# Patient Record
Sex: Male | Born: 1945 | Race: Black or African American | Hispanic: No | Marital: Married | Smoking: Never smoker
Health system: Southern US, Community
[De-identification: ages and names within clinical notes are randomized; demographics above are authoritative.]

## PROBLEM LIST (undated history)

## (undated) DIAGNOSIS — E785 Hyperlipidemia, unspecified: Secondary | ICD-10-CM

## (undated) DIAGNOSIS — N400 Enlarged prostate without lower urinary tract symptoms: Secondary | ICD-10-CM

## (undated) DIAGNOSIS — E559 Vitamin D deficiency, unspecified: Secondary | ICD-10-CM

## (undated) DIAGNOSIS — C61 Malignant neoplasm of prostate: Secondary | ICD-10-CM

## (undated) DIAGNOSIS — I10 Essential (primary) hypertension: Secondary | ICD-10-CM

## (undated) DIAGNOSIS — R55 Syncope and collapse: Secondary | ICD-10-CM

## (undated) DIAGNOSIS — M109 Gout, unspecified: Secondary | ICD-10-CM

## (undated) DIAGNOSIS — M1990 Unspecified osteoarthritis, unspecified site: Secondary | ICD-10-CM

## (undated) DIAGNOSIS — F4310 Post-traumatic stress disorder, unspecified: Secondary | ICD-10-CM

## (undated) DIAGNOSIS — K219 Gastro-esophageal reflux disease without esophagitis: Secondary | ICD-10-CM

## (undated) HISTORY — PX: PROSTATE SURGERY: SHX751

## (undated) HISTORY — PX: OTHER SURGICAL HISTORY: SHX169

---

## 2002-09-04 ENCOUNTER — Encounter: Payer: Self-pay | Admitting: Family Medicine

## 2002-09-04 ENCOUNTER — Encounter: Admission: RE | Admit: 2002-09-04 | Discharge: 2002-09-04 | Payer: Self-pay | Admitting: Family Medicine

## 2002-09-28 ENCOUNTER — Ambulatory Visit (HOSPITAL_COMMUNITY): Admission: RE | Admit: 2002-09-28 | Discharge: 2002-09-28 | Payer: Self-pay | Admitting: Orthopedic Surgery

## 2002-10-02 ENCOUNTER — Ambulatory Visit (HOSPITAL_COMMUNITY): Admission: RE | Admit: 2002-10-02 | Discharge: 2002-10-02 | Payer: Self-pay | Admitting: Orthopedic Surgery

## 2002-10-02 ENCOUNTER — Encounter: Payer: Self-pay | Admitting: Orthopedic Surgery

## 2009-03-02 ENCOUNTER — Emergency Department (HOSPITAL_COMMUNITY): Admission: EM | Admit: 2009-03-02 | Discharge: 2009-03-02 | Payer: Self-pay | Admitting: Emergency Medicine

## 2013-08-13 DIAGNOSIS — C61 Malignant neoplasm of prostate: Secondary | ICD-10-CM

## 2013-08-13 HISTORY — DX: Malignant neoplasm of prostate: C61

## 2013-08-13 HISTORY — PX: PROSTATE BIOPSY: SHX241

## 2013-12-29 ENCOUNTER — Inpatient Hospital Stay (HOSPITAL_COMMUNITY)
Admission: EM | Admit: 2013-12-29 | Discharge: 2013-12-31 | DRG: 813 | Disposition: A | Discharge status: Home / Self Care | Payer: Medicare Other | Attending: Internal Medicine | Admitting: Internal Medicine

## 2013-12-29 ENCOUNTER — Encounter (HOSPITAL_COMMUNITY): Payer: Self-pay | Admitting: Emergency Medicine

## 2013-12-29 DIAGNOSIS — R809 Proteinuria, unspecified: Secondary | ICD-10-CM

## 2013-12-29 DIAGNOSIS — F4310 Post-traumatic stress disorder, unspecified: Secondary | ICD-10-CM | POA: Diagnosis present

## 2013-12-29 DIAGNOSIS — E876 Hypokalemia: Secondary | ICD-10-CM | POA: Diagnosis present

## 2013-12-29 DIAGNOSIS — M1990 Unspecified osteoarthritis, unspecified site: Secondary | ICD-10-CM | POA: Diagnosis present

## 2013-12-29 DIAGNOSIS — E785 Hyperlipidemia, unspecified: Secondary | ICD-10-CM | POA: Diagnosis present

## 2013-12-29 DIAGNOSIS — R21 Rash and other nonspecific skin eruption: Secondary | ICD-10-CM

## 2013-12-29 DIAGNOSIS — I10 Essential (primary) hypertension: Secondary | ICD-10-CM | POA: Diagnosis present

## 2013-12-29 DIAGNOSIS — Z79899 Other long term (current) drug therapy: Secondary | ICD-10-CM

## 2013-12-29 DIAGNOSIS — R17 Unspecified jaundice: Secondary | ICD-10-CM | POA: Diagnosis present

## 2013-12-29 DIAGNOSIS — D692 Other nonthrombocytopenic purpura: Secondary | ICD-10-CM | POA: Diagnosis present

## 2013-12-29 DIAGNOSIS — M109 Gout, unspecified: Secondary | ICD-10-CM | POA: Diagnosis present

## 2013-12-29 DIAGNOSIS — E871 Hypo-osmolality and hyponatremia: Secondary | ICD-10-CM | POA: Diagnosis present

## 2013-12-29 DIAGNOSIS — K219 Gastro-esophageal reflux disease without esophagitis: Secondary | ICD-10-CM | POA: Diagnosis present

## 2013-12-29 DIAGNOSIS — R945 Abnormal results of liver function studies: Secondary | ICD-10-CM

## 2013-12-29 DIAGNOSIS — D696 Thrombocytopenia, unspecified: Secondary | ICD-10-CM | POA: Diagnosis present

## 2013-12-29 DIAGNOSIS — C61 Malignant neoplasm of prostate: Secondary | ICD-10-CM | POA: Diagnosis present

## 2013-12-29 DIAGNOSIS — R7989 Other specified abnormal findings of blood chemistry: Secondary | ICD-10-CM | POA: Diagnosis present

## 2013-12-29 DIAGNOSIS — N049 Nephrotic syndrome with unspecified morphologic changes: Secondary | ICD-10-CM | POA: Insufficient documentation

## 2013-12-29 HISTORY — DX: Unspecified osteoarthritis, unspecified site: M19.90

## 2013-12-29 HISTORY — DX: Gastro-esophageal reflux disease without esophagitis: K21.9

## 2013-12-29 HISTORY — DX: Post-traumatic stress disorder, unspecified: F43.10

## 2013-12-29 HISTORY — DX: Benign prostatic hyperplasia without lower urinary tract symptoms: N40.0

## 2013-12-29 HISTORY — DX: Gout, unspecified: M10.9

## 2013-12-29 HISTORY — DX: Essential (primary) hypertension: I10

## 2013-12-29 HISTORY — DX: Vitamin D deficiency, unspecified: E55.9

## 2013-12-29 HISTORY — DX: Hyperlipidemia, unspecified: E78.5

## 2013-12-29 HISTORY — DX: Syncope and collapse: R55

## 2013-12-29 HISTORY — DX: Malignant neoplasm of prostate: C61

## 2013-12-29 LAB — CBC WITH DIFFERENTIAL/PLATELET
BASOS PCT: 1 % (ref 0–1)
Basophils Absolute: 0 10*3/uL (ref 0.0–0.1)
EOS ABS: 0.2 10*3/uL (ref 0.0–0.7)
EOS PCT: 3 % (ref 0–5)
HCT: 38.3 % — ABNORMAL LOW (ref 39.0–52.0)
HEMOGLOBIN: 13.7 g/dL (ref 13.0–17.0)
LYMPHS ABS: 1.8 10*3/uL (ref 0.7–4.0)
Lymphocytes Relative: 21 % (ref 12–46)
MCH: 34.5 pg — AB (ref 26.0–34.0)
MCHC: 35.8 g/dL (ref 30.0–36.0)
MCV: 96.5 fL (ref 78.0–100.0)
MONO ABS: 1 10*3/uL (ref 0.1–1.0)
MONOS PCT: 12 % (ref 3–12)
NEUTROS PCT: 63 % (ref 43–77)
Neutro Abs: 5.5 10*3/uL (ref 1.7–7.7)
Platelets: 140 10*3/uL — ABNORMAL LOW (ref 150–400)
RBC: 3.97 MIL/uL — AB (ref 4.22–5.81)
RDW: 12.7 % (ref 11.5–15.5)
WBC: 8.5 10*3/uL (ref 4.0–10.5)

## 2013-12-29 LAB — COMPREHENSIVE METABOLIC PANEL
ALBUMIN: 3.2 g/dL — AB (ref 3.5–5.2)
ALK PHOS: 170 U/L — AB (ref 39–117)
ALT: 20 U/L (ref 0–53)
ANION GAP: 15 (ref 5–15)
AST: 48 U/L — ABNORMAL HIGH (ref 0–37)
BUN: 14 mg/dL (ref 6–23)
CALCIUM: 9.2 mg/dL (ref 8.4–10.5)
CO2: 27 mEq/L (ref 19–32)
CREATININE: 0.79 mg/dL (ref 0.50–1.35)
Chloride: 90 mEq/L — ABNORMAL LOW (ref 96–112)
GFR calc non Af Amer: >90 mL/min (ref >90)
GLUCOSE: 92 mg/dL (ref 70–99)
Potassium: 3 mEq/L — ABNORMAL LOW (ref 3.7–5.3)
Sodium: 132 mEq/L — ABNORMAL LOW (ref 137–147)
TOTAL PROTEIN: 9.1 g/dL — AB (ref 6.0–8.3)
Total Bilirubin: 1.7 mg/dL — ABNORMAL HIGH (ref 0.3–1.2)

## 2013-12-29 LAB — URINE MICROSCOPIC-ADD ON

## 2013-12-29 LAB — URINALYSIS, ROUTINE W REFLEX MICROSCOPIC
Glucose, UA: NEGATIVE mg/dL
Ketones, ur: NEGATIVE mg/dL
LEUKOCYTES UA: NEGATIVE
NITRITE: NEGATIVE
PH: 6 (ref 5.0–8.0)
Protein, ur: 30 mg/dL — AB
SPECIFIC GRAVITY, URINE: 1.01 (ref 1.005–1.030)
UROBILINOGEN UA: 4 mg/dL — AB (ref 0.0–1.0)

## 2013-12-29 LAB — CK: Total CK: 61 U/L (ref 7–232)

## 2013-12-29 LAB — HEPATITIS PANEL, ACUTE
HCV AB: NEGATIVE
Hep A IgM: NONREACTIVE
Hep B C IgM: NONREACTIVE
Hepatitis B Surface Ag: NEGATIVE

## 2013-12-29 LAB — MAGNESIUM: MAGNESIUM: 1.5 mg/dL (ref 1.5–2.5)

## 2013-12-29 LAB — VITAMIN B12: Vitamin B-12: 358 pg/mL (ref 211–911)

## 2013-12-29 LAB — TSH: TSH: 1.03 u[IU]/mL (ref 0.350–4.500)

## 2013-12-29 LAB — RHEUMATOID FACTOR: Rhuematoid fact SerPl-aCnc: <10 IU/mL (ref <=14)

## 2013-12-29 LAB — HIV ANTIBODY (ROUTINE TESTING W REFLEX): HIV: NONREACTIVE

## 2013-12-29 LAB — SEDIMENTATION RATE: SED RATE: 82 mm/h — AB (ref 0–16)

## 2013-12-29 LAB — RPR

## 2013-12-29 MED ORDER — ATORVASTATIN CALCIUM 80 MG PO TABS
80.0000 mg | ORAL_TABLET | Freq: Every day | ORAL | Status: DC
  Administered 2013-12-29: 80 mg via ORAL
  Filled 2013-12-29 (×3): qty 1

## 2013-12-29 MED ORDER — VERAPAMIL HCL 120 MG PO TABS
120.0000 mg | ORAL_TABLET | Freq: Every day | ORAL | Status: DC
  Administered 2013-12-30 – 2013-12-31 (×2): 120 mg via ORAL
  Filled 2013-12-29 (×2): qty 1

## 2013-12-29 MED ORDER — ALBUTEROL SULFATE (2.5 MG/3ML) 0.083% IN NEBU: 2.5000 mg | INHALATION_SOLUTION | RESPIRATORY_TRACT | Status: DC | PRN

## 2013-12-29 MED ORDER — FAMOTIDINE 20 MG PO TABS
20.0000 mg | ORAL_TABLET | Freq: Every day | ORAL | Status: DC
Start: 2013-12-29 — End: 2013-12-31
  Administered 2013-12-30 – 2013-12-31 (×2): 20 mg via ORAL
  Filled 2013-12-29 (×2): qty 1

## 2013-12-29 MED ORDER — SODIUM CHLORIDE 0.9 % IV SOLN: INTRAVENOUS | Status: DC

## 2013-12-29 MED ORDER — METHYLPREDNISOLONE SODIUM SUCC 125 MG IJ SOLR
125.0000 mg | INTRAMUSCULAR | Status: AC
  Administered 2013-12-29: 125 mg via INTRAVENOUS
  Filled 2013-12-29: qty 2

## 2013-12-29 MED ORDER — ACETAMINOPHEN 325 MG PO TABS: 650.0000 mg | ORAL_TABLET | Freq: Four times a day (QID) | ORAL | Status: DC | PRN

## 2013-12-29 MED ORDER — POTASSIUM CHLORIDE IN NACL 20-0.9 MEQ/L-% IV SOLN
INTRAVENOUS | Status: DC
  Administered 2013-12-29 – 2013-12-30 (×2): via INTRAVENOUS
  Filled 2013-12-29 (×5): qty 1000

## 2013-12-29 MED ORDER — VITAMIN B-1 100 MG PO TABS
100.0000 mg | ORAL_TABLET | Freq: Every day | ORAL | Status: DC
  Administered 2013-12-30 – 2013-12-31 (×2): 100 mg via ORAL
  Filled 2013-12-29 (×2): qty 1

## 2013-12-29 MED ORDER — POTASSIUM CHLORIDE CRYS ER 20 MEQ PO TBCR
30.0000 meq | EXTENDED_RELEASE_TABLET | Freq: Every day | ORAL | Status: AC
  Administered 2013-12-29: 30 meq via ORAL

## 2013-12-29 MED ORDER — DIPHENHYDRAMINE HCL 25 MG PO CAPS: 50.0000 mg | ORAL_CAPSULE | Freq: Every evening | ORAL | Status: DC | PRN

## 2013-12-29 MED ORDER — ONDANSETRON HCL 4 MG PO TABS: 4.0000 mg | ORAL_TABLET | Freq: Four times a day (QID) | ORAL | Status: DC | PRN

## 2013-12-29 MED ORDER — SODIUM CHLORIDE 0.9 % IV BOLUS (SEPSIS)
1000.0000 mL | Freq: Once | INTRAVENOUS | Status: AC
  Administered 2013-12-29: 1000 mL via INTRAVENOUS
  Filled 2013-12-29: qty 1000

## 2013-12-29 MED ORDER — ACETAMINOPHEN 650 MG RE SUPP: 650.0000 mg | Freq: Four times a day (QID) | RECTAL | Status: DC | PRN

## 2013-12-29 MED ORDER — POTASSIUM CHLORIDE CRYS ER 20 MEQ PO TBCR
20.0000 meq | EXTENDED_RELEASE_TABLET | Freq: Two times a day (BID) | ORAL | Status: DC
  Administered 2013-12-30 – 2013-12-31 (×3): 20 meq via ORAL
  Filled 2013-12-29 (×5): qty 1

## 2013-12-29 MED ORDER — MORPHINE SULFATE 4 MG/ML IJ SOLN: 4.0000 mg | INTRAMUSCULAR | Status: DC | PRN

## 2013-12-29 MED ORDER — ONDANSETRON HCL 4 MG/2ML IJ SOLN: 4.0000 mg | Freq: Four times a day (QID) | INTRAMUSCULAR | Status: DC | PRN

## 2013-12-29 MED ORDER — HYDROCODONE-ACETAMINOPHEN 5-325 MG PO TABS: 1.0000 | ORAL_TABLET | ORAL | Status: DC | PRN

## 2013-12-29 MED ORDER — ZOLPIDEM TARTRATE 5 MG PO TABS
5.0000 mg | ORAL_TABLET | Freq: Every evening | ORAL | Status: DC | PRN
  Administered 2013-12-30: 5 mg via ORAL
  Filled 2013-12-29: qty 1

## 2013-12-29 MED ORDER — SERTRALINE HCL 100 MG PO TABS
100.0000 mg | ORAL_TABLET | Freq: Every day | ORAL | Status: DC
  Administered 2013-12-30 – 2013-12-31 (×2): 100 mg via ORAL
  Filled 2013-12-29 (×5): qty 1

## 2013-12-29 MED ORDER — POTASSIUM CHLORIDE 10 MEQ/100ML IV SOLN
10.0000 meq | INTRAVENOUS | Status: AC
  Administered 2013-12-29: 10 meq via INTRAVENOUS
  Filled 2013-12-29: qty 100

## 2013-12-29 MED ORDER — ALUM & MAG HYDROXIDE-SIMETH 200-200-20 MG/5ML PO SUSP: 30.0000 mL | Freq: Four times a day (QID) | ORAL | Status: DC | PRN

## 2013-12-29 MED ORDER — PRAZOSIN HCL 2 MG PO CAPS
2.0000 mg | ORAL_CAPSULE | Freq: Every day | ORAL | Status: DC
  Administered 2013-12-29 – 2013-12-30 (×2): 2 mg via ORAL
  Filled 2013-12-29 (×5): qty 1

## 2013-12-29 NOTE — ED Notes (Signed)
Hospitalist at bedside 

## 2013-12-29 NOTE — ED Notes (Signed)
Called [facility] to check on status of Transfer to [place].  Per Phill, it may be around 7pm before a truck can be sent.  Asked by RN to call [facility] to see if they can send a truck.

## 2013-12-29 NOTE — H&P (Signed)
[facility] History and Physical  DEMETRIES COIA MAU:[id number] DOB: 1946/01/02 DOA: 12/29/2013  Referring physician: ED MD [place] PCP: Dr. Romero Liner, [place] office. Urologist: Dorthula Rue [place]    Chief Complaint: Rash  HPI: Steven Bradley is a 68 y.o. male with a history of prostate cancer, diagnosed earlier this year-treatment pending, hypertension, hyperlipidemia, and PTSD. He presents to the emergency department today with complaint of a diffuse rash on both of his legs. The rash started on his legs approximately 5 weeks ago. The rash is reddish purple and starts out in a circular formation and it itches and stings at times. A week ago, the rash improved in that it was not as red and some of the areas were regressing. However, 2 days ago, it worsened again and began to spread with circular areas on his thighs and some small areas on his arms and his back. He has had some discomfort in his legs, but no outright pain. He has had possible subjective fever and chills. He denies nausea, vomiting, diarrhea, pain with urination, chest pain, or shortness of breath. He denies changes in his detergents, soaps, or colognes. His new medications include vitamin D which she started a month or 2 ago and allopurinol which he started 2-3 months ago. He was also given a prescription for Norco for some discomfort from the rash. Apparently, both Lipitor and Zocor on his medication list and he believes he was taking both. He was also given a prescription for potassium chloride and magnesium sulfate several months ago as well. The patient believes that the rash could be coming from the vitamin D supplement.  In the ED, he is afebrile and hemodynamically stable. His lab data are significant for a platelet count of 140, sodium of 132, potassium of 3.0, and chloride of 90. He is being admitted for further evaluation and management. He will be transferred to [facility] for evaluation by Dermatology and  possible need for skin biopsy.     Review of Systems:  As above in history present illness. In addition, she has occasional shortness of breath, occasional dizziness, occasional anxiousness, occasional swelling in his ankles. Otherwise review of systems is negative.   Past Medical History  Diagnosis Date  . Enlarged prostate   . Hypertension   . Prostate cancer June 2015  . Arthritis   . Hyperlipidemia   . Vitamin D deficiency   . Syncope     Negative outpatient workup.  Marland Kitchen PTSD (post-traumatic stress disorder)   . Gout     Patient doesn't recall having gout but was prescribed allopurinol for ankle swelling.  Marland Kitchen GERD (gastroesophageal reflux disease)    Surgical history: He had a prostate biopsy in June 2015. He had removal of scrapnel from his head during the [place] war. He had an operation on his right hand. He has a history of her hernia operation.  Social History: He is married. He lives in [place]. He has 2 sons. 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 He denies tobacco and illicit drug use. He drinks 2-3 alcoholic beverages a day, but not daily. He denies any history of alcohol withdrawal syndrome or DTs.   No Known Allergies  Family history: His mother died of old age. His father died of colon cancer.  Prior to Admission medications   Medication Sig Start Date End Date Taking? Authorizing Provider  allopurinol (ZYLOPRIM) 300 MG tablet Take 300 mg by mouth daily.   Yes Historical Provider, MD  amLODipine (NORVASC) 10  MG tablet Take 10 mg by mouth daily.   Yes Historical Provider, MD  atorvastatin (LIPITOR) 80 MG tablet Take 80 mg by mouth at bedtime.   Yes Historical Provider, MD  diphenhydrAMINE (BENADRYL) 50 MG capsule Take 50 mg by mouth at bedtime as needed for sleep.   Yes Historical Provider, MD  esomeprazole (NEXIUM) 40 MG capsule Take 40 mg by mouth daily as needed (indigestion).   Yes Historical Provider, MD  hydrochlorothiazide (HYDRODIURIL) 25 MG tablet Take 25 mg by mouth  daily.   Yes Historical Provider, MD  HYDROcodone-acetaminophen (NORCO) 10-325 MG per tablet Take 0.5-1 tablets by mouth every 6 (six) hours as needed for moderate pain.   Yes Historical Provider, MD  Magnesium 400 MG TABS Take 400 mg by mouth daily.   Yes Historical Provider, MD  ondansetron (ZOFRAN) 8 MG tablet Take 4 mg by mouth every 8 (eight) hours as needed for nausea or vomiting.   Yes Historical Provider, MD  potassium chloride SA (K-DUR,KLOR-CON) 20 MEQ tablet Take 20 mEq by mouth 2 (two) times daily.   Yes Historical Provider, MD  prazosin (MINIPRESS) 2 MG capsule Take 2 mg by mouth at bedtime.   Yes Historical Provider, MD  ranitidine (ZANTAC) 150 MG capsule Take 150 mg by mouth daily as needed for heartburn.   Yes Historical Provider, MD  sertraline (ZOLOFT) 100 MG tablet Take 100 mg by mouth daily.   Yes Historical Provider, MD  sildenafil (VIAGRA) 100 MG tablet Take 100 mg by mouth daily as needed for erectile dysfunction.   Yes Historical Provider, MD  simvastatin (ZOCOR) 80 MG tablet Take 40-80 mg by mouth daily.   Yes Historical Provider, MD  verapamil (CALAN) 120 MG tablet Take 120 mg by mouth daily.   Yes Historical Provider, MD  Vitamin D, Ergocalciferol, (DRISDOL) 50000 UNITS CAPS capsule Take 50,000 Units by mouth every 7 (seven) days.   Yes Historical Provider, MD   Physical Exam: Filed Vitals:   12/29/13 1159 12/29/13 1203 12/29/13 1344 12/29/13 1600  BP: 127/85  122/84 139/84  Pulse: 88  72 88  Temp: 98.4 F (36.9 C)  98.2 F (36.8 C)   TempSrc: Oral  Oral   Resp: 18  18 24   Height:  6\' 6"  (1.981 m)    Weight:  97.523 kg (215 lb)    SpO2: 100%  100% 98%    Wt Readings from Last 3 Encounters:  12/29/13 97.523 kg (215 lb)    General:  Appears calm and comfortable; alert and oriented and in no acute distress. Eyes: PERRL, normal lids, irises & conjunctiva; conjunctivae are clear and sclerae are white. ENT: grossly normal hearing, lips & tongue; oropharynx  reveals mildly dry mucous membranes, no posterior exudates or erythema. Neck: no LAD, masses or thyromegaly Cardiovascular: RRR, no m/r/g. There is a trace of ankle edema right greater than left. Telemetry: Not applicable.  Respiratory: CTA bilaterally, no w/r/r. Normal respiratory effort. Abdomen: Positive bowel sounds, soft, nontender, nondistended. Skin: Multiple areas of circular and confluent macular purpuric rash over the pretibial areas bilaterally extending his dorsi; with a few circular macules on both of his thighs. There are scattered much smaller purpuric lesions on his arms and his back. Musculoskeletal: grossly normal tone BUE/BLE Psychiatric: grossly normal mood and affect, speech fluent and appropriate; pleasant affect. Neurologic: Cranial nerves II through XII are intact.           Labs on Admission:  Basic Metabolic Panel:  Recent Labs Lab  12/29/13 1249  NA 132*  K 3.0*  CL 90*  CO2 27  GLUCOSE 92  BUN 14  CREATININE 0.79  CALCIUM 9.2   Liver Function Tests:  Recent Labs Lab 12/29/13 1249  AST 48*  ALT 20  ALKPHOS 170*  BILITOT 1.7*  PROT 9.1*  ALBUMIN 3.2*   No results found for this basename: LIPASE, AMYLASE,  in the last 168 hours No results found for this basename: AMMONIA,  in the last 168 hours CBC:  Recent Labs Lab 12/29/13 1249  WBC 8.5  NEUTROABS 5.5  HGB 13.7  HCT 38.3*  MCV 96.5  PLT 140*   Cardiac Enzymes: No results found for this basename: CKTOTAL, CKMB, CKMBINDEX, TROPONINI,  in the last 168 hours  BNP (last 3 results) No results found for this basename: PROBNP,  in the last 8760 hours CBG: No results found for this basename: GLUCAP,  in the last 168 hours  Radiological Exams on Admission: No results found.  EKG: Independently reviewed. Not applicable.  Assessment/Plan Principal Problem:   Purpura Active Problems:   Hypokalemia   Hyponatremia   Thrombocytopenia   Essential hypertension   Elevated  LFTs   1. Purpura/purpuric rash. Per history, the rash started approximately 5 weeks ago. Etiology is unclear. Would consider drug-induced rash, but other etiologies will be entertained. There appears to be somewhat of a temporal relationship between the start of allopurinol and vitamin D and the rash. There also could be a temporal relationship with the start of potassium chloride and magnesium sulfate supplements. For further evaluation, we'll order HIV, RPR, viral hepatitis panel, sedimentation rate, CK, and rheumatoid factor. The patient is being transferred to [facility] for further evaluation. Would recommend dermatology evaluation/consultation and skin biopsy for definitive diagnosis and treatment. We'll discontinue allopurinol, magnesium, and vitamin D for now. He was given 125 mg of Solu-Medrol in the ED. We'll hold off on further steroid treatment until further consultation with dermatology. 2. Hypokalemia. This could be secondary to hydrochlorothiazide. Will supplement with 2 runs of IV potassium and oral potassium. We'll hold hydrochlorothiazide. We'll check his magnesium level. 3. Hyponatremia. This is likely secondary to mild hypovolemia. We'll start normal saline infusion. 4. Thrombocytopenia. His platelet count is slightly low. Etiology unknown, but will order a vitamin B12 level and TSH for further evaluation. 5. Mildly elevated LFTs. This could be secondary to statin therapy as the patient may have been taking both Lipitor and Zocor. The elevation could also be secondary to his alcohol use. Viral hepatitis panel ordered. We'll continue to monitor. 6. Alcohol use. The patient denies abuse. His significant other agrees with this. There is certainly no signs of alcohol withdrawal syndrome and he does not smell of alcohol. We'll start thiamine empirically. Continue to monitor.   Disposition: The patient is being transferred to [facility]; Dr. Karleen Hampshire is the accepting physician. Would  recommend dermatology consultation with or without a skin biopsy per dermatology's discretion. Would recommend followup of the laboratory studies pending.   Code Status: Full code DVT Prophylaxis: SCDs Family Communication: Discussed with his significant other (not his wife). Disposition Plan: As above.  Time spent: One hour and 10 minutes.  [facility] Pager [phone]

## 2013-12-29 NOTE — ED Notes (Signed)
[facility] here to transfer Pt to [facility].  I called [facility] back to let them know.  Spoke with Phill.

## 2013-12-29 NOTE — ED Notes (Signed)
ED secretary notifying [facility] for transfer at this time.

## 2013-12-29 NOTE — ED Notes (Signed)
Report given to [name], RN at [facility] for room [phone].

## 2013-12-29 NOTE — ED Notes (Signed)
MD at bedside. 

## 2013-12-29 NOTE — ED Notes (Signed)
Pt has rash on arms and legs x 2 weeks.

## 2013-12-29 NOTE — ED Provider Notes (Signed)
CSN: [id number]     Arrival date & time 12/29/13  1154 History  This chart was scribed for Carmin Muskrat, MD by Molli Posey, ED Scribe. This patient was seen in room APFT22/APFT22 and the patient's care was started 12:06 PM.    Chief Complaint  Patient presents with  . Rash     The history is provided by the patient. No language interpreter was used.   HPI Comments: Steven Bradley is a 68 y.o. male with a history of prostate CA and HTN who presents to the Emergency Department complaining of rash on bilateral legs and arms that started 5 weeks ago. Patient reports it started to improve then drastically worsened 2 days ago. He reports associated itchiness and states that it stings some. He reports no associated pain. Patient says he has some intermittent nausea but denies vomiting and diarrhea as symptoms.    Past Medical History  Diagnosis Date  . Enlarged prostate   . Hypertension   . Cancer     prostate  . Arthritis    History reviewed. No pertinent past surgical history. History reviewed. No pertinent family history. History  Substance Use Topics  . Smoking status: Never Smoker   . Smokeless tobacco: Not on file  . Alcohol Use: Yes    Review of Systems  Constitutional:       Per HPI, otherwise negative  HENT:       Per HPI, otherwise negative  Respiratory:       Per HPI, otherwise negative  Cardiovascular:       Per HPI, otherwise negative  Gastrointestinal: Positive for nausea. Negative for vomiting and diarrhea.  Endocrine:       Negative aside from HPI  Genitourinary:       Neg aside from HPI   Musculoskeletal:       Per HPI, otherwise negative  Skin: Positive for rash.  Neurological: Negative for syncope.      Allergies  Review of patient's allergies indicates no known allergies.  Home Medications   Prior to Admission medications   Not on File   BP 127/85  Pulse 88  Temp(Src) 98.4 F (36.9 C) (Oral)  Resp 18  Ht 6\' 6"  (1.981 m)  Wt 215  lb (97.523 kg)  BMI 24.85 kg/m2  SpO2 100% Physical Exam  Nursing note and vitals reviewed. Constitutional: He is oriented to person, place, and time. He appears well-developed. No distress.  HENT:  Head: Normocephalic and atraumatic.  Eyes: Conjunctivae and EOM are normal.  Cardiovascular: Normal rate, regular rhythm and normal heart sounds.   Pulmonary/Chest: Effort normal. No stridor. No respiratory distress. He has no wheezes. He has no rales.  Abdominal: He exhibits no distension. There is no tenderness.  Musculoskeletal: He exhibits no edema.  Swelling diffusely about the right ankle  Neurological: He is alert and oriented to person, place, and time.  Skin: Skin is warm and dry.  Notably abnormal skin findings, with multiple areas of petechiae, purpura, some with central denuded bullae. No active bleeding, discharge, drainage. Lesions are almost entirely in the lower extremities, there is trace erythema on the left lower abdomen  Psychiatric: He has a normal mood and affect.    ED Course  Procedures DIAGNOSTIC STUDIES: Oxygen Saturation is 100% on RA, normal by my interpretation.    COORDINATION OF CARE: 12:13 PM Discussed treatment plan with pt at bedside and pt agreed to plan.   Labs Review Labs Reviewed  CBC WITH DIFFERENTIAL -  Abnormal; Notable for the following:    RBC 3.97 (*)    HCT 38.3 (*)    MCH 34.5 (*)    Platelets 140 (*)    All other components within normal limits  COMPREHENSIVE METABOLIC PANEL - Abnormal; Notable for the following:    Sodium 132 (*)    Potassium 3.0 (*)    Chloride 90 (*)    Total Protein 9.1 (*)    Albumin 3.2 (*)    AST 48 (*)    Alkaline Phosphatase 170 (*)    Total Bilirubin 1.7 (*)    All other components within normal limits  URINALYSIS, ROUTINE W REFLEX MICROSCOPIC - Abnormal; Notable for the following:    Color, Urine AMBER (*)    Hgb urine dipstick SMALL (*)    Bilirubin Urine SMALL (*)    Protein, ur 30 (*)     Urobilinogen, UA 4.0 (*)    All other components within normal limits  URINE MICROSCOPIC-ADD ON         2:04 PM Patient's labs notable for nephrotic changes, hypokalemia, hypochloremia, hypoalbuminemia with hyperbilirubinemia. 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 The recorded information has been reviewed and is accurate.   This patient presents with worsening bilateral lower extremity wounds. Patient also has right ankle atraumatic swelling. 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 With multiple abnormalities, including hyperbilirubinemia, nephrotic changes, patient had initiation of fluid resuscitation, steroids, was admitted for further evaluation and management.     Carmin Muskrat, MD 12/29/13 [phone]

## 2013-12-30 ENCOUNTER — Inpatient Hospital Stay (HOSPITAL_COMMUNITY): Payer: Medicare Other

## 2013-12-30 DIAGNOSIS — R809 Proteinuria, unspecified: Secondary | ICD-10-CM

## 2013-12-30 LAB — BILIRUBIN, FRACTIONATED(TOT/DIR/INDIR)
Bilirubin, Direct: 0.7 mg/dL — ABNORMAL HIGH (ref 0.0–0.3)
Indirect Bilirubin: 0.7 mg/dL (ref 0.3–0.9)
Total Bilirubin: 1.4 mg/dL — ABNORMAL HIGH (ref 0.3–1.2)

## 2013-12-30 LAB — COMPREHENSIVE METABOLIC PANEL
ALK PHOS: 173 U/L — AB (ref 39–117)
ALT: 25 U/L (ref 0–53)
AST: 73 U/L — AB (ref 0–37)
Albumin: 2.7 g/dL — ABNORMAL LOW (ref 3.5–5.2)
Anion gap: 13 (ref 5–15)
BILIRUBIN TOTAL: 1.4 mg/dL — AB (ref 0.3–1.2)
BUN: 16 mg/dL (ref 6–23)
CHLORIDE: 97 meq/L (ref 96–112)
CO2: 22 meq/L (ref 19–32)
Calcium: 8.9 mg/dL (ref 8.4–10.5)
Creatinine, Ser: 0.62 mg/dL (ref 0.50–1.35)
GFR calc Af Amer: >90 mL/min (ref >90)
GFR calc non Af Amer: >90 mL/min (ref >90)
Glucose, Bld: 161 mg/dL — ABNORMAL HIGH (ref 70–99)
Potassium: 3.6 mEq/L — ABNORMAL LOW (ref 3.7–5.3)
SODIUM: 132 meq/L — AB (ref 137–147)
Total Protein: 8 g/dL (ref 6.0–8.3)

## 2013-12-30 LAB — CBC
HCT: 35.6 % — ABNORMAL LOW (ref 39.0–52.0)
Hemoglobin: 12.9 g/dL — ABNORMAL LOW (ref 13.0–17.0)
MCH: 34.4 pg — ABNORMAL HIGH (ref 26.0–34.0)
MCHC: 36.2 g/dL — ABNORMAL HIGH (ref 30.0–36.0)
MCV: 94.9 fL (ref 78.0–100.0)
PLATELETS: 142 10*3/uL — AB (ref 150–400)
RBC: 3.75 MIL/uL — AB (ref 4.22–5.81)
RDW: 12.5 % (ref 11.5–15.5)
WBC: 9.5 10*3/uL (ref 4.0–10.5)

## 2013-12-30 NOTE — Progress Notes (Signed)
[facility] PROGRESS NOTE  JAISHAWN WITZKE GPQ:[id number] DOB: [birth year] DOA: 12/29/2013 PCP: No primary provider on file.  Assessment/Plan: 68 y.o. male with a history of prostate cancer, diagnosed earlier this year-treatment pending, hypertension, hyperlipidemia, and PTSD. He presents to the emergency department today with complaint of a diffuse rash on both of his legs.  1. Purpuric rash. Per history, the rash started approximately 5 weeks ago. ? allopurinol and vitamin D related  -negative HIV, RPR, viral hepatitis panel; elevated ESR; rheumatoid factor neg; pend ANA -rash clearing, clinically improving per patient; need outpatient dermatology follow up     2. Hypokalemia, hyponatremia. Likely due to hydrochlorothiazide.  -replace recheck;  3. Abnormal LFTS, hyperbilirubinemia, Thrombocytopenia. -hepatitis negative; check liver US, fractionated bili; hold statins  4. Proteinuria, alb-protein gap; check SPEP, UPEP  5. Alcohol use. The patient denies abuse. No h/o withdrawals    Code Status: full Family Communication: d/w patient (indicate person spoken with, relationship, and if by phone, the number) Disposition Plan: home 24-48 hrs    Consultants:  none  Procedures:  none  Antibiotics:  noen (indicate start date, and stop date if known)  HPI/Subjective: alert  Objective: Filed Vitals:   12/30/13 0635  BP: 125/84  Pulse: 77  Temp: 97.8 F (36.6 C)  Resp: 16    Intake/Output Summary (Last 24 hours) at 12/30/13 1157 Last data filed at 12/30/13 0730  Gross per 24 hour  Intake      0 ml  Output    200 ml  Net   -200 ml   Filed Weights   12/29/13 1203 12/29/13 1914  Weight: 97.523 kg (215 lb) 96.525 kg (212 lb 12.8 oz)    Exam:   General:  alert  Cardiovascular: s1,s2 rrr  Respiratory: CTA BL  Abdomen: soft, nt,nd   Musculoskeletal: no LE edema   Data Reviewed: Basic Metabolic Panel:  Recent Labs Lab 12/29/13 1249 12/29/13 1621   NA 132*  --   K 3.0*  --   CL 90*  --   CO2 27  --   GLUCOSE 92  --   BUN 14  --   CREATININE 0.79  --   CALCIUM 9.2  --   MG  --  1.5   Liver Function Tests:  Recent Labs Lab 12/29/13 1249  AST 48*  ALT 20  ALKPHOS 170*  BILITOT 1.7*  PROT 9.1*  ALBUMIN 3.2*   No results found for this basename: LIPASE, AMYLASE,  in the last 168 hours No results found for this basename: AMMONIA,  in the last 168 hours CBC:  Recent Labs Lab 12/29/13 1249  WBC 8.5  NEUTROABS 5.5  HGB 13.7  HCT 38.3*  MCV 96.5  PLT 140*   Cardiac Enzymes:  Recent Labs Lab 12/29/13 1621  CKTOTAL 61   BNP (last 3 results) No results found for this basename: PROBNP,  in the last 8760 hours CBG: No results found for this basename: GLUCAP,  in the last 168 hours  No results found for this or any previous visit (from the past 240 hour(s)).   Studies: No results found.  Scheduled Meds: . atorvastatin  80 mg Oral QHS  . famotidine  20 mg Oral Daily  . potassium chloride SA  20 mEq Oral BID  . prazosin  2 mg Oral QHS  . sertraline  100 mg Oral Daily  . thiamine  100 mg Oral Daily  . verapamil  120 mg Oral Daily   Continuous Infusions: .  0.9 % NaCl with KCl 20 mEq / L 75 mL/hr at 12/30/13 1037    Principal Problem:   Purpura Active Problems:   Hypokalemia   Hyponatremia   Thrombocytopenia   Essential hypertension   Elevated LFTs    Time spent: >35 minutes     Kinnie Feil  [facility] Pager [phone]. If 7PM-7AM, please contact night-coverage at [email or web address], password [facility] 12/30/2013, 11:57 AM  LOS: 1 day

## 2013-12-31 DIAGNOSIS — D696 Thrombocytopenia, unspecified: Secondary | ICD-10-CM

## 2013-12-31 LAB — ANA: Anti Nuclear Antibody(ANA): NEGATIVE

## 2013-12-31 NOTE — Discharge Instructions (Signed)
Please follow up with primary care doctor in 1 week  Please follow up with dermatologist in 1 week

## 2013-12-31 NOTE — Discharge Summary (Signed)
Physician Discharge Summary  Steven Bradley XLK:[id number] DOB: [birth year] DOA: 12/29/2013  PCP: No primary provider on file.  Admit date: 12/29/2013 Discharge date: 12/31/2013  Time spent: >35 minutes  Recommendations for Outpatient Follow-up:   follow up with primary care doctor in 1 week   follow up with dermatologist in 1 week  Discharge Diagnoses:  Principal Problem:   Purpura Active Problems:   Hypokalemia   Hyponatremia   Thrombocytopenia   Essential hypertension   Elevated LFTs   Discharge Condition: stable   Diet recommendation: low sodium  Filed Weights   12/29/13 1203 12/29/13 1914  Weight: 97.523 kg (215 lb) 96.525 kg (212 lb 12.8 oz)    History of present illness:  68 y.o. male with a history of prostate cancer, diagnosed earlier this year-treatment pending, hypertension, hyperlipidemia, and PTSD. He presents to the emergency department today with complaint of a diffuse rash on both of his legs.   Hospital Course:  1. Purpuric rash. Per history, the rash started approximately 5 weeks ago. ? allopurinol and vitamin D related  -negative HIV, RPR, viral hepatitis panel; elevated ESR; rheumatoid factor neg; pend ANA  -rash clearing, significantly improved; d/w patient to follow up with dermatology as outpatient for possible biopsy; hold Vit D, allopurinol  2. Hypokalemia, hyponatremia. Likely due to hydrochlorothiazide. Hold hctz; BPp is stable of diuretics  3. Abnormal LFTS, hyperbilirubinemia, Thrombocytopenia. ? Alcohol related with statin effect  -hepatitis negative; hold statins; recommended to stop using etoh  -recheck LFTs in 1 week  4. Proteinuria, alb-protein gap; check SPEP, UPEP pend; cont outpatient follow up  5. Alcohol use. The patient denies abuse. No h/o withdrawals  6. Prostate CA patient is scheduled for surgery;    [place]: IMPRESSION:  Sonographic survey is negative for acute cholecystitis. No  cholelithiasis.  Coarsened echotexture of the  liver with trace perihepatic fluid, may  reflect changes of steatosis or other nonspecific medical liver  disease.    Procedures:  none (i.e. Studies not automatically included, echos, thoracentesis, etc; not x-rays)  Consultations:  none  Discharge Exam: Filed Vitals:   12/31/13 0505  BP: 136/78  Pulse: 62  Temp: 97.8 F (36.6 C)  Resp: 18    General: alert Cardiovascular: s1,s2 rrr Respiratory: CTA BL  Discharge Instructions  Discharge Instructions   Diet - low sodium heart healthy    Complete by:  As directed      Discharge instructions    Complete by:  As directed   Please follow up with primary care doctor in 1 week  Please follow up with dermatologist in 1 week     Increase activity slowly    Complete by:  As directed             Medication List    STOP taking these medications       allopurinol 300 MG tablet  Commonly known as:  ZYLOPRIM     atorvastatin 80 MG tablet  Commonly known as:  LIPITOR     hydrochlorothiazide 25 MG tablet  Commonly known as:  HYDRODIURIL     potassium chloride SA 20 MEQ tablet  Commonly known as:  K-DUR,KLOR-CON     simvastatin 80 MG tablet  Commonly known as:  ZOCOR     Vitamin D (Ergocalciferol) 50000 UNITS Caps capsule  Commonly known as:  DRISDOL      TAKE these medications       amLODipine 10 MG tablet  Commonly known as:  NORVASC  Take  10 mg by mouth daily.     diphenhydrAMINE 50 MG capsule  Commonly known as:  BENADRYL  Take 50 mg by mouth at bedtime as needed for sleep.     esomeprazole 40 MG capsule  Commonly known as:  NEXIUM  Take 40 mg by mouth daily as needed (indigestion).     HYDROcodone-acetaminophen 10-325 MG per tablet  Commonly known as:  NORCO  Take 0.5-1 tablets by mouth every 6 (six) hours as needed for moderate pain.     Magnesium 400 MG Tabs  Take 400 mg by mouth daily.     ondansetron 8 MG tablet  Commonly known as:  ZOFRAN  Take 4 mg by mouth every 8 (eight) hours as  needed for nausea or vomiting.     prazosin 2 MG capsule  Commonly known as:  MINIPRESS  Take 2 mg by mouth at bedtime.     ranitidine 150 MG capsule  Commonly known as:  ZANTAC  Take 150 mg by mouth daily as needed for heartburn.     sertraline 100 MG tablet  Commonly known as:  ZOLOFT  Take 100 mg by mouth daily.     sildenafil 100 MG tablet  Commonly known as:  VIAGRA  Take 100 mg by mouth daily as needed for erectile dysfunction.     verapamil 120 MG tablet  Commonly known as:  CALAN  Take 120 mg by mouth daily.       No Known Allergies    The results of significant diagnostics from this hospitalization (including imaging, microbiology, ancillary and laboratory) are listed below for reference.    Significant Diagnostic Studies: US Abdomen Complete  12/30/2013   CLINICAL DATA:  68 year old male with hyperbilirubinemia, transaminitis.  EXAM: ULTRASOUND ABDOMEN COMPLETE  COMPARISON:  No prior for comparison  FINDINGS: Gallbladder: No gallstones or wall thickening visualized. No sonographic Murphy sign noted. Gallbladder wall measures 2.5 mm and is uniform without straight a shin.  Common bile duct: Diameter: 4 mm-5 mm  Liver: No focal lesion identified on the sonographic survey. Coarsened echotexture of the liver parenchyma. Trace perihepatic fluid.  IVC: No abnormality visualized.  Pancreas: Visualized portion unremarkable.  Spleen: Size and appearance within normal limits.  Right Kidney: Length: 11.4 cm. Echogenicity within normal limits. No mass or hydronephrosis visualized.  Left Kidney: Length: 12.2 cm. Echogenicity within normal limits. No mass or hydronephrosis visualized. Lobulated contour.  Abdominal aorta: No aneurysm visualized.  Other findings: None.  IMPRESSION: Sonographic survey is negative for acute cholecystitis. No cholelithiasis.  Coarsened echotexture of the liver with trace perihepatic fluid, may reflect changes of steatosis or other nonspecific medical liver  disease.  Signed,  Dulcy Fanny. Earleen Newport, DO  [facility]  [facility]   Electronically Signed   By: Corrie Mckusick D.O.   On: 12/30/2013 17:54    Microbiology: No results found for this or any previous visit (from the past 240 hour(s)).   Labs: Basic Metabolic Panel:  Recent Labs Lab 12/29/13 1249 12/29/13 1621 12/30/13 1240  NA 132*  --  132*  K 3.0*  --  3.6*  CL 90*  --  97  CO2 27  --  22  GLUCOSE 92  --  161*  BUN 14  --  16  CREATININE 0.79  --  0.62  CALCIUM 9.2  --  8.9  MG  --  1.5  --    Liver Function Tests:  Recent Labs Lab 12/29/13 1249 12/30/13 1240  AST 48* 73*  ALT 20 25  ALKPHOS 170* 173*  BILITOT 1.7* 1.4*  1.4*  PROT 9.1* 8.0  ALBUMIN 3.2* 2.7*   No results found for this basename: LIPASE, AMYLASE,  in the last 168 hours No results found for this basename: AMMONIA,  in the last 168 hours CBC:  Recent Labs Lab 12/29/13 1249 12/30/13 1240  WBC 8.5 9.5  NEUTROABS 5.5  --   HGB 13.7 12.9*  HCT 38.3* 35.6*  MCV 96.5 94.9  PLT 140* 142*   Cardiac Enzymes:  Recent Labs Lab 12/29/13 1621  CKTOTAL 61   BNP: BNP (last 3 results) No results found for this basename: PROBNP,  in the last 8760 hours CBG: No results found for this basename: GLUCAP,  in the last 168 hours     Signed:  Kinnie Feil  [facility] 12/31/2013, 9:24 AM

## 2014-01-01 LAB — UIFE/LIGHT CHAINS/TP QN, 24-HR UR
ALPHA 1 UR: DETECTED — AB
ALPHA 2 UR: DETECTED — AB
Albumin, U: DETECTED
Beta, Urine: DETECTED — AB
GAMMA UR: DETECTED — AB
TOTAL PROTEIN, URINE-UPE24: 18 mg/dL (ref 5–25)

## 2014-01-01 LAB — PROTEIN ELECTROPHORESIS, SERUM
Albumin ELP: 38 % — ABNORMAL LOW (ref 55.8–66.1)
Alpha-1-Globulin: 8.6 % — ABNORMAL HIGH (ref 2.9–4.9)
Alpha-2-Globulin: 10.5 % (ref 7.1–11.8)
BETA 2: 10.7 % — AB (ref 3.2–6.5)
BETA GLOBULIN: 6.3 % (ref 4.7–7.2)
Gamma Globulin: 25.9 % — ABNORMAL HIGH (ref 11.1–18.8)
M-SPIKE, %: NOT DETECTED g/dL
Total Protein ELP: 7.1 g/dL (ref 6.0–8.3)

## 2014-02-04 ENCOUNTER — Emergency Department (HOSPITAL_COMMUNITY)
Admission: EM | Admit: 2014-02-04 | Discharge: 2014-02-05 | Disposition: A | Discharge status: Home / Self Care | Payer: Medicare Other | Attending: Emergency Medicine | Admitting: Emergency Medicine

## 2014-02-04 ENCOUNTER — Encounter (HOSPITAL_COMMUNITY): Payer: Self-pay

## 2014-02-04 DIAGNOSIS — Z8546 Personal history of malignant neoplasm of prostate: Secondary | ICD-10-CM | POA: Insufficient documentation

## 2014-02-04 DIAGNOSIS — Z9079 Acquired absence of other genital organ(s): Secondary | ICD-10-CM | POA: Insufficient documentation

## 2014-02-04 DIAGNOSIS — Z8639 Personal history of other endocrine, nutritional and metabolic disease: Secondary | ICD-10-CM | POA: Diagnosis not present

## 2014-02-04 DIAGNOSIS — Z87448 Personal history of other diseases of urinary system: Secondary | ICD-10-CM | POA: Insufficient documentation

## 2014-02-04 DIAGNOSIS — R319 Hematuria, unspecified: Secondary | ICD-10-CM | POA: Diagnosis present

## 2014-02-04 DIAGNOSIS — Z79899 Other long term (current) drug therapy: Secondary | ICD-10-CM | POA: Insufficient documentation

## 2014-02-04 DIAGNOSIS — K219 Gastro-esophageal reflux disease without esophagitis: Secondary | ICD-10-CM | POA: Insufficient documentation

## 2014-02-04 DIAGNOSIS — Z8739 Personal history of other diseases of the musculoskeletal system and connective tissue: Secondary | ICD-10-CM | POA: Insufficient documentation

## 2014-02-04 LAB — BASIC METABOLIC PANEL
Anion gap: 17 — ABNORMAL HIGH (ref 5–15)
BUN: 12 mg/dL (ref 6–23)
CO2: 20 meq/L (ref 19–32)
CREATININE: 1.37 mg/dL — AB (ref 0.50–1.35)
Calcium: 8.7 mg/dL (ref 8.4–10.5)
Chloride: 90 mEq/L — ABNORMAL LOW (ref 96–112)
GFR calc non Af Amer: 52 mL/min — ABNORMAL LOW (ref >90)
GFR, EST AFRICAN AMERICAN: 60 mL/min — AB (ref >90)
Glucose, Bld: 97 mg/dL (ref 70–99)
Potassium: 4.2 mEq/L (ref 3.7–5.3)
Sodium: 127 mEq/L — ABNORMAL LOW (ref 137–147)

## 2014-02-04 LAB — CBC WITH DIFFERENTIAL/PLATELET
Basophils Absolute: 0 10*3/uL (ref 0.0–0.1)
Basophils Relative: 0 % (ref 0–1)
Eosinophils Absolute: 0 10*3/uL (ref 0.0–0.7)
Eosinophils Relative: 0 % (ref 0–5)
HCT: 29.9 % — ABNORMAL LOW (ref 39.0–52.0)
Hemoglobin: 10.3 g/dL — ABNORMAL LOW (ref 13.0–17.0)
Lymphocytes Relative: 7 % — ABNORMAL LOW (ref 12–46)
Lymphs Abs: 1.8 10*3/uL (ref 0.7–4.0)
MCH: 32.3 pg (ref 26.0–34.0)
MCHC: 34.4 g/dL (ref 30.0–36.0)
MCV: 93.7 fL (ref 78.0–100.0)
Monocytes Absolute: 1.5 10*3/uL — ABNORMAL HIGH (ref 0.1–1.0)
Monocytes Relative: 6 % (ref 3–12)
Neutro Abs: 22 10*3/uL — ABNORMAL HIGH (ref 1.7–7.7)
Neutrophils Relative %: 87 % — ABNORMAL HIGH (ref 43–77)
Platelets: 349 10*3/uL (ref 150–400)
RBC: 3.19 MIL/uL — ABNORMAL LOW (ref 4.22–5.81)
RDW: 15 % (ref 11.5–15.5)
WBC Morphology: INCREASED
WBC: 25.3 10*3/uL — ABNORMAL HIGH (ref 4.0–10.5)

## 2014-02-04 MED ORDER — SODIUM CHLORIDE 0.9 % IV BOLUS (SEPSIS)
500.0000 mL | Freq: Once | INTRAVENOUS | Status: AC
  Administered 2014-02-05: 500 mL via INTRAVENOUS

## 2014-02-04 NOTE — ED Notes (Signed)
Patient family is concerned that the patient is bleeding and had not been taken back to the back to see a physician.

## 2014-02-04 NOTE — ED Notes (Signed)
Patient states he has prostate removed 3 weeks ago d/t prostate CA. Patient has indwelling foley catheter in place. Tonight patient states blood in drainage bad, and bleeding from around foley. Patient denies pain or urinary urgency.

## 2014-02-04 NOTE — ED Notes (Signed)
Family reports that the patient is feeling like he is going to pass out and also is nauseated at this time.

## 2014-02-04 NOTE — ED Provider Notes (Signed)
CSN: [id number]     Arrival date & time 02/04/14  2029 History  This chart was scribe for Veryl Speak, MD by Judithann Sauger, ED Scribe. The patient was seen in room APA04/APA04 and the patient's care was started at 11:40 PM.     Chief Complaint  Patient presents with  . Hematuria   Patient is a 69 y.o. male presenting with hematuria. The history is provided by the patient and the spouse. No language interpreter was used.  Hematuria This is a new problem. The current episode started 12 to 24 hours ago. The problem occurs constantly. The problem has been gradually worsening. Pertinent negatives include no abdominal pain. Nothing aggravates the symptoms. Nothing relieves the symptoms. He has tried nothing for the symptoms.   HPI Comments: Steven Bradley is a 68 y.o. male who presents to the Emergency Department complaining of hematuria onset today. Family member reports that he had a surgery to remove his prostate on January 15, 2014 in [place]. She reports that there is blood from the catheter into the bag. She reports associated low BP and cold chills. She denies any fever or abdominal pain.   Past Medical History  Diagnosis Date  . Enlarged prostate   . Hypertension   . Prostate cancer June 2015  . Arthritis   . Hyperlipidemia   . Vitamin D deficiency   . Syncope     Negative outpatient workup.  Marland Kitchen PTSD (post-traumatic stress disorder)   . Gout     Patient doesn't recall having gout but was prescribed allopurinol for ankle swelling.  Marland Kitchen GERD (gastroesophageal reflux disease)    Past Surgical History  Procedure Laterality Date  . Scrapnel removal from scalp      During [place] war  . Prostate biopsy  June 2015  . Right hand surgery    . Prostate surgery     History reviewed. No pertinent family history. History  Substance Use Topics  . Smoking status: Never Smoker   . Smokeless tobacco: Not on file  . Alcohol Use: Yes     Comment: occasionaly    Review of Systems   Constitutional: Positive for chills. Negative for fever.  Gastrointestinal: Negative for nausea, vomiting, abdominal pain and diarrhea.  Genitourinary: Positive for hematuria.  Musculoskeletal: Negative for back pain.      Allergies  Review of patient's allergies indicates no known allergies.  Home Medications   Prior to Admission medications   Medication Sig Start Date End Date Taking? Authorizing Provider  amLODipine (NORVASC) 10 MG tablet Take 10 mg by mouth daily.   Yes Historical Provider, MD  diphenhydrAMINE (BENADRYL) 50 MG capsule Take 50 mg by mouth at bedtime as needed for sleep.   Yes Historical Provider, MD  esomeprazole (NEXIUM) 40 MG capsule Take 40 mg by mouth daily as needed (indigestion).   Yes Historical Provider, MD  HYDROcodone-acetaminophen (NORCO) 10-325 MG per tablet Take 0.5-1 tablets by mouth every 6 (six) hours as needed for moderate pain.   Yes Historical Provider, MD  Magnesium 400 MG TABS Take 400 mg by mouth daily.   Yes Historical Provider, MD  ondansetron (ZOFRAN) 8 MG tablet Take 4 mg by mouth every 8 (eight) hours as needed for nausea or vomiting.   Yes Historical Provider, MD  prazosin (MINIPRESS) 2 MG capsule Take 2 mg by mouth at bedtime.   Yes Historical Provider, MD  ranitidine (ZANTAC) 150 MG capsule Take 150 mg by mouth daily as needed for heartburn.  Yes Historical Provider, MD  sertraline (ZOLOFT) 100 MG tablet Take 100 mg by mouth daily.   Yes Historical Provider, MD  sildenafil (VIAGRA) 100 MG tablet Take 100 mg by mouth daily as needed for erectile dysfunction.   Yes Historical Provider, MD  verapamil (CALAN) 120 MG tablet Take 120 mg by mouth daily.   Yes Historical Provider, MD   BP 72/46 mmHg  Pulse 134  Temp(Src) 97.5 F (36.4 C) (Oral)  Resp 20  Ht 6\' 6"  (1.981 m)  Wt 210 lb (95.255 kg)  BMI 24.27 kg/m2  SpO2 100% Physical Exam  Constitutional: He is oriented to person, place, and time. He appears well-developed and  well-nourished. No distress.  HENT:  Head: Normocephalic and atraumatic.  Mouth/Throat: Oropharynx is clear and moist.  Eyes: Conjunctivae and EOM are normal.  Neck: Neck supple. No tracheal deviation present.  Cardiovascular: Normal rate, regular rhythm and normal heart sounds.   Pulmonary/Chest: Effort normal. No respiratory distress.  Abdominal: There is no tenderness.  Genitourinary:  There is an indwelling foley catheter. There is gross blood in the tubing and bag.  Musculoskeletal: Normal range of motion.  Neurological: He is alert and oriented to person, place, and time.  Skin: Skin is warm and dry.  Psychiatric: He has a normal mood and affect. His behavior is normal.  Nursing note and vitals reviewed.   ED Course  Procedures (including critical care time) DIAGNOSTIC STUDIES: Oxygen Saturation is 100% on RA, normal by my interpretation.    COORDINATION OF CARE: 11:46 PM- Pt advised of plan for treatment and pt agrees.   Labs Review Labs Reviewed  CBC WITH DIFFERENTIAL  BASIC METABOLIC PANEL    Imaging Review No results found.   EKG Interpretation None      MDM   Final diagnoses:  None    Patient is a 68 year old male with history of prostatectomy approximately 3 weeks ago. He presents today with complaints of bleeding in his catheter bag. Patient appears clinically well and nontoxic. Workup was initiated including CBC, BMP. This revealed an elevated white count of 25,000. Although the patient was afebrile, blood cultures were obtained due to the white count. He was sent for a CT scan of the abdomen and pelvis which revealed several complex fluid collections within the lower abdomen, likely either postsurgical hematomas or possible abscess formation.  The patient had his prostatectomy performed at the [facility] in [place], [place]. I spoke with the on-call urologist, Morrie Sheldon, and discussed the case with him. Dr. Judithann Sheen has seen this patient in  clinic and is very familiar with his situation. He informed me he apparently had a urine leak after his prostate surgery and required a drain. He now has some sort of a ostomy bag to collect any extra fluid that is draining. There is minimal, if any drainage in this bag at present. After reviewing the laboratory studies and CT results with Dr. Judithann Sheen, it was his opinion that the patient would be appropriate for discharge and they would like to see him in clinic tomorrow. I informed the patient and significant other at bedside of this plan and they were initially comfortable with this.  While being discharged, the significant other pointed out that his blood pressure was low. The patient was resting very comfortably and asleep through most of the visit. His blood pressure was noted to be in the 79K systolic, however he did not appear toxic or septic. When he was stimulated, his blood pressures  returned to normal. She also repeatedly requested that his Foley catheter be irrigated due to the presence of blood. I specifically asked the urologist I spoke with whether I should do this or not. He informed me he would see the patient in a few hours and will take care of it.   Shortly after the discharge paperwork was given, the significant other decided that she wanted him transferred to the [facility]. When I asked the reason why, she informed me that she had other things to do this morning. I explained to her that convenience was not an indication for an ambulance transfer to another facility. I also explained that it would be much easier and efficient to keep the appointment at 8 AM that was given to me by the urologist I spoke with. She became upset with me and informed me she would "return tomorrow to take care of this". She would not elaborate on what she meant by this.  At this point, it was brought to my attention that this individual had been very difficult and demanding with the nursing staff.  I  personally performed the services described in this documentation, which was scribed in my presence. The recorded information has been reviewed and is accurate.    Veryl Speak, MD 02/05/14 [phone]

## 2014-02-05 ENCOUNTER — Emergency Department (HOSPITAL_COMMUNITY): Payer: Medicare Other

## 2014-02-05 NOTE — ED Notes (Signed)
Dr. Stark Jock stated he wanted to speak with [facility] hospital doctor before I irrigate foley. Family informed.

## 2014-02-05 NOTE — ED Notes (Signed)
Dr. Stark Jock stated to wait until CT is completed and resulted before irrigating foley.

## 2014-02-05 NOTE — Discharge Instructions (Signed)
Follow-up with the urology clinic tomorrow morning.  I have spoken with Dr. Judithann Sheen who is aware of your situation.   Hematuria Hematuria is blood in your urine. It can be caused by a bladder infection, kidney infection, prostate infection, kidney stone, or cancer of your urinary tract. Infections can usually be treated with medicine, and a kidney stone usually will pass through your urine. If neither of these is the cause of your hematuria, further workup to find out the reason may be needed. It is very important that you tell your health care provider about any blood you see in your urine, even if the blood stops without treatment or happens without causing pain. Blood in your urine that happens and then stops and then happens again can be a symptom of a very serious condition. Also, pain is not a symptom in the initial stages of many urinary cancers. HOME CARE INSTRUCTIONS   Drink lots of fluid, 3-4 quarts a day. If you have been diagnosed with an infection, cranberry juice is especially recommended, in addition to large amounts of water.  Avoid caffeine, tea, and carbonated beverages because they tend to irritate the bladder.  Avoid alcohol because it may irritate the prostate.  Take all medicines as directed by your health care provider.  If you were prescribed an antibiotic medicine, finish it all even if you start to feel better.  If you have been diagnosed with a kidney stone, follow your health care provider's instructions regarding straining your urine to catch the stone.  Empty your bladder often. Avoid holding urine for long periods of time.  After a bowel movement, women should cleanse front to back. Use each tissue only once.  Empty your bladder before and after sexual intercourse if you are a male. SEEK MEDICAL CARE IF:  You develop back pain.  You have a fever.  You have a feeling of sickness in your stomach (nausea) or vomiting.  Your symptoms are not better in 3  days. Return sooner if you are getting worse. SEEK IMMEDIATE MEDICAL CARE IF:   You develop severe vomiting and are unable to keep the medicine down.  You develop severe back or abdominal pain despite taking your medicines.  You begin passing a large amount of blood or clots in your urine.  You feel extremely weak or faint, or you pass out. MAKE SURE YOU:   Understand these instructions.  Will watch your condition.  Will get help right away if you are not doing well or get worse. Document Released: 03/01/2005 Document Revised: 07/16/2013 Document Reviewed: 10/30/2012 [facility] Patient Information 2015 [place], [place]. This information is not intended to replace advice given to you by your health care provider. Make sure you discuss any questions you have with your health care provider.

## 2014-02-10 LAB — CULTURE, BLOOD (ROUTINE X 2)
CULTURE: NO GROWTH
CULTURE: NO GROWTH

## 2014-03-28 ENCOUNTER — Emergency Department (HOSPITAL_COMMUNITY)
Admission: EM | Admit: 2014-03-28 | Discharge: 2014-03-28 | Disposition: A | Discharge status: Home / Self Care | Payer: Medicare Other | Attending: Emergency Medicine | Admitting: Emergency Medicine

## 2014-03-28 ENCOUNTER — Emergency Department (HOSPITAL_COMMUNITY): Payer: Medicare Other

## 2014-03-28 ENCOUNTER — Encounter (HOSPITAL_COMMUNITY): Payer: Self-pay | Admitting: Emergency Medicine

## 2014-03-28 DIAGNOSIS — R140 Abdominal distension (gaseous): Secondary | ICD-10-CM

## 2014-03-28 DIAGNOSIS — M1990 Unspecified osteoarthritis, unspecified site: Secondary | ICD-10-CM | POA: Insufficient documentation

## 2014-03-28 DIAGNOSIS — N3090 Cystitis, unspecified without hematuria: Secondary | ICD-10-CM | POA: Diagnosis not present

## 2014-03-28 DIAGNOSIS — I10 Essential (primary) hypertension: Secondary | ICD-10-CM | POA: Diagnosis not present

## 2014-03-28 DIAGNOSIS — R05 Cough: Secondary | ICD-10-CM | POA: Diagnosis not present

## 2014-03-28 DIAGNOSIS — Z79899 Other long term (current) drug therapy: Secondary | ICD-10-CM | POA: Insufficient documentation

## 2014-03-28 DIAGNOSIS — R079 Chest pain, unspecified: Secondary | ICD-10-CM | POA: Insufficient documentation

## 2014-03-28 DIAGNOSIS — K219 Gastro-esophageal reflux disease without esophagitis: Secondary | ICD-10-CM | POA: Insufficient documentation

## 2014-03-28 DIAGNOSIS — Z87438 Personal history of other diseases of male genital organs: Secondary | ICD-10-CM | POA: Diagnosis not present

## 2014-03-28 DIAGNOSIS — Z8639 Personal history of other endocrine, nutritional and metabolic disease: Secondary | ICD-10-CM | POA: Insufficient documentation

## 2014-03-28 DIAGNOSIS — R109 Unspecified abdominal pain: Secondary | ICD-10-CM | POA: Diagnosis present

## 2014-03-28 DIAGNOSIS — F4310 Post-traumatic stress disorder, unspecified: Secondary | ICD-10-CM | POA: Insufficient documentation

## 2014-03-28 DIAGNOSIS — R059 Cough, unspecified: Secondary | ICD-10-CM

## 2014-03-28 DIAGNOSIS — Z8546 Personal history of malignant neoplasm of prostate: Secondary | ICD-10-CM | POA: Insufficient documentation

## 2014-03-28 LAB — COMPREHENSIVE METABOLIC PANEL
ALBUMIN: 2.1 g/dL — AB (ref 3.5–5.2)
ALT: 13 U/L (ref 0–53)
AST: 37 U/L (ref 0–37)
Alkaline Phosphatase: 111 U/L (ref 39–117)
Anion gap: 6 (ref 5–15)
BILIRUBIN TOTAL: 1.3 mg/dL — AB (ref 0.3–1.2)
BUN: 8 mg/dL (ref 6–23)
CALCIUM: 8.6 mg/dL (ref 8.4–10.5)
CHLORIDE: 100 meq/L (ref 96–112)
CO2: 29 mmol/L (ref 19–32)
Creatinine, Ser: 1.06 mg/dL (ref 0.50–1.35)
GFR calc non Af Amer: 71 mL/min — ABNORMAL LOW (ref >90)
GFR, EST AFRICAN AMERICAN: 82 mL/min — AB (ref >90)
Glucose, Bld: 115 mg/dL — ABNORMAL HIGH (ref 70–99)
POTASSIUM: 3.8 mmol/L (ref 3.5–5.1)
SODIUM: 135 mmol/L (ref 135–145)
TOTAL PROTEIN: 8.1 g/dL (ref 6.0–8.3)

## 2014-03-28 LAB — URINALYSIS, ROUTINE W REFLEX MICROSCOPIC
GLUCOSE, UA: NEGATIVE mg/dL
KETONES UR: 15 mg/dL — AB
Nitrite: POSITIVE — AB
Protein, ur: 30 mg/dL — AB
Specific Gravity, Urine: 1.038 — ABNORMAL HIGH (ref 1.005–1.030)
Urobilinogen, UA: 2 mg/dL — ABNORMAL HIGH (ref 0.0–1.0)
pH: 5.5 (ref 5.0–8.0)

## 2014-03-28 LAB — CBC WITH DIFFERENTIAL/PLATELET
Basophils Absolute: 0.1 10*3/uL (ref 0.0–0.1)
Basophils Relative: 2 % — ABNORMAL HIGH (ref 0–1)
Eosinophils Absolute: 0.2 10*3/uL (ref 0.0–0.7)
Eosinophils Relative: 3 % (ref 0–5)
HCT: 33.9 % — ABNORMAL LOW (ref 39.0–52.0)
HEMOGLOBIN: 11.8 g/dL — AB (ref 13.0–17.0)
Lymphocytes Relative: 28 % (ref 12–46)
Lymphs Abs: 2 10*3/uL (ref 0.7–4.0)
MCH: 30.8 pg (ref 26.0–34.0)
MCHC: 34.8 g/dL (ref 30.0–36.0)
MCV: 88.5 fL (ref 78.0–100.0)
MONO ABS: 1 10*3/uL (ref 0.1–1.0)
Monocytes Relative: 15 % — ABNORMAL HIGH (ref 3–12)
NEUTROS PCT: 52 % (ref 43–77)
Neutro Abs: 3.8 10*3/uL (ref 1.7–7.7)
PLATELETS: 315 10*3/uL (ref 150–400)
RBC: 3.83 MIL/uL — ABNORMAL LOW (ref 4.22–5.81)
RDW: 15.9 % — ABNORMAL HIGH (ref 11.5–15.5)
WBC: 7.2 10*3/uL (ref 4.0–10.5)

## 2014-03-28 LAB — URINE MICROSCOPIC-ADD ON

## 2014-03-28 LAB — LIPASE, BLOOD: Lipase: 19 U/L (ref 11–59)

## 2014-03-28 MED ORDER — SODIUM CHLORIDE 0.9 % IV SOLN
INTRAVENOUS | Status: DC
  Administered 2014-03-28: 14:00:00 via INTRAVENOUS

## 2014-03-28 MED ORDER — IOHEXOL 300 MG/ML  SOLN
100.0000 mL | Freq: Once | INTRAMUSCULAR | Status: AC | PRN
  Administered 2014-03-28: 80 mL via INTRAVENOUS

## 2014-03-28 MED ORDER — IOHEXOL 300 MG/ML  SOLN: 25.0000 mL | INTRAMUSCULAR | Status: AC

## 2014-03-28 MED ORDER — ONDANSETRON HCL 4 MG/2ML IJ SOLN
4.0000 mg | Freq: Once | INTRAMUSCULAR | Status: AC
  Administered 2014-03-28: 4 mg via INTRAVENOUS
  Filled 2014-03-28: qty 2

## 2014-03-28 MED ORDER — NITROFURANTOIN MONOHYD MACRO 100 MG PO CAPS: 100.0000 mg | ORAL_CAPSULE | Freq: Two times a day (BID) | ORAL | Status: DC

## 2014-03-28 NOTE — Discharge Instructions (Signed)
Urinary Tract Infection Urinary tract infections (UTIs) can develop anywhere along your urinary tract. Your urinary tract is your body's drainage system for removing wastes and extra water. Your urinary tract includes two kidneys, two ureters, a bladder, and a urethra. Your kidneys are a pair of bean-shaped organs. Each kidney is about the size of your fist. They are located below your ribs, one on each side of your spine. CAUSES Infections are caused by microbes, which are microscopic organisms, including fungi, viruses, and bacteria. These organisms are so small that they can only be seen through a microscope. Bacteria are the microbes that most commonly cause UTIs. SYMPTOMS  Symptoms of UTIs may vary by age and gender of the patient and by the location of the infection. Symptoms in young women typically include a frequent and intense urge to urinate and a painful, burning feeling in the bladder or urethra during urination. Older women and men are more likely to be tired, shaky, and weak and have muscle aches and abdominal pain. A fever may mean the infection is in your kidneys. Other symptoms of a kidney infection include pain in your back or sides below the ribs, nausea, and vomiting. DIAGNOSIS To diagnose a UTI, your caregiver will ask you about your symptoms. Your caregiver also will ask to provide a urine sample. The urine sample will be tested for bacteria and white blood cells. White blood cells are made by your body to help fight infection. TREATMENT  Typically, UTIs can be treated with medication. Because most UTIs are caused by a bacterial infection, they usually can be treated with the use of antibiotics. The choice of antibiotic and length of treatment depend on your symptoms and the type of bacteria causing your infection. HOME CARE INSTRUCTIONS  If you were prescribed antibiotics, take them exactly as your caregiver instructs you. Finish the medication even if you feel better after you  have only taken some of the medication.  Drink enough water and fluids to keep your urine clear or pale yellow.  Avoid caffeine, tea, and carbonated beverages. They tend to irritate your bladder.  Empty your bladder often. Avoid holding urine for long periods of time.  Empty your bladder before and after sexual intercourse.  After a bowel movement, women should cleanse from front to back. Use each tissue only once. SEEK MEDICAL CARE IF:   You have back pain.  You develop a fever.  Your symptoms do not begin to resolve within 3 days. SEEK IMMEDIATE MEDICAL CARE IF:   You have severe back pain or lower abdominal pain.  You develop chills.  You have nausea or vomiting.  You have continued burning or discomfort with urination. MAKE SURE YOU:   Understand these instructions.  Will watch your condition.  Will get help right away if you are not doing well or get worse. Document Released: 12/09/2004 Document Revised: 08/31/2011 Document Reviewed: 04/09/2011 [facility] Patient Information 2015 [place], [place]. This information is not intended to replace advice given to you by your health care provider. Make sure you discuss any questions you have with your health care provider. Cirrhosis Cirrhosis is a condition of scarring of the liver which is caused when the liver has tried repairing itself following damage. This damage may come from a previous infection such as one of the forms of hepatitis (usually hepatitis C), or the damage may come from being injured by toxins. The main toxin that causes this damage is alcohol. The scarring of the liver from use  of alcohol is irreversible. That means the liver cannot return to normal even though alcohol is not used any more. The main danger of hepatitis C infection is that it may cause long-lasting (chronic) liver disease, and this also may lead to cirrhosis. This complication is progressive and irreversible. CAUSES  Prior to available blood  tests, hepatitis C could be contracted by blood transfusions. Since testing of blood has improved, this is now unlikely. This infection can also be contracted through intravenous drug use and the sharing of needles. It can also be contracted through sexual relationships. The injury caused by alcohol comes from too much use. It is not a few drinks that poison the liver, but years of misuse. Usually there will be some signs and symptoms early with scarring of the liver that suggest the development of better habits. Alcohol should never be used while using acetaminophen. A small dose of both taken together may cause irreversible damage to the liver. HOME CARE INSTRUCTIONS  There is no specific treatment for cirrhosis. However, there are things you can do to avoid making the condition worse.  Rest as needed.  Eat a well-balanced diet. Your caregiver can help you with suggestions.  Vitamin supplements including vitamins A, K, D, and thiamine can help.  A low-salt diet, water restriction, or diuretic medicine may be needed to reduce fluid retention.  Avoid alcohol. This can be extremely toxic if combined with acetaminophen.  Avoid drugs which are toxic to the liver. Some of these include isoniazid, methyldopa, acetaminophen, anabolic steroids (muscle-building drugs), erythromycin, and oral contraceptives (birth control pills). Check with your caregiver to make sure medicines you are presently taking will not be harmful.  Periodic blood tests may be required. Follow your caregiver's advice regarding the timing of these.  Milk thistle is an herbal remedy which does protect the liver against toxins. However, it will not help once the liver has been scarred. SEEK MEDICAL CARE IF:  You have increasing fatigue or weakness.  You develop swelling of the hands, feet, legs, or face.  You vomit bright red blood, or a coffee ground appearing material.  You have blood in your stools, or the stools turn black  and tarry.  You have a fever.  You develop loss of appetite, or have nausea and vomiting.  You develop jaundice.  You develop easy bruising or bleeding.  You have worsening of any of the problems you are concerned about. Document Released: 03/01/2005 Document Revised: 05/24/2011 Document Reviewed: 10/18/2007 [facility] Patient Information 2015 [place], [place]. This information is not intended to replace advice given to you by your health care provider. Make sure you discuss any questions you have with your health care provider.

## 2014-03-28 NOTE — ED Provider Notes (Signed)
  Physical Exam  BP 133/83 mmHg  Pulse 80  Temp(Src) 97.3 F (36.3 C) (Oral)  Resp 16  Ht 6\' 6"  (1.981 m)  Wt 200 lb 8 oz (90.946 kg)  BMI 23.17 kg/m2  SpO2 100%  Physical Exam  ED Course  Procedures  MDM 69 y.o. male with pertinent PMH of prostate ca sp resection presents with abd swelling.  Assumed care of pt from Dr. Rogene Houston.  CT scan returned with moderate volume ascites as well as signs of urinary tract infection. 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  Given strict return precautions for SBP, other abd pathology.  I have reviewed all laboratory and imaging studies if ordered as above  1. Cough   2. Abdominal distension           Debby Freiberg, MD 03/28/14 1752

## 2014-03-28 NOTE — ED Notes (Signed)
Pt c/o abd distention x 2 weeks; pt sts had recent sx for prostate CA; per family pt with cough as well

## 2014-03-28 NOTE — ED Notes (Signed)
Ct called when pt finished contrast 

## 2014-03-28 NOTE — ED Notes (Signed)
Pt returned to the unit from ct

## 2014-03-28 NOTE — ED Provider Notes (Addendum)
CSN: [id number]     Arrival date & time 03/28/14  1139 History   First MD Initiated Contact with Patient 03/28/14 1337     Chief Complaint  Patient presents with  . Abdominal Pain     (Consider location/radiation/quality/duration/timing/severity/associated sxs/prior Treatment) Patient is a 69 y.o. male presenting with abdominal pain. The history is provided by the patient.  Abdominal Pain Associated symptoms: chest pain   Associated symptoms: no dysuria, no fever, no nausea, no shortness of breath and no vomiting    patient with abdominal distention for 2 weeks but getting worse. Patient had prostate surgery done by the [place] [facility] in the fall. Patient's also had a cough of late. Patient without any abdominal pain there is some mild shortness of breath. No chest pain. Currently not undergoing any chemotherapy.  Past Medical History  Diagnosis Date  . Enlarged prostate   . Hypertension   . Prostate cancer June 2015  . Arthritis   . Hyperlipidemia   . Vitamin D deficiency   . Syncope     Negative outpatient workup.  Marland Kitchen PTSD (post-traumatic stress disorder)   . Gout     Patient doesn't recall having gout but was prescribed allopurinol for ankle swelling.  Marland Kitchen GERD (gastroesophageal reflux disease)    Past Surgical History  Procedure Laterality Date  . Scrapnel removal from scalp      During [place] war  . Prostate biopsy  June 2015  . Right hand surgery    . Prostate surgery     History reviewed. No pertinent family history. History  Substance Use Topics  . Smoking status: Never Smoker   . Smokeless tobacco: Not on file  . Alcohol Use: Yes     Comment: occasionaly    Review of Systems  Constitutional: Negative for fever.  Eyes: Negative for visual disturbance.  Respiratory: Negative for shortness of breath.   Cardiovascular: Positive for chest pain.  Gastrointestinal: Positive for abdominal pain and abdominal distention. Negative for nausea and vomiting.  Genitourinary:  Negative for dysuria.  Musculoskeletal: Negative for back pain.  Skin: Negative for rash.  Neurological: Negative for headaches.  Hematological: Does not bruise/bleed easily.  Psychiatric/Behavioral: Negative for confusion.      Allergies  Allopurinol and Vitamin d analogs  Home Medications   Prior to Admission medications   Medication Sig Start Date End Date Taking? Authorizing Provider  amLODipine (NORVASC) 10 MG tablet Take 10 mg by mouth daily.   Yes Historical Provider, MD  diphenhydrAMINE (BENADRYL) 50 MG capsule Take 50 mg by mouth daily as needed for sleep.    Yes Historical Provider, MD  esomeprazole (NEXIUM) 40 MG capsule Take 40 mg by mouth daily as needed (indigestion).   Yes Historical Provider, MD  HYDROcodone-acetaminophen (NORCO) 10-325 MG per tablet Take 1 tablet by mouth every 6 (six) hours as needed (general pain).    Yes Historical Provider, MD  Magnesium 400 MG TABS Take 400 mg by mouth daily.   Yes Historical Provider, MD  PRESCRIPTION MEDICATION Apply 1 application topically daily. Cream applied to arm for rash   Yes Historical Provider, MD  sertraline (ZOLOFT) 100 MG tablet Take 100 mg by mouth daily as needed (mood).    Yes Historical Provider, MD  sildenafil (VIAGRA) 100 MG tablet Take 50 mg by mouth See admin instructions. Patient takes once a day on Monday and thursdays   Yes Historical Provider, MD  verapamil (CALAN) 120 MG tablet Take 120 mg by mouth daily.  Yes Historical Provider, MD  ondansetron (ZOFRAN) 8 MG tablet Take 4 mg by mouth every 8 (eight) hours as needed for nausea or vomiting.    Historical Provider, MD   BP 121/88 mmHg  Pulse 79  Temp(Src) 97.3 F (36.3 C) (Oral)  Resp 20  Ht 6\' 6"  (1.981 m)  Wt 200 lb 8 oz (90.946 kg)  BMI 23.17 kg/m2  SpO2 100% Physical Exam  Constitutional: He is oriented to person, place, and time. He appears well-developed and well-nourished. No distress.  HENT:  Head: Normocephalic and atraumatic.   Mouth/Throat: Oropharynx is clear and moist.  Eyes: Conjunctivae and EOM are normal. Pupils are equal, round, and reactive to light.  Neck: Neck supple.  Cardiovascular: Normal rate, regular rhythm and normal heart sounds.   No murmur heard. Pulmonary/Chest: Effort normal and breath sounds normal. No respiratory distress.  Abdominal: Soft. Bowel sounds are normal. He exhibits distension. There is no tenderness.  Marked distention of the abdomen suggestive of fluid. No tenderness.  Musculoskeletal: Normal range of motion.  Neurological: He is alert and oriented to person, place, and time. No cranial nerve deficit. He exhibits normal muscle tone.  Skin: Skin is warm. No rash noted.  Nursing note and vitals reviewed.   ED Course  Procedures (including critical care time) Labs Review Labs Reviewed  CBC WITH DIFFERENTIAL - Abnormal; Notable for the following:    RBC 3.83 (*)    Hemoglobin 11.8 (*)    HCT 33.9 (*)    RDW 15.9 (*)    Monocytes Relative 15 (*)    Basophils Relative 2 (*)    All other components within normal limits  COMPREHENSIVE METABOLIC PANEL - Abnormal; Notable for the following:    Glucose, Bld 115 (*)    Albumin 2.1 (*)    Total Bilirubin 1.3 (*)    GFR calc non Af Amer 71 (*)    GFR calc Af Amer 82 (*)    All other components within normal limits  LIPASE, BLOOD  URINALYSIS, ROUTINE W REFLEX MICROSCOPIC   Results for orders placed or performed during the hospital encounter of 03/28/14  CBC with Differential  Result Value Ref Range   WBC 7.2 4.0 - 10.5 K/uL   RBC 3.83 (L) 4.22 - 5.81 MIL/uL   Hemoglobin 11.8 (L) 13.0 - 17.0 g/dL   HCT 33.9 (L) 39.0 - 52.0 %   MCV 88.5 78.0 - 100.0 fL   MCH 30.8 26.0 - 34.0 pg   MCHC 34.8 30.0 - 36.0 g/dL   RDW 15.9 (H) 11.5 - 15.5 %   Platelets 315 150 - 400 K/uL   Neutrophils Relative % 52 43 - 77 %   Neutro Abs 3.8 1.7 - 7.7 K/uL   Lymphocytes Relative 28 12 - 46 %   Lymphs Abs 2.0 0.7 - 4.0 K/uL   Monocytes  Relative 15 (H) 3 - 12 %   Monocytes Absolute 1.0 0.1 - 1.0 K/uL   Eosinophils Relative 3 0 - 5 %   Eosinophils Absolute 0.2 0.0 - 0.7 K/uL   Basophils Relative 2 (H) 0 - 1 %   Basophils Absolute 0.1 0.0 - 0.1 K/uL  Comprehensive metabolic panel  Result Value Ref Range   Sodium 135 135 - 145 mmol/L   Potassium 3.8 3.5 - 5.1 mmol/L   Chloride 100 96 - 112 mEq/L   CO2 29 19 - 32 mmol/L   Glucose, Bld 115 (H) 70 - 99 mg/dL   BUN 8  6 - 23 mg/dL   Creatinine, Ser 1.06 0.50 - 1.35 mg/dL   Calcium 8.6 8.4 - 10.5 mg/dL   Total Protein 8.1 6.0 - 8.3 g/dL   Albumin 2.1 (L) 3.5 - 5.2 g/dL   AST 37 0 - 37 U/L   ALT 13 0 - 53 U/L   Alkaline Phosphatase 111 39 - 117 U/L   Total Bilirubin 1.3 (H) 0.3 - 1.2 mg/dL   GFR calc non Af Amer 71 (L) >90 mL/min   GFR calc Af Amer 82 (L) >90 mL/min   Anion gap 6 5 - 15  Lipase, blood  Result Value Ref Range   Lipase 19 11 - 59 U/L     Imaging Review Dg Chest 2 View  03/28/2014   CLINICAL DATA:  Subacute cough. Initial encounter. History of prostate cancer.  EXAM: CHEST  2 VIEW  COMPARISON:  None.  FINDINGS: The cardiomediastinal silhouette is unremarkable.  Mild left basilar scarring is present.  There is no evidence of focal airspace disease, pulmonary edema, suspicious pulmonary nodule/mass, pleural effusion, or pneumothorax. No acute bony abnormalities are identified.  IMPRESSION: No active cardiopulmonary disease.   Electronically Signed   By: Hassan Rowan M.D.   On: 03/28/2014 15:04     EKG Interpretation None      MDM   Final diagnoses:  Abdominal distension    CT scan of the abdomen is pending. Patient was significant distention to the abdomen suggestive of ascites. No swelling to the legs. Patient with a history of prostate cancer and removal of the prostate gland. Patient not on any specific treatments for any metastatic cancer. Patient without any chest pain or shortness of breath. Family has noted a cough. The chest x-ray shows no signs  of pulmonary edema or pneumothorax or pneumonia. CT scan of the abdomen will further delineate disposition. Labs with a mild elevation in bilirubin. No significant anemia no significant leukocytosis. Platelets are normal. Patient's primary care doctors with the [place] [facility] in [place]. His prostate surgery was done at [facility].  Fredia Sorrow, MD 03/28/14 [place], MD 03/28/14 [phone]

## 2015-12-24 ENCOUNTER — Encounter (HOSPITAL_COMMUNITY): Payer: Self-pay

## 2015-12-24 ENCOUNTER — Emergency Department (HOSPITAL_COMMUNITY)
Admission: EM | Admit: 2015-12-24 | Discharge: 2015-12-25 | Disposition: A | Discharge status: Home / Self Care | Payer: Medicare Other | Attending: Emergency Medicine | Admitting: Emergency Medicine

## 2015-12-24 ENCOUNTER — Emergency Department (HOSPITAL_COMMUNITY): Payer: Medicare Other

## 2015-12-24 DIAGNOSIS — J111 Influenza due to unidentified influenza virus with other respiratory manifestations: Secondary | ICD-10-CM | POA: Insufficient documentation

## 2015-12-24 DIAGNOSIS — R52 Pain, unspecified: Secondary | ICD-10-CM | POA: Diagnosis present

## 2015-12-24 DIAGNOSIS — I10 Essential (primary) hypertension: Secondary | ICD-10-CM | POA: Diagnosis not present

## 2015-12-24 DIAGNOSIS — Z79899 Other long term (current) drug therapy: Secondary | ICD-10-CM | POA: Diagnosis not present

## 2015-12-24 DIAGNOSIS — R69 Illness, unspecified: Secondary | ICD-10-CM

## 2015-12-24 DIAGNOSIS — Z8546 Personal history of malignant neoplasm of prostate: Secondary | ICD-10-CM | POA: Insufficient documentation

## 2015-12-24 LAB — COMPREHENSIVE METABOLIC PANEL
ALT: 27 U/L (ref 17–63)
AST: 45 U/L — AB (ref 15–41)
Albumin: 4 g/dL (ref 3.5–5.0)
Alkaline Phosphatase: 201 U/L — ABNORMAL HIGH (ref 38–126)
Anion gap: 10 (ref 5–15)
BUN: 16 mg/dL (ref 6–20)
CHLORIDE: 100 mmol/L — AB (ref 101–111)
CO2: 24 mmol/L (ref 22–32)
CREATININE: 1.04 mg/dL (ref 0.61–1.24)
Calcium: 10.1 mg/dL (ref 8.9–10.3)
GFR calc Af Amer: >60 mL/min (ref >60)
GFR calc non Af Amer: >60 mL/min (ref >60)
Glucose, Bld: 110 mg/dL — ABNORMAL HIGH (ref 65–99)
POTASSIUM: 4.3 mmol/L (ref 3.5–5.1)
SODIUM: 134 mmol/L — AB (ref 135–145)
Total Bilirubin: 1 mg/dL (ref 0.3–1.2)
Total Protein: 9.2 g/dL — ABNORMAL HIGH (ref 6.5–8.1)

## 2015-12-24 LAB — CBC WITH DIFFERENTIAL/PLATELET
BASOS ABS: 0.1 10*3/uL (ref 0.0–0.1)
BASOS PCT: 1 %
EOS ABS: 0.1 10*3/uL (ref 0.0–0.7)
EOS PCT: 1 %
HEMATOCRIT: 35.9 % — AB (ref 39.0–52.0)
Hemoglobin: 12.9 g/dL — ABNORMAL LOW (ref 13.0–17.0)
Lymphocytes Relative: 14 %
Lymphs Abs: 1.4 10*3/uL (ref 0.7–4.0)
MCH: 33.1 pg (ref 26.0–34.0)
MCHC: 35.9 g/dL (ref 30.0–36.0)
MCV: 92.1 fL (ref 78.0–100.0)
MONO ABS: 1 10*3/uL (ref 0.1–1.0)
MONOS PCT: 10 %
NEUTROS ABS: 7.4 10*3/uL (ref 1.7–7.7)
Neutrophils Relative %: 74 %
PLATELETS: 271 10*3/uL (ref 150–400)
RBC: 3.9 MIL/uL — ABNORMAL LOW (ref 4.22–5.81)
RDW: 11.9 % (ref 11.5–15.5)
WBC: 10 10*3/uL (ref 4.0–10.5)

## 2015-12-24 LAB — URINALYSIS, ROUTINE W REFLEX MICROSCOPIC
Glucose, UA: NEGATIVE mg/dL
Hgb urine dipstick: NEGATIVE
Ketones, ur: NEGATIVE mg/dL
NITRITE: NEGATIVE
PH: 6 (ref 5.0–8.0)
Protein, ur: 30 mg/dL — AB
SPECIFIC GRAVITY, URINE: 1.023 (ref 1.005–1.030)

## 2015-12-24 LAB — URINE MICROSCOPIC-ADD ON

## 2015-12-24 MED ORDER — SODIUM CHLORIDE 0.9 % IV BOLUS (SEPSIS)
1000.0000 mL | Freq: Once | INTRAVENOUS | Status: AC
  Administered 2015-12-24: 1000 mL via INTRAVENOUS

## 2015-12-24 NOTE — ED Provider Notes (Signed)
[facility] DEPT Provider Note   CSN: [id number] Arrival date & time: 12/24/15  1721     History   Chief Complaint Chief Complaint  Patient presents with  . Generalized Body Aches    HPI Steven Bradley is a 70 y.o. male.  70 yo M with chief complaint of diffuse body aches low-grade fevers and nasal congestion. This been going on for the past couple weeks. Family thinks that agent orange has set in. They deny any cough, shortness of breath chest pain abdominal pain vomiting or diarrhea. They deny any dysuria. Deny any tick bites   The history is provided by the patient.  Illness  This is a new problem. The current episode started more than 1 week ago. The problem occurs constantly. The problem has not changed since onset.Pertinent negatives include no chest pain, no abdominal pain, no headaches and no shortness of breath. Nothing aggravates the symptoms. Nothing relieves the symptoms. He has tried nothing for the symptoms. The treatment provided no relief.    Past Medical History:  Diagnosis Date  . Arthritis   . Enlarged prostate   . GERD (gastroesophageal reflux disease)   . Gout    Patient doesn't recall having gout but was prescribed allopurinol for ankle swelling.  Marland Kitchen Hyperlipidemia   . Hypertension   . Prostate cancer [facility]) June 2015  . PTSD (post-traumatic stress disorder)   . Syncope    Negative outpatient workup.  . Vitamin D deficiency     Patient Active Problem List   Diagnosis Date Noted  . Nephrotic syndrome 12/29/2013  . 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 Hypokalemia 12/29/2013  . Hyponatremia 12/29/2013  . Thrombocytopenia ([place]) 12/29/2013  . Essential hypertension 12/29/2013  . Elevated LFTs 12/29/2013    Past Surgical History:  Procedure Laterality Date  . PROSTATE BIOPSY  June 2015  . PROSTATE SURGERY    . Right hand surgery    . Scrapnel removal from scalp     During [place] war       Home Medications    Prior to Admission medications     Medication Sig Start Date End Date Taking? Authorizing Provider  amLODipine (NORVASC) 10 MG tablet Take 10 mg by mouth daily.    Historical Provider, MD  diphenhydrAMINE (BENADRYL) 50 MG capsule Take 50 mg by mouth daily as needed for sleep.     Historical Provider, MD  esomeprazole (NEXIUM) 40 MG capsule Take 40 mg by mouth daily as needed (indigestion).    Historical Provider, MD  HYDROcodone-acetaminophen (NORCO) 10-325 MG per tablet Take 1 tablet by mouth every 6 (six) hours as needed (general pain).     Historical Provider, MD  Magnesium 400 MG TABS Take 400 mg by mouth daily.    Historical Provider, MD  nitrofurantoin, macrocrystal-monohydrate, (MACROBID) 100 MG capsule Take 1 capsule (100 mg total) by mouth 2 (two) times daily. X 7 days 03/28/14   Debby Freiberg, MD  ondansetron (ZOFRAN) 8 MG tablet Take 4 mg by mouth every 8 (eight) hours as needed for nausea or vomiting.    Historical Provider, MD  PRESCRIPTION MEDICATION Apply 1 application topically daily. Cream applied to arm for rash    Historical Provider, MD  sertraline (ZOLOFT) 100 MG tablet Take 100 mg by mouth daily as needed (mood).     Historical Provider, MD  sildenafil (VIAGRA) 100 MG tablet Take 50 mg by mouth See admin instructions. Patient takes once a day on Monday and thursdays    Historical  Provider, MD  verapamil (CALAN) 120 MG tablet Take 120 mg by mouth daily.    Historical Provider, MD    Family History History reviewed. No pertinent family history.  Social History Social History  Substance Use Topics  . Smoking status: Never Smoker  . Smokeless tobacco: Not on file  . Alcohol use Yes     Comment: occasionaly     Allergies   Allopurinol and Vitamin d analogs   Review of Systems Review of Systems  Constitutional: Positive for chills and fever (low grade).  HENT: Negative for congestion and facial swelling.   Eyes: Negative for discharge and visual disturbance.  Respiratory: Negative for shortness of  breath.   Cardiovascular: Negative for chest pain and palpitations.  Gastrointestinal: Negative for abdominal pain, diarrhea and vomiting.  Musculoskeletal: Positive for arthralgias and myalgias.  Skin: Negative for color change and rash.  Neurological: Negative for tremors, syncope and headaches.  Psychiatric/Behavioral: Negative for confusion and dysphoric mood.     Physical Exam Updated Vital Signs BP 105/84 (BP Location: Right Arm)   Pulse 84   Temp 98.6 F (37 C)   Resp 15   Ht 6\' 6"  (1.981 m)   Wt 196 lb (88.9 kg)   SpO2 98%   BMI 22.65 kg/m   Physical Exam  Constitutional: He is oriented to person, place, and time. He appears well-developed and well-nourished.  HENT:  Head: Normocephalic and atraumatic.  Eyes: EOM are normal. Pupils are equal, round, and reactive to light.  Neck: Normal range of motion. Neck supple. No JVD present.  Cardiovascular: Normal rate and regular rhythm.  Exam reveals no gallop and no friction rub.   No murmur heard. Pulmonary/Chest: No respiratory distress. He has no wheezes.  Abdominal: He exhibits no distension and no mass. There is no tenderness. There is no rebound and no guarding.  Musculoskeletal: Normal range of motion. He exhibits tenderness (tender with ROM to the left knee.  No noted effusion).  Neurological: He is alert and oriented to person, place, and time.  Skin: No rash noted. No pallor.  Psychiatric: He has a normal mood and affect. His behavior is normal.  Nursing note and vitals reviewed.    ED Treatments / Results  Labs (all labs ordered are listed, but only abnormal results are displayed) Labs Reviewed  CBC WITH DIFFERENTIAL/PLATELET  COMPREHENSIVE METABOLIC PANEL  URINALYSIS, ROUTINE W REFLEX MICROSCOPIC (NOT AT [facility])    EKG  EKG Interpretation None       Radiology No results found.  Procedures Procedures (including critical care time)  Medications Ordered in ED Medications  sodium chloride 0.9 %  bolus 1,000 mL (not administered)     Initial Impression / Assessment and Plan / ED Course  I have reviewed the triage vital signs and the nursing notes.  Pertinent labs & imaging results that were available during my care of the patient were reviewed by me and considered in my medical decision making (see chart for details).  Clinical Course    70 yo M With a chief complaint of diffuse muscle aches. Will obtain a basic laboratory evaluation chest x-ray, urine.   Final Clinical Impressions(s) / ED Diagnoses   Final diagnoses:  None    New Prescriptions New Prescriptions   No medications on file     Deno Etienne, DO 12/24/15 2144

## 2015-12-24 NOTE — ED Notes (Signed)
No respiratory or acute distress noted alert and oriented x 3 call light in reach visitor at bedside. 

## 2015-12-24 NOTE — Progress Notes (Signed)
Patient reports he is seen at the [facility] clinic in [place] and [place].  Patient reports he receives his medications in the mail for free from the [place].  System updated.

## 2015-12-24 NOTE — Discharge Instructions (Signed)
Take tylenol 2 pills 4 times a day and motrin 4 pills 3 times a day.  Drink plenty of fluids.  Return for worsening shortness of breath, headache, confusion. Follow up with your family doctor.   

## 2015-12-24 NOTE — ED Triage Notes (Signed)
Pt BIB [facility] c/o pain in his shoulders bilaterally. Pt also complaining of L knee, L ankle, and L hip pain that is worse with movement. Additionally, he reports intermittent fevers over the last few days. Afebrile at this time. Pt is ambulatory. Tolerating PO fluids, but reports loss of appetite. A&Ox4.

## 2016-06-01 ENCOUNTER — Encounter (HOSPITAL_COMMUNITY): Payer: Self-pay

## 2016-06-01 ENCOUNTER — Inpatient Hospital Stay (HOSPITAL_COMMUNITY): Payer: Medicare Other

## 2016-06-01 ENCOUNTER — Inpatient Hospital Stay (HOSPITAL_COMMUNITY)
Admission: EM | Admit: 2016-06-01 | Discharge: 2016-06-04 | DRG: 872 | Disposition: A | Discharge status: Home Health | Payer: Medicare Other | Attending: Internal Medicine | Admitting: Internal Medicine

## 2016-06-01 ENCOUNTER — Emergency Department (HOSPITAL_COMMUNITY): Payer: Medicare Other

## 2016-06-01 DIAGNOSIS — D696 Thrombocytopenia, unspecified: Secondary | ICD-10-CM | POA: Diagnosis present

## 2016-06-01 DIAGNOSIS — I10 Essential (primary) hypertension: Secondary | ICD-10-CM | POA: Diagnosis present

## 2016-06-01 DIAGNOSIS — R112 Nausea with vomiting, unspecified: Secondary | ICD-10-CM

## 2016-06-01 DIAGNOSIS — R6520 Severe sepsis without septic shock: Secondary | ICD-10-CM | POA: Diagnosis present

## 2016-06-01 DIAGNOSIS — N3000 Acute cystitis without hematuria: Secondary | ICD-10-CM | POA: Diagnosis present

## 2016-06-01 DIAGNOSIS — Z888 Allergy status to other drugs, medicaments and biological substances status: Secondary | ICD-10-CM | POA: Diagnosis not present

## 2016-06-01 DIAGNOSIS — Z79899 Other long term (current) drug therapy: Secondary | ICD-10-CM | POA: Diagnosis not present

## 2016-06-01 DIAGNOSIS — E162 Hypoglycemia, unspecified: Secondary | ICD-10-CM | POA: Diagnosis present

## 2016-06-01 DIAGNOSIS — N4889 Other specified disorders of penis: Secondary | ICD-10-CM | POA: Diagnosis present

## 2016-06-01 DIAGNOSIS — R7881 Bacteremia: Secondary | ICD-10-CM | POA: Diagnosis not present

## 2016-06-01 DIAGNOSIS — E876 Hypokalemia: Secondary | ICD-10-CM | POA: Diagnosis present

## 2016-06-01 DIAGNOSIS — E872 Acidosis: Secondary | ICD-10-CM | POA: Diagnosis present

## 2016-06-01 DIAGNOSIS — N390 Urinary tract infection, site not specified: Secondary | ICD-10-CM | POA: Diagnosis present

## 2016-06-01 DIAGNOSIS — E785 Hyperlipidemia, unspecified: Secondary | ICD-10-CM | POA: Diagnosis present

## 2016-06-01 DIAGNOSIS — Z8546 Personal history of malignant neoplasm of prostate: Secondary | ICD-10-CM | POA: Diagnosis not present

## 2016-06-01 DIAGNOSIS — K219 Gastro-esophageal reflux disease without esophagitis: Secondary | ICD-10-CM | POA: Diagnosis present

## 2016-06-01 DIAGNOSIS — E784 Other hyperlipidemia: Secondary | ICD-10-CM

## 2016-06-01 DIAGNOSIS — N10 Acute tubulo-interstitial nephritis: Secondary | ICD-10-CM | POA: Diagnosis present

## 2016-06-01 DIAGNOSIS — Z9079 Acquired absence of other genital organ(s): Secondary | ICD-10-CM

## 2016-06-01 DIAGNOSIS — A419 Sepsis, unspecified organism: Secondary | ICD-10-CM

## 2016-06-01 DIAGNOSIS — G43A0 Cyclical vomiting, not intractable: Secondary | ICD-10-CM | POA: Diagnosis not present

## 2016-06-01 DIAGNOSIS — B9689 Other specified bacterial agents as the cause of diseases classified elsewhere: Secondary | ICD-10-CM | POA: Diagnosis present

## 2016-06-01 LAB — CBC
HCT: 39.2 % (ref 39.0–52.0)
HEMATOCRIT: 33.5 % — AB (ref 39.0–52.0)
HEMOGLOBIN: 11.4 g/dL — AB (ref 13.0–17.0)
Hemoglobin: 13.4 g/dL (ref 13.0–17.0)
MCH: 32.9 pg (ref 26.0–34.0)
MCH: 33.1 pg (ref 26.0–34.0)
MCHC: 34.2 g/dL (ref 30.0–36.0)
MCHC: 34 g/dL (ref 30.0–36.0)
MCV: 96.3 fL (ref 78.0–100.0)
MCV: 97.4 fL (ref 78.0–100.0)
PLATELETS: 177 10*3/uL (ref 150–400)
Platelets: 138 10*3/uL — ABNORMAL LOW (ref 150–400)
RBC: 4.07 MIL/uL — AB (ref 4.22–5.81)
RBC: 3.44 MIL/uL — AB (ref 4.22–5.81)
RDW: 14.5 % (ref 11.5–15.5)
RDW: 14.9 % (ref 11.5–15.5)
WBC: 13.5 10*3/uL — ABNORMAL HIGH (ref 4.0–10.5)
WBC: 25.9 10*3/uL — ABNORMAL HIGH (ref 4.0–10.5)

## 2016-06-01 LAB — URINALYSIS, ROUTINE W REFLEX MICROSCOPIC
Bilirubin Urine: NEGATIVE
GLUCOSE, UA: NEGATIVE mg/dL
Ketones, ur: 5 mg/dL — AB
Nitrite: NEGATIVE
PH: 6 (ref 5.0–8.0)
Protein, ur: 30 mg/dL — AB
Specific Gravity, Urine: 1.01 (ref 1.005–1.030)

## 2016-06-01 LAB — CREATININE, SERUM: CREATININE: 0.99 mg/dL (ref 0.61–1.24)

## 2016-06-01 LAB — I-STAT CG4 LACTIC ACID, ED
LACTIC ACID, VENOUS: 4.47 mmol/L — AB (ref 0.5–1.9)
Lactic Acid, Venous: 4.67 mmol/L (ref 0.5–1.9)

## 2016-06-01 LAB — BASIC METABOLIC PANEL
Anion gap: 13 (ref 5–15)
BUN: 14 mg/dL (ref 6–20)
CALCIUM: 9 mg/dL (ref 8.9–10.3)
CO2: 23 mmol/L (ref 22–32)
Chloride: 103 mmol/L (ref 101–111)
Creatinine, Ser: 0.89 mg/dL (ref 0.61–1.24)
GFR calc Af Amer: >60 mL/min (ref >60)
Glucose, Bld: 95 mg/dL (ref 65–99)
Potassium: 3.3 mmol/L — ABNORMAL LOW (ref 3.5–5.1)
Sodium: 139 mmol/L (ref 135–145)

## 2016-06-01 LAB — CBG MONITORING, ED
GLUCOSE-CAPILLARY: 124 mg/dL — AB (ref 65–99)
Glucose-Capillary: 49 mg/dL — ABNORMAL LOW (ref 65–99)

## 2016-06-01 LAB — PROCALCITONIN: PROCALCITONIN: 30.92 ng/mL

## 2016-06-01 LAB — I-STAT TROPONIN, ED: Troponin i, poc: 0 ng/mL (ref 0.00–0.08)

## 2016-06-01 LAB — LACTIC ACID, PLASMA: Lactic Acid, Venous: 4.1 mmol/L (ref 0.5–1.9)

## 2016-06-01 MED ORDER — ONDANSETRON HCL 4 MG/2ML IJ SOLN: 4.0000 mg | Freq: Four times a day (QID) | INTRAMUSCULAR | Status: DC | PRN

## 2016-06-01 MED ORDER — PANTOPRAZOLE SODIUM 40 MG PO TBEC
40.0000 mg | DELAYED_RELEASE_TABLET | Freq: Every day | ORAL | Status: DC
  Administered 2016-06-02 – 2016-06-04 (×3): 40 mg via ORAL
  Filled 2016-06-01 (×3): qty 1

## 2016-06-01 MED ORDER — ATORVASTATIN CALCIUM 40 MG PO TABS
40.0000 mg | ORAL_TABLET | Freq: Every day | ORAL | Status: DC
  Administered 2016-06-01 – 2016-06-03 (×3): 40 mg via ORAL
  Filled 2016-06-01 (×3): qty 1

## 2016-06-01 MED ORDER — NAPHAZOLINE-GLYCERIN 0.012-0.2 % OP SOLN
1.0000 [drp] | Freq: Four times a day (QID) | OPHTHALMIC | Status: DC | PRN
  Filled 2016-06-01: qty 15

## 2016-06-01 MED ORDER — MORPHINE SULFATE (PF) 4 MG/ML IV SOLN
4.0000 mg | Freq: Once | INTRAVENOUS | Status: AC
  Administered 2016-06-01: 4 mg via INTRAVENOUS
  Filled 2016-06-01: qty 1

## 2016-06-01 MED ORDER — DEXTROSE 50 % IV SOLN
1.0000 | Freq: Once | INTRAVENOUS | Status: AC
  Administered 2016-06-01: 50 mL via INTRAVENOUS
  Filled 2016-06-01: qty 50

## 2016-06-01 MED ORDER — SODIUM CHLORIDE 0.9 % IV BOLUS (SEPSIS)
1000.0000 mL | Freq: Once | INTRAVENOUS | Status: AC
  Administered 2016-06-01: 1000 mL via INTRAVENOUS

## 2016-06-01 MED ORDER — SODIUM CHLORIDE 0.9 % IV SOLN: INTRAVENOUS | Status: DC

## 2016-06-01 MED ORDER — ONDANSETRON HCL 4 MG/2ML IJ SOLN
4.0000 mg | Freq: Once | INTRAMUSCULAR | Status: AC
  Administered 2016-06-01: 4 mg via INTRAVENOUS
  Filled 2016-06-01: qty 2

## 2016-06-01 MED ORDER — ACETAMINOPHEN 650 MG RE SUPP: 650.0000 mg | Freq: Four times a day (QID) | RECTAL | Status: DC | PRN

## 2016-06-01 MED ORDER — MAGNESIUM OXIDE 400 (241.3 MG) MG PO TABS
400.0000 mg | ORAL_TABLET | Freq: Every day | ORAL | Status: DC
  Administered 2016-06-02 – 2016-06-04 (×3): 400 mg via ORAL
  Filled 2016-06-01 (×3): qty 1

## 2016-06-01 MED ORDER — SODIUM CHLORIDE 0.9 % IV BOLUS (SEPSIS)
1000.0000 mL | Freq: Once | INTRAVENOUS | Status: AC
Start: 2016-06-01 — End: 2016-06-02
  Administered 2016-06-01: 1000 mL via INTRAVENOUS

## 2016-06-01 MED ORDER — DEXTROSE 5 % IV SOLN
2.0000 g | INTRAVENOUS | Status: DC
  Administered 2016-06-02 – 2016-06-04 (×3): 2 g via INTRAVENOUS
  Filled 2016-06-01 (×3): qty 2

## 2016-06-01 MED ORDER — ALBUTEROL SULFATE (2.5 MG/3ML) 0.083% IN NEBU: 2.5000 mg | INHALATION_SOLUTION | RESPIRATORY_TRACT | Status: DC | PRN

## 2016-06-01 MED ORDER — POTASSIUM CHLORIDE IN NACL 20-0.9 MEQ/L-% IV SOLN
INTRAVENOUS | Status: DC
  Administered 2016-06-01: 23:00:00 via INTRAVENOUS
  Filled 2016-06-01 (×2): qty 1000

## 2016-06-01 MED ORDER — MIRTAZAPINE 15 MG PO TABS
15.0000 mg | ORAL_TABLET | Freq: Every evening | ORAL | Status: DC | PRN
  Administered 2016-06-02: 15 mg via ORAL
  Filled 2016-06-01: qty 1

## 2016-06-01 MED ORDER — ACETAMINOPHEN 325 MG PO TABS
650.0000 mg | ORAL_TABLET | Freq: Four times a day (QID) | ORAL | Status: DC | PRN
  Administered 2016-06-02 – 2016-06-04 (×5): 650 mg via ORAL
  Filled 2016-06-01 (×5): qty 2

## 2016-06-01 MED ORDER — DEXTROSE 5 % IV SOLN: 2.0000 g | Freq: Once | INTRAVENOUS | Status: DC

## 2016-06-01 MED ORDER — SODIUM CHLORIDE 0.9 % IV BOLUS (SEPSIS)
2000.0000 mL | Freq: Once | INTRAVENOUS | Status: AC
  Administered 2016-06-01: 2000 mL via INTRAVENOUS

## 2016-06-01 MED ORDER — DEXTROSE 5 % IV SOLN
2.0000 g | Freq: Once | INTRAVENOUS | Status: AC
  Administered 2016-06-01: 2 g via INTRAVENOUS
  Filled 2016-06-01: qty 2

## 2016-06-01 MED ORDER — OXYCODONE HCL 5 MG PO TABS
5.0000 mg | ORAL_TABLET | ORAL | Status: DC | PRN
  Administered 2016-06-01 – 2016-06-02 (×2): 5 mg via ORAL
  Filled 2016-06-01 (×2): qty 1

## 2016-06-01 MED ORDER — SODIUM CHLORIDE 0.9 % IV SOLN
INTRAVENOUS | Status: DC
  Administered 2016-06-01: 16:00:00 via INTRAVENOUS

## 2016-06-01 MED ORDER — HEPARIN SODIUM (PORCINE) 5000 UNIT/ML IJ SOLN
5000.0000 [IU] | Freq: Three times a day (TID) | INTRAMUSCULAR | Status: DC
  Administered 2016-06-01 – 2016-06-04 (×8): 5000 [IU] via SUBCUTANEOUS
  Filled 2016-06-01 (×9): qty 1

## 2016-06-01 MED ORDER — ACETAMINOPHEN 325 MG PO TABS
650.0000 mg | ORAL_TABLET | Freq: Once | ORAL | Status: AC
  Administered 2016-06-01: 650 mg via ORAL
  Filled 2016-06-01: qty 2

## 2016-06-01 NOTE — Progress Notes (Signed)
Pharmacy Antibiotic Note  FAUSTO SAMPEDRO is a 71 y.o. male admitted on 06/01/2016 with UTI.  Pharmacy has been consulted for Rocephin dosing. Code sepsis patient.  Rocephin 2gm IV x1 ordered in ED.  Plan: Rocephin 2gm IV q24h No renal dose adjustment required.  Pharmacy to sign off.  Please re-consult if needed.   Height: 6\' 6"  (198.1 cm) Weight: 203 lb (92.1 kg) IBW/kg (Calculated) : 91.4  Temp (24hrs), Avg:99.2 F (37.3 C), Min:99.2 F (37.3 C), Max:99.2 F (37.3 C)   Recent Labs Lab 06/01/16 1040  WBC 13.5*  CREATININE 0.89    Estimated Creatinine Clearance: 101.3 mL/min (by C-G formula based on SCr of 0.89 mg/dL).    Allergies  Allergen Reactions  . Allopurinol Rash  . Vitamin D Analogs Rash    Antimicrobials this admission: 3/20 Rocephin >>   Dose adjustments this admission:  Microbiology results: 3/20 BCx: ordered 3/20 UCx: ordered   Thank you for allowing pharmacy to be a part of this patient's care.  Biagio Borg 06/01/2016 11:45 AM

## 2016-06-01 NOTE — H&P (Signed)
TRH H&P   Patient Demographics:    Steven Bradley, is a 71 y.o. male  MRN: [id number]   DOB - 07/13/1945  Admit Date - 06/01/2016  Outpatient Primary MD for the patient is [facility]  Referring MD/NP/PA: Dr Maryan Rued  Outpatient Specialists: Follows at [facility] and [facility]  Patient coming from: Home  Chief Complaint  Patient presents with  . Dizziness  . Penis Pain      HPI:    Steven Bradley  is a 71 y.o. male, With past medical history of hypertension, hyperlipidemia, GERD, prostate cancer status post prostatectomy, syncope, presents with complaints of fever, chills, vomiting and dysuria, patient reports he is been feeling ill for the last 3-4 days, reports generalized weakness, loss of appetite, dysuria, polyuria, increased urgency, drinks milk of his urine, lower back pain, penile pain, and over last 24 hours he developed nausea and vomiting, reports having fever and chills at home, workup significant for positive urinalysis, leukocytosis, tachycardia, reports recent UTI couple month ago, he was started on IV Rocephin, IV fluids, he has elevated lactic acid, I was called to admit.    Review of systems:    In addition to the HPI above,  reports Fever-chills, No Headache, No changes with Vision or hearing, No problems swallowing food or Liquids, No Chest pain, Cough or Shortness of Breath, No Abdominal pain, reports Nausea and  Vommitting, Bowel movements are regular, No Blood in stool , reports dysuria,polyuria and urgency, No new skin rashes or bruises, No new joints pains-aches,  No new focal weakness, tingling, numbness in any extremity,reports generalized weakness No recent weight gain or loss, No polyuria, polydypsia or polyphagia, No significant Mental Stressors.  A full 10 point Review of Systems was done, except as stated above, all other Review of  Systems were negative.   With Past History of the following :    Past Medical History:  Diagnosis Date  . Arthritis   . Enlarged prostate   . GERD (gastroesophageal reflux disease)   . Gout    Patient doesn't recall having gout but was prescribed allopurinol for ankle swelling.  Marland Kitchen Hyperlipidemia   . Hypertension   . Prostate cancer [facility]) June 2015  . PTSD (post-traumatic stress disorder)   . Syncope    Negative outpatient workup.  . Vitamin D deficiency       Past Surgical History:  Procedure Laterality Date  . PROSTATE BIOPSY  June 2015  . PROSTATE SURGERY    . Right hand surgery    . Scrapnel removal from scalp     During [place] war      Social History:     Social History  Substance Use Topics  . Smoking status: Never Smoker  . Smokeless tobacco: Never Used  . Alcohol use Yes     Comment: occasionaly     Lives - At home  Mobility -  independent    Family History :   Was reviewed and noncontributory   Home Medications:   Prior to Admission medications   Medication Sig Start Date End Date Taking? Authorizing Provider  amLODipine (NORVASC) 10 MG tablet Take 10 mg by mouth daily as needed (for BP >150).    Yes Historical Provider, MD  atorvastatin (LIPITOR) 80 MG tablet Take 40 mg by mouth at bedtime.   Yes Historical Provider, MD  furosemide (LASIX) 40 MG tablet Take 40 mg by mouth daily.   Yes Historical Provider, MD  HYDROcodone-acetaminophen (NORCO) 10-325 MG per tablet Take 1 tablet by mouth every 12 (twelve) hours as needed for moderate pain.    Yes Historical Provider, MD  magnesium oxide (MAGNESIUM-OXIDE) 400 (241.3 Mg) MG tablet Take 400 mg by mouth daily.   Yes Historical Provider, MD  mirtazapine (REMERON) 15 MG tablet Take 15 mg by mouth at bedtime as needed (for sleep/mood).   Yes Historical Provider, MD  omeprazole (PRILOSEC) 20 MG capsule Take 20 mg by mouth daily as needed (for acid reflex).   Yes Historical Provider, MD    Tetrahydroz-Dextran-PEG-Povid (VISINE ADVANCED RELIEF) 0.05-0.1-1-1 % SOLN Place 1 drop into both eyes daily as needed (for irritated eyes).    Yes Historical Provider, MD     Allergies:     Allergies  Allergen Reactions  . Allopurinol Rash  . Vitamin D Analogs Rash     Physical Exam:   Vitals  Blood pressure 123/84, pulse 97, temperature 98.9 F (37.2 C), temperature source Oral, resp. rate (!) 21, height 6\' 6"  (1.981 m), weight 92.1 kg (203 lb), SpO2 100 %.   1. General Well-developed elderly male lying in bed in mild discomfort  2. Normal affect and insight, Not Suicidal or Homicidal, Awake Alert, Oriented X 3.  3. No F.N deficits, ALL C.Nerves Intact, Strength 5/5 all 4 extremities, Sensation intact all 4 extremities, Plantars down going.  4. Ears and Eyes appear Normal, Conjunctivae clear, PERRLA. Moist Oral Mucosa.  5. Supple Neck, No JVD, No cervical lymphadenopathy appriciated, No Carotid Bruits.  6. Symmetrical Chest wall movement, Good air movement bilaterally, CTAB.  7. RRR, No Gallops, Rubs or Murmurs, No Parasternal Heave.  8. Positive Bowel Sounds, Abdomen Soft, No tenderness, No CVA tenderness,No rebound -guarding or rigidity.  9.  No Cyanosis, Normal Skin Turgor, No Skin Rash or Bruise.  10. Good muscle tone,  joints appear normal , no effusions, Normal ROM.  11. No Palpable Lymph Nodes in Neck or Axillae    Data Review:    CBC  Recent Labs Lab 06/01/16 1040  WBC 13.5*  HGB 13.4  HCT 39.2  PLT 177  MCV 96.3  MCH 32.9  MCHC 34.2  RDW 14.5   ------------------------------------------------------------------------------------------------------------------  Chemistries   Recent Labs Lab 06/01/16 1040  NA 139  K 3.3*  CL 103  CO2 23  GLUCOSE 95  BUN 14  CREATININE 0.89  CALCIUM 9.0   ------------------------------------------------------------------------------------------------------------------ estimated creatinine clearance  is 101.3 mL/min (by C-G formula based on SCr of 0.89 mg/dL). ------------------------------------------------------------------------------------------------------------------ No results for input(s): TSH, T4TOTAL, T3FREE, THYROIDAB in the last 72 hours.  Invalid input(s): FREET3  Coagulation profile No results for input(s): INR, PROTIME in the last 168 hours. ------------------------------------------------------------------------------------------------------------------- No results for input(s): DDIMER in the last 72 hours. -------------------------------------------------------------------------------------------------------------------  Cardiac Enzymes No results for input(s): CKMB, TROPONINI, MYOGLOBIN in the last 168 hours.  Invalid input(s): CK ------------------------------------------------------------------------------------------------------------------ No results found for: BNP   ---------------------------------------------------------------------------------------------------------------  Urinalysis  Component Value Date/Time   COLORURINE YELLOW 06/01/2016 1215   APPEARANCEUR HAZY (A) 06/01/2016 1215   LABSPEC 1.010 06/01/2016 1215   PHURINE 6.0 06/01/2016 1215   GLUCOSEU NEGATIVE 06/01/2016 1215   HGBUR MODERATE (A) 06/01/2016 1215   BILIRUBINUR NEGATIVE 06/01/2016 1215   KETONESUR 5 (A) 06/01/2016 1215   PROTEINUR 30 (A) 06/01/2016 1215   UROBILINOGEN 2.0 (H) 03/28/2014 1624   NITRITE NEGATIVE 06/01/2016 1215   LEUKOCYTESUR LARGE (A) 06/01/2016 1215    ----------------------------------------------------------------------------------------------------------------   Imaging Results:    Dg Chest Port 1 View  Result Date: 06/01/2016 CLINICAL DATA:  Fever, shortness of breath, tachycardia EXAM: PORTABLE CHEST 1 VIEW COMPARISON:  12/24/2015 FINDINGS: Heart and mediastinal contours are within normal limits. No focal opacities or effusions. No acute bony  abnormality. IMPRESSION: No active disease. Electronically Signed   By: Rolm Baptise M.D.   On: 06/01/2016 11:35    My personal review of EKG: Rhythm NSR, Rate  119 /min, QTc 453 , no Acute ST changes   Assessment & Plan:    Active Problems:   Hypokalemia   Essential hypertension   UTI (urinary tract infection)   Hyperlipemia   GERD (gastroesophageal reflux disease)   Nausea and vomiting   Sepsis secondary to UTI - Presents with leukocytosis, tachycardia and elevated lactic acid, blood cultures were sent, positive urinalysis, No active disease on chest x-ray, continue with IV Rocephin, [place] pro-calcitonin and lactic acid, continue with IV fluids, given second episode of UTI couple month, we'll check CT renal stone protocol  Hypertension - Given soft blood pressure, will continue to hold antihypertensive medication  Hypokalemia - Repleted with IV fluids given nausea and vomiting, recheck in a.m.  Nausea and vomiting - Secondary to sepsis and UTI, continue with when necessary Zofran, will start on clear liquid diet and advance as tolerated  GERD - Continue with PPI  Hyperlipidemia - Continue with statin   DVT Prophylaxis Heparin -  SCDs  AM Labs Ordered, also please review Full Orders  Family Communication: Admission, patients condition and plan of care including tests being ordered have been discussed with the patient and wife who indicate understanding and agree with the plan and Code Status.  Code Status Full  Likely DC to  Home  Condition GUARDED    Consults called: None  Admission status: inpatient  Time spent in minutes :65 minutes   Ashauna Bertholf M.D on 06/01/2016 at 6:21 PM  Between 7am to 7pm - Pager - [phone]. After 7pm go to [email or web address] - password [facility]  [facility] - Office  [phone]

## 2016-06-01 NOTE — ED Triage Notes (Signed)
Per report: Pt lives at home with wife.  Has had n/v w/ dizziness and penile pain for last few days, however, it's chronic as well.  C/O constant penile pain.  Has hx of frequent UTI's.  Wife placed condom cath PTA.  By EMS: orthostatics: 180/120 lying, 140/90 standing.  Became dizzy when standing.

## 2016-06-01 NOTE — ED Notes (Signed)
Bed: WTR6 Expected date:  Expected time:  Means of arrival:  Comments: 

## 2016-06-01 NOTE — ED Provider Notes (Addendum)
[facility] DEPT Provider Note   CSN: [id number] Arrival date & time: 06/01/16  1003     History   Chief Complaint Chief Complaint  Patient presents with  . Dizziness  . Penis Pain    HPI Steven Bradley is a 71 y.o. male.  Patient is a 71 year old male with a history of hypertension, hyperlipidemia, syncope and prostate cancer presenting today with worsening back pain, penile pain, dysuria. Patient states for the last 2-3 days he's had urinary frequency, urgency and dysuria. Wife states his urine has smelled strongly but today he developed significant pain in his lower back and lower abdomen. He has had several episodes of vomiting today and continues to have nausea. Also feel that the fever started today.  Patient also states that in the last few days he has felt lightheaded with standing and trying to walk. He has not had any episodes of syncope. Wife states his blood pressure was elevated this morning so she did give him his blood pressure medication. Last urinary tract infection was approximately 2 months ago. Patient denies any cough, shortness of breath or chest pain. No known cardiac history.   The history is provided by the patient.  Penis Pain     Past Medical History:  Diagnosis Date  . Arthritis   . Enlarged prostate   . GERD (gastroesophageal reflux disease)   . Gout    Patient doesn't recall having gout but was prescribed allopurinol for ankle swelling.  Marland Kitchen Hyperlipidemia   . Hypertension   . Prostate cancer [facility]) June 2015  . PTSD (post-traumatic stress disorder)   . Syncope    Negative outpatient workup.  . Vitamin D deficiency     Patient Active Problem List   Diagnosis Date Noted  . Nephrotic syndrome 12/29/2013  . Purpura ([place]) 12/29/2013  . Hypokalemia 12/29/2013  . Hyponatremia 12/29/2013  . Thrombocytopenia ([place]) 12/29/2013  . Essential hypertension 12/29/2013  . Elevated LFTs 12/29/2013    Past Surgical History:  Procedure Laterality Date   . PROSTATE BIOPSY  June 2015  . PROSTATE SURGERY    . Right hand surgery    . Scrapnel removal from scalp     During [place] war       Home Medications    Prior to Admission medications   Medication Sig Start Date End Date Taking? Authorizing Provider  amLODipine (NORVASC) 10 MG tablet Take 10 mg by mouth daily as needed (for BP >150).    Yes Historical Provider, MD  atorvastatin (LIPITOR) 80 MG tablet Take 40 mg by mouth at bedtime.   Yes Historical Provider, MD  furosemide (LASIX) 40 MG tablet Take 40 mg by mouth daily.   Yes Historical Provider, MD  HYDROcodone-acetaminophen (NORCO) 10-325 MG per tablet Take 1 tablet by mouth every 12 (twelve) hours as needed for moderate pain.    Yes Historical Provider, MD  magnesium oxide (MAGNESIUM-OXIDE) 400 (241.3 Mg) MG tablet Take 400 mg by mouth daily.   Yes Historical Provider, MD  mirtazapine (REMERON) 15 MG tablet Take 15 mg by mouth at bedtime as needed (for sleep/mood).   Yes Historical Provider, MD  omeprazole (PRILOSEC) 20 MG capsule Take 20 mg by mouth daily as needed (for acid reflex).   Yes Historical Provider, MD  Tetrahydroz-Dextran-PEG-Povid (VISINE ADVANCED RELIEF) 0.05-0.1-1-1 % SOLN Place 1 drop into both eyes daily as needed (for irritated eyes).    Yes Historical Provider, MD    Family History No family history on file.  Social  History Social History  Substance Use Topics  . Smoking status: Never Smoker  . Smokeless tobacco: Not on file  . Alcohol use Yes     Comment: occasionaly     Allergies   Allopurinol and Vitamin d analogs   Review of Systems Review of Systems  Genitourinary: Positive for penile pain.  All other systems reviewed and are negative.    Physical Exam Updated Vital Signs BP 137/80 (BP Location: Left Arm)   Pulse (!) 120   Temp 99.2 F (37.3 C) (Oral)   Resp (!) 22   Ht 6\' 6"  (1.981 m)   Wt 203 lb (92.1 kg)   SpO2 96%   BMI 23.46 kg/m   Physical Exam  Constitutional: He is  oriented to person, place, and time. He appears well-developed and well-nourished. He appears distressed.  Patient appears uncomfortable and is wiggling around in the bed  HENT:  Head: Normocephalic and atraumatic.  Mouth/Throat: Oropharynx is clear and moist. Mucous membranes are dry.  Eyes: Conjunctivae and EOM are normal. Pupils are equal, round, and reactive to light.  Neck: Normal range of motion. Neck supple.  Cardiovascular: Regular rhythm and intact distal pulses.  Tachycardia present.   No murmur heard. Pulmonary/Chest: Effort normal and breath sounds normal. No respiratory distress. He has no wheezes. He has no rales.  Abdominal: Soft. He exhibits no distension. There is tenderness in the suprapubic area. There is CVA tenderness. There is no rebound and no guarding.  Genitourinary: Penis normal.  Musculoskeletal: Normal range of motion. He exhibits no edema or tenderness.  Neurological: He is alert and oriented to person, place, and time.  Skin: Skin is warm and dry. No rash noted. No erythema. There is pallor.  Psychiatric: He has a normal mood and affect. His behavior is normal.  Nursing note and vitals reviewed.    ED Treatments / Results  Labs (all labs ordered are listed, but only abnormal results are displayed) Labs Reviewed  BASIC METABOLIC PANEL - Abnormal; Notable for the following:       Result Value   Potassium 3.3 (*)    All other components within normal limits  CBC - Abnormal; Notable for the following:    WBC 13.5 (*)    RBC 4.07 (*)    All other components within normal limits  URINALYSIS, ROUTINE W REFLEX MICROSCOPIC - Abnormal; Notable for the following:    APPearance HAZY (*)    Hgb urine dipstick MODERATE (*)    Ketones, ur 5 (*)    Protein, ur 30 (*)    Leukocytes, UA LARGE (*)    Bacteria, UA RARE (*)    Squamous Epithelial / LPF 0-5 (*)    All other components within normal limits  CBG MONITORING, ED - Abnormal; Notable for the following:     Glucose-Capillary 49 (*)    All other components within normal limits  I-STAT CG4 LACTIC ACID, ED - Abnormal; Notable for the following:    Lactic Acid, Venous 4.47 (*)    All other components within normal limits  CBG MONITORING, ED - Abnormal; Notable for the following:    Glucose-Capillary 124 (*)    All other components within normal limits  I-STAT CG4 LACTIC ACID, ED - Abnormal; Notable for the following:    Lactic Acid, Venous 4.67 (*)    All other components within normal limits  CULTURE, BLOOD (ROUTINE X 2)  CULTURE, BLOOD (ROUTINE X 2)  URINE CULTURE  I-STAT TROPOININ, ED  EKG  EKG Interpretation  Date/Time:  Tuesday June 01 2016 11:02:00 EDT Ventricular Rate:  116 PR Interval:    QRS Duration: 96 QT Interval:  324 QTC Calculation: 451 R Axis:   86 Text Interpretation:  Sinus tachycardia Borderline right axis deviation Low voltage, precordial leads Minimal ST depression, inferior leads Baseline wander in lead(s) III Confirmed by Maryan Rued  MD, Keliah Harned ([id number]) on 06/01/2016 11:20:16 AM       Radiology Dg Chest Port 1 View  Result Date: 06/01/2016 CLINICAL DATA:  Fever, shortness of breath, tachycardia EXAM: PORTABLE CHEST 1 VIEW COMPARISON:  12/24/2015 FINDINGS: Heart and mediastinal contours are within normal limits. No focal opacities or effusions. No acute bony abnormality. IMPRESSION: No active disease. Electronically Signed   By: Rolm Baptise M.D.   On: 06/01/2016 11:35    Procedures Procedures (including critical care time)  Medications Ordered in ED Medications  cefTRIAXone (ROCEPHIN) 2 g in dextrose 5 % 50 mL IVPB (not administered)  sodium chloride 0.9 % bolus 1,000 mL (1,000 mLs Intravenous New Bag/Given 06/01/16 1131)  morphine 4 MG/ML injection 4 mg (4 mg Intravenous Given 06/01/16 1130)  ondansetron (ZOFRAN) injection 4 mg (4 mg Intravenous Given 06/01/16 1130)  acetaminophen (TYLENOL) tablet 650 mg (650 mg Oral Given 06/01/16 1249)  cefTRIAXone  (ROCEPHIN) 2 g in dextrose 5 % 50 mL IVPB (0 g Intravenous Stopped 06/01/16 1248)  sodium chloride 0.9 % bolus 2,000 mL (0 mLs Intravenous Stopped 06/01/16 1248)  dextrose 50 % solution 50 mL (50 mLs Intravenous Given 06/01/16 1254)     Initial Impression / Assessment and Plan / ED Course  I have reviewed the triage vital signs and the nursing notes.  Pertinent labs & imaging results that were available during my care of the patient were reviewed by me and considered in my medical decision making (see chart for details).     Patient is a 71 year old male presenting today with symptoms concerning for sepsis. Patient is febrile, tachycardic, tachypnea and appears uncomfortable. He is having pain in the suprapubic and back area. Concern for possible pyelonephritis. Patient does have a history of recurrent UTIs but last urinary tract infection was 2 months ago. Patient denies any symptoms suggestive of urinary retention and has been able to urinate. He does complain of dysuria, frequency and urgency over last several days. Patient was orthostatic when checked in triage but denies any dizziness while sitting down. He denies any chest pain or shortness of breath or respiratory symptoms. Chest x-ray and EKG without acute findings, CBC with a leukocytosis of 13,000, BMP without acute findings, lactate of 4.47. Patient given 3 L of normal saline per the 30/kg bolus. Patient also given pain control and Tylenol for fever. Rest of labs are still pending.  1:38 PM UA consistent with UTI.  Pt received 3L of fluids.  VS stable.  Tachycardia improved but still 115.  BP wnl.  Pt already received abx and more comfortable after pain control.  Will admit for further care. Patient had one episode of hypoglycemia with a glucose of 49 but that improved with food. He does not have a history of diabetes and takes no hypoglycemics.  2:22 PM Repeat lactate not improved after 3L of fluids.  On re-eval pt was more comfortable.   However will start on rate of NS.  CRITICAL CARE Performed by: Blanchie Dessert Total critical care time: 30 minutes Critical care time was exclusive of separately billable procedures and treating other patients. Critical care was necessary  to treat or prevent imminent or life-threatening deterioration. Critical care was time spent personally by me on the following activities: development of treatment plan with patient and/or surrogate as well as nursing, discussions with consultants, evaluation of patient's response to treatment, examination of patient, obtaining history from patient or surrogate, ordering and performing treatments and interventions, ordering and review of laboratory studies, ordering and review of radiographic studies, pulse oximetry and re-evaluation of patient's condition.    Final Clinical Impressions(s) / ED Diagnoses   Final diagnoses:  Acute cystitis without hematuria  Sepsis, due to unspecified organism [facility])  Hypoglycemia    New Prescriptions New Prescriptions   No medications on file     Blanchie Dessert, MD 06/01/16 1340    Blanchie Dessert, MD 06/01/16 1424

## 2016-06-01 NOTE — ED Notes (Signed)
Call [facility] to give report but they said they are ? Pt admit to the floor. Nurse state she is waiting on [employer] to call her back and they will then call me back.

## 2016-06-01 NOTE — ED Notes (Signed)
Pt had emesis which look reddish and second emesis in the bag was yellow. M.d Made aware.

## 2016-06-01 NOTE — ED Notes (Signed)
Abnormal lab result MD Plunkett have been made aware

## 2016-06-01 NOTE — Progress Notes (Signed)
CRITICAL VALUE ALERT  Critical value received:  Lactic Acid 4.1  Date of notification:  06/01/2016  Time of notification:  2311  Critical value read back:Yes.    Nurse who received alert:  Raynald Blend, RN  MD notified (1st page):  Tylene Fantasia, NP  Time of first page:  2314  MD notified (2nd page):  Time of second page:  Responding MD:  Tylene Fantasia, NP  Time MD responded:  [phone]

## 2016-06-02 DIAGNOSIS — R6520 Severe sepsis without septic shock: Secondary | ICD-10-CM

## 2016-06-02 DIAGNOSIS — N10 Acute tubulo-interstitial nephritis: Secondary | ICD-10-CM

## 2016-06-02 LAB — BASIC METABOLIC PANEL
ANION GAP: 6 (ref 5–15)
BUN: 15 mg/dL (ref 6–20)
CHLORIDE: 104 mmol/L (ref 101–111)
CO2: 25 mmol/L (ref 22–32)
Calcium: 7.7 mg/dL — ABNORMAL LOW (ref 8.9–10.3)
Creatinine, Ser: 0.88 mg/dL (ref 0.61–1.24)
GFR calc Af Amer: >60 mL/min (ref >60)
GFR calc non Af Amer: >60 mL/min (ref >60)
GLUCOSE: 106 mg/dL — AB (ref 65–99)
POTASSIUM: 4.7 mmol/L (ref 3.5–5.1)
Sodium: 135 mmol/L (ref 135–145)

## 2016-06-02 LAB — CBC
HCT: 31.9 % — ABNORMAL LOW (ref 39.0–52.0)
HEMOGLOBIN: 10.9 g/dL — AB (ref 13.0–17.0)
MCH: 33.4 pg (ref 26.0–34.0)
MCHC: 34.2 g/dL (ref 30.0–36.0)
MCV: 97.9 fL (ref 78.0–100.0)
Platelets: 156 10*3/uL (ref 150–400)
RBC: 3.26 MIL/uL — ABNORMAL LOW (ref 4.22–5.81)
RDW: 15.1 % (ref 11.5–15.5)
WBC: 23.6 10*3/uL — ABNORMAL HIGH (ref 4.0–10.5)

## 2016-06-02 LAB — LACTIC ACID, PLASMA
Lactic Acid, Venous: 1.7 mmol/L (ref 0.5–1.9)
Lactic Acid, Venous: 1.7 mmol/L (ref 0.5–1.9)

## 2016-06-02 MED ORDER — BISACODYL 10 MG RE SUPP: 10.0000 mg | Freq: Every day | RECTAL | Status: DC | PRN

## 2016-06-02 MED ORDER — POLYETHYLENE GLYCOL 3350 17 G PO PACK
17.0000 g | PACK | Freq: Every day | ORAL | Status: DC
  Administered 2016-06-02 – 2016-06-04 (×3): 17 g via ORAL
  Filled 2016-06-02 (×3): qty 1

## 2016-06-02 NOTE — Progress Notes (Signed)
PROGRESS NOTE    Steven Bradley   HER:[id number]  DOB: [birth year]  DOA: 06/01/2016 PCP: [facility]   Brief Narrative:  Steven Bradley  is a 71 y.o. male, With past medical history of hypertension, hyperlipidemia, GERD, prostate cancer status post prostatectomy, syncope, presents with complaints of fever, chills, vomiting and dysuria, patient reports he is been feeling ill for the last 3-4 days, reports generalized weakness, loss of appetite, dysuria, polyuria, increased urgency, bilateral lower back pain, penile pain, nausea and vomiting, fever and chills. Workup significant for positive urinalysis, leukocytosis, tachycardia, reports recent UTI couple month ago. He was started on IV Rocephin, IV fluids.    Subjective: Pain, vomiting  Assessment & Plan:   Principal Problem:   Acute pyelonephritis  - WBC 25, HR in 110-120s, lactic acidosis >> severe sepsis  -   lumbar pain, dysuria, vomiting, chills  - still requiring pain meds for back pain - has frequent UTIs- this is about the 6th in 1 yr - last one was about 2 mo ago and treated with a packet of a powder mixed with liquid- likely was Fosfomycin - he sees his Urology for f/u after UTI and will see them again after this one he had symptoms for 3-4 days but never told his wife and over all became worse  - symptoms resolved with Rocephin- f/u cultures   Active Problems:   Hypokalemia - replaced    Nausea and vomiting - resolved - advance to regular diet- stop IVF    Essential hypertension - uses Norvasc PRN SBP > 150- hold for now - hold Lasix      GERD (gastroesophageal reflux disease) - PPI- take omeprazole at home  DVT prophylaxis: Heparin Code Status: full code Family Communication: wie Disposition Plan: home in 1-2 days Consultants:    Procedures:    Antimicrobials:  Anti-infectives    Start     Dose/Rate Route Frequency Ordered Stop   06/02/16 1400  cefTRIAXone (ROCEPHIN) 2 g in dextrose 5 % 50  mL IVPB  Status:  Discontinued     2 g 100 mL/hr over 30 Minutes Intravenous  Once 06/01/16 2227 06/01/16 2234   06/02/16 1200  cefTRIAXone (ROCEPHIN) 2 g in dextrose 5 % 50 mL IVPB     2 g 100 mL/hr over 30 Minutes Intravenous Every 24 hours 06/01/16 1157     06/01/16 1130  cefTRIAXone (ROCEPHIN) 2 g in dextrose 5 % 50 mL IVPB     2 g 100 mL/hr over 30 Minutes Intravenous  Once 06/01/16 1121 06/01/16 1248       Objective: Vitals:   06/01/16 2100 06/01/16 2233 06/02/16 0520 06/02/16 1400  BP: (!) 132/91 139/74 120/76 117/73  Pulse: 94 85 78 68  Resp: 18 18 16 18   Temp:  98.5 F (36.9 C) 99 F (37.2 C) 98.3 F (36.8 C)  TempSrc:  Oral Oral Oral  SpO2: 100% 100% 100% 100%  Weight:      Height:        Intake/Output Summary (Last 24 hours) at 06/02/16 1713 Last data filed at 06/02/16 1405  Gross per 24 hour  Intake          2783.33 ml  Output              525 ml  Net          2258.33 ml   Filed Weights   06/01/16 1022  Weight: 92.1 kg (203 lb)    Examination:  General exam: Appears comfortable  HEENT: PERRLA, oral mucosa moist, no sclera icterus or thrush Respiratory system: Clear to auscultation. Respiratory effort normal. Cardiovascular system: S1 & S2 heard, RRR.  No murmurs  Gastrointestinal system: Abdomen soft, non-tender, nondistended. Normal bowel sound. No organomegaly Central nervous system: Alert and oriented. No focal neurological deficits. Extremities: No cyanosis, clubbing or edema Musculoskeletal: mild tenderness in lower back Skin: No rashes or ulcers Psychiatry:  Mood & affect appropriate.     Data Reviewed: I have personally reviewed following labs and imaging studies  CBC:  Recent Labs Lab 06/01/16 1040 06/01/16 2232 06/02/16 0517  WBC 13.5* 25.9* 23.6*  HGB 13.4 11.4* 10.9*  HCT 39.2 33.5* 31.9*  MCV 96.3 97.4 97.9  PLT 177 138* 086   Basic Metabolic Panel:  Recent Labs Lab 06/01/16 1040 06/01/16 2232 06/02/16 0517  NA 139   --  135  K 3.3*  --  4.7  CL 103  --  104  CO2 23  --  25  GLUCOSE 95  --  106*  BUN 14  --  15  CREATININE 0.89 0.99 0.88  CALCIUM 9.0  --  7.7*   GFR: Estimated Creatinine Clearance: 102.4 mL/min (by C-G formula based on SCr of 0.88 mg/dL). Liver Function Tests: No results for input(s): AST, ALT, ALKPHOS, BILITOT, PROT, ALBUMIN in the last 168 hours. No results for input(s): LIPASE, AMYLASE in the last 168 hours. No results for input(s): AMMONIA in the last 168 hours. Coagulation Profile: No results for input(s): INR, PROTIME in the last 168 hours. Cardiac Enzymes: No results for input(s): CKTOTAL, CKMB, CKMBINDEX, TROPONINI in the last 168 hours. BNP (last 3 results) No results for input(s): PROBNP in the last 8760 hours. HbA1C: No results for input(s): HGBA1C in the last 72 hours. CBG:  Recent Labs Lab 06/01/16 1211 06/01/16 1334  GLUCAP 49* 124*   Lipid Profile: No results for input(s): CHOL, HDL, LDLCALC, TRIG, CHOLHDL, LDLDIRECT in the last 72 hours. Thyroid Function Tests: No results for input(s): TSH, T4TOTAL, FREET4, T3FREE, THYROIDAB in the last 72 hours. Anemia Panel: No results for input(s): VITAMINB12, FOLATE, FERRITIN, TIBC, IRON, RETICCTPCT in the last 72 hours. Urine analysis:    Component Value Date/Time   COLORURINE YELLOW 06/01/2016 1215   APPEARANCEUR HAZY (A) 06/01/2016 1215   LABSPEC 1.010 06/01/2016 1215   PHURINE 6.0 06/01/2016 1215   GLUCOSEU NEGATIVE 06/01/2016 1215   HGBUR MODERATE (A) 06/01/2016 1215   BILIRUBINUR NEGATIVE 06/01/2016 1215   KETONESUR 5 (A) 06/01/2016 1215   PROTEINUR 30 (A) 06/01/2016 1215   UROBILINOGEN 2.0 (H) 03/28/2014 1624   NITRITE NEGATIVE 06/01/2016 1215   LEUKOCYTESUR LARGE (A) 06/01/2016 1215   Sepsis Labs: @LABRCNTIP (procalcitonin:4,lacticidven:4) ) Recent Results (from the past 240 hour(s))  Blood Culture (routine x 2)     Status: None (Preliminary result)   Collection Time: 06/01/16 11:53 AM  Result  Value Ref Range Status   Specimen Description BLOOD RIGHT ANTECUBITAL  Final   Special Requests BOTTLES DRAWN AEROBIC AND ANAEROBIC 5CC  Final   Culture   Final    NO GROWTH < 24 HOURS Performed at [facility], [address], [place], [place] [address]    Report Status PENDING  Incomplete  Blood Culture (routine x 2)     Status: None (Preliminary result)   Collection Time: 06/01/16 12:10 PM  Result Value Ref Range Status   Specimen Description BLOOD LEFT FOREARM  Final   Special Requests BOTTLES DRAWN  AEROBIC AND ANAEROBIC 5CC  Final   Culture   Final    NO GROWTH < 24 HOURS Performed at [facility], [place] [address], [place], [place] [address]    Report Status PENDING  Incomplete  Urine culture     Status: None (Preliminary result)   Collection Time: 06/01/16 12:15 PM  Result Value Ref Range Status   Specimen Description URINE, RANDOM  Final   Special Requests NONE  Final   Culture   Final    CULTURE REINCUBATED FOR BETTER GROWTH Performed at [facility], [place] [address], [place], [place] [address]    Report Status PENDING  Incomplete         Radiology Studies: Dg Chest Port 1 View  Result Date: 06/01/2016 CLINICAL DATA:  Fever, shortness of breath, tachycardia EXAM: PORTABLE CHEST 1 VIEW COMPARISON:  12/24/2015 FINDINGS: Heart and mediastinal contours are within normal limits. No focal opacities or effusions. No acute bony abnormality. IMPRESSION: No active disease. Electronically Signed   By: Rolm Baptise M.D.   On: 06/01/2016 11:35   Ct Renal Stone Study  Result Date: 06/01/2016 CLINICAL DATA:  71 year old hypertensive male with prostate cancer presenting with dysuria, penile pain, back pain and urinary frequency. Initial encounter. EXAM: CT ABDOMEN AND PELVIS WITHOUT CONTRAST TECHNIQUE: Multidetector CT imaging of the abdomen and pelvis was performed following the standard protocol without IV contrast. COMPARISON:  03/28/2014 and 02/05/2014 CT.  FINDINGS: Lower chest: Basilar subsegmental atelectasis with minimal pleural effusions. Prominent coronary artery calcifications. Heart size within normal limits. Hepatobiliary: Fatty liver. Slightly lobular contour. Question cirrhosis. Gallstones. Pancreas: Taking into account limitation by non contrast imaging, no pancreatic mass or pancreatic duct dilation. Spleen: Taking into account limitation by non contrast imaging, no splenic mass or enlargement. Adrenals/Urinary Tract: No adrenal lesion. No renal or ureteral obstructing stone or evidence hydronephrosis. Taking into account limitation by non contrast imaging, no renal mass. Marked circumferential thickening of the bladder. No obvious focal mass although without distention or contrast evaluation limited. Stomach/Bowel: Evaluation of the bowel is limited secondary to under distention and third spacing of fluid. Appearance of circumferential wall thickening of portions of the descending colon, sigmoid colon and rectum may be related to under distension although limited for excluding colitis. Vascular/Lymphatic: Atherosclerotic changes aorta, iliac arteries and femoral arteries without aneurysm. Elongated external iliac lymph nodes without adenopathy. Reproductive: Prior prostate surgery. Other: No free intraperitoneal air. Musculoskeletal: Prominent L4-5 disc space narrowing, endplate reactive changes and sclerosis. This has progressed when compared to 2016 examination and has an appearance most suggestive of degenerative disease rather than infection as there is no surrounding paravertebral abnormality. T9 superior endplate Schmorl's node deformity with slight loss of height stable. Sclerotic appearance of the femoral heads may be related to degenerative changes. No femoral head irregularity to suggest avascular necrosis. Sclerotic metastatic disease felt be less likely consideration. Sacroiliac joint degenerative changes. IMPRESSION: Prominent circumferential  bladder wall thickening. Fatty liver which may be cirrhotic as previously questioned. Third spacing of fluid. Presence of fluid limits evaluation for detection of colitis or cholecystitis. Gallstones. Evaluation of the bowel is limited secondary to under distention and third spacing of fluid. Appearance of circumferential wall thickening of portions of the descending colon, sigmoid colon and rectum may be related to under distension although limited for excluding colitis. No free air. Degenerative changes L4-5 have progressed from the prior exam. Aortic atherosclerosis. Coronary artery calcifications. Sclerotic femoral heads may be related to degenerative changes. Electronically  Signed   By: Genia Del M.D.   On: 06/01/2016 18:54      Scheduled Meds: . atorvastatin  40 mg Oral QHS  . cefTRIAXone (ROCEPHIN)  IV  2 g Intravenous Q24H  . heparin  5,000 Units Subcutaneous Q8H  . magnesium oxide  400 mg Oral Daily  . pantoprazole  40 mg Oral Daily  . polyethylene glycol  17 g Oral Daily   Continuous Infusions: . 0.9 % NaCl with KCl 20 mEq / L 100 mL/hr at 06/01/16 2304     LOS: 1 day    Time spent in minutes: 15    Thatcher, MD [facility] Pager: [email or web address] Password [facility] 06/02/2016, 5:13 PM

## 2016-06-03 DIAGNOSIS — E872 Acidosis: Secondary | ICD-10-CM

## 2016-06-03 LAB — BASIC METABOLIC PANEL
Anion gap: 7 (ref 5–15)
BUN: 12 mg/dL (ref 6–20)
CALCIUM: 8.1 mg/dL — AB (ref 8.9–10.3)
CHLORIDE: 104 mmol/L (ref 101–111)
CO2: 25 mmol/L (ref 22–32)
Creatinine, Ser: 0.86 mg/dL (ref 0.61–1.24)
GFR calc non Af Amer: >60 mL/min (ref >60)
GLUCOSE: 90 mg/dL (ref 65–99)
POTASSIUM: 3.8 mmol/L (ref 3.5–5.1)
Sodium: 136 mmol/L (ref 135–145)

## 2016-06-03 LAB — BLOOD CULTURE ID PANEL (REFLEXED)
ACINETOBACTER BAUMANNII: NOT DETECTED
CANDIDA ALBICANS: NOT DETECTED
CANDIDA PARAPSILOSIS: NOT DETECTED
CANDIDA TROPICALIS: NOT DETECTED
Candida glabrata: NOT DETECTED
Candida krusei: NOT DETECTED
ENTEROBACTERIACEAE SPECIES: NOT DETECTED
ENTEROCOCCUS SPECIES: NOT DETECTED
ESCHERICHIA COLI: NOT DETECTED
Enterobacter cloacae complex: NOT DETECTED
Haemophilus influenzae: NOT DETECTED
KLEBSIELLA OXYTOCA: NOT DETECTED
KLEBSIELLA PNEUMONIAE: NOT DETECTED
LISTERIA MONOCYTOGENES: NOT DETECTED
Neisseria meningitidis: NOT DETECTED
Proteus species: NOT DETECTED
Pseudomonas aeruginosa: NOT DETECTED
SERRATIA MARCESCENS: NOT DETECTED
STREPTOCOCCUS PYOGENES: NOT DETECTED
Staphylococcus aureus (BCID): NOT DETECTED
Staphylococcus species: NOT DETECTED
Streptococcus agalactiae: NOT DETECTED
Streptococcus pneumoniae: NOT DETECTED
Streptococcus species: NOT DETECTED

## 2016-06-03 LAB — URINE CULTURE

## 2016-06-03 LAB — CBC
HCT: 30.9 % — ABNORMAL LOW (ref 39.0–52.0)
HEMOGLOBIN: 10.7 g/dL — AB (ref 13.0–17.0)
MCH: 33.4 pg (ref 26.0–34.0)
MCHC: 34.6 g/dL (ref 30.0–36.0)
MCV: 96.6 fL (ref 78.0–100.0)
Platelets: 124 10*3/uL — ABNORMAL LOW (ref 150–400)
RBC: 3.2 MIL/uL — AB (ref 4.22–5.81)
RDW: 14.6 % (ref 11.5–15.5)
WBC: 16.5 10*3/uL — ABNORMAL HIGH (ref 4.0–10.5)

## 2016-06-03 MED ORDER — SODIUM CHLORIDE 0.9% FLUSH
10.0000 mL | Freq: Two times a day (BID) | INTRAVENOUS | Status: DC
Start: 2016-06-03 — End: 2016-06-04
  Administered 2016-06-03 – 2016-06-04 (×2): 10 mL

## 2016-06-03 MED ORDER — SODIUM CHLORIDE 0.9% FLUSH
10.0000 mL | INTRAVENOUS | Status: DC | PRN
  Administered 2016-06-04: 10 mL
  Filled 2016-06-03: qty 40

## 2016-06-03 NOTE — Progress Notes (Signed)
PHARMACY - PHYSICIAN COMMUNICATION CRITICAL VALUE ALERT - BLOOD CULTURE IDENTIFICATION (BCID)  Results for orders placed or performed during the hospital encounter of 06/01/16  Blood Culture ID Panel (Reflexed) (Collected: 06/01/2016 12:10 PM)  Result Value Ref Range   Enterococcus species NOT DETECTED NOT DETECTED   Listeria monocytogenes NOT DETECTED NOT DETECTED   Staphylococcus species NOT DETECTED NOT DETECTED   Staphylococcus aureus NOT DETECTED NOT DETECTED   Streptococcus species NOT DETECTED NOT DETECTED   Streptococcus agalactiae NOT DETECTED NOT DETECTED   Streptococcus pneumoniae NOT DETECTED NOT DETECTED   Streptococcus pyogenes NOT DETECTED NOT DETECTED   Acinetobacter baumannii NOT DETECTED NOT DETECTED   Enterobacteriaceae species NOT DETECTED NOT DETECTED   Enterobacter cloacae complex NOT DETECTED NOT DETECTED   Escherichia coli NOT DETECTED NOT DETECTED   Klebsiella oxytoca NOT DETECTED NOT DETECTED   Klebsiella pneumoniae NOT DETECTED NOT DETECTED   Proteus species NOT DETECTED NOT DETECTED   Serratia marcescens NOT DETECTED NOT DETECTED   Haemophilus influenzae NOT DETECTED NOT DETECTED   Neisseria meningitidis NOT DETECTED NOT DETECTED   Pseudomonas aeruginosa NOT DETECTED NOT DETECTED   Candida albicans NOT DETECTED NOT DETECTED   Candida glabrata NOT DETECTED NOT DETECTED   Candida krusei NOT DETECTED NOT DETECTED   Candida parapsilosis NOT DETECTED NOT DETECTED   Candida tropicalis NOT DETECTED NOT DETECTED    Name of physician (or Provider) Contacted: Rizwan  Changes to prescribed antibiotics required: none, continue Rocephin  Kara Mead 06/03/2016  1:30 PM

## 2016-06-03 NOTE — Progress Notes (Signed)
PROGRESS NOTE    Steven Bradley   FUX:[id number]  DOB: 06/03/1945  DOA: 06/01/2016 PCP: [facility]   Brief Narrative:  Steven Bradley  is a 71 y.o. male, With past medical history of hypertension, hyperlipidemia, GERD, prostate cancer status post prostatectomy, syncope, presents with complaints of fever, chills, vomiting and dysuria, patient reports he is been feeling ill for the last 3-4 days, reports generalized weakness, loss of appetite, dysuria, polyuria, increased urgency, bilateral lower back pain, penile pain, nausea and vomiting, fever and chills. Workup significant for positive urinalysis, leukocytosis, tachycardia, reports recent UTI couple month ago. He was started on IV Rocephin, IV fluids.    Subjective: Back pain is improving but not resolved.   Assessment & Plan:   Principal Problem:   Acute pyelonephritis  - WBC 25, HR in 110-120s, lactic acidosis >> severe sepsis  -   lumbar pain, dysuria, vomiting, chills  - still requiring pain meds for back pain - has frequent UTIs- this is about the 6th in 1 yr - last one was about 2 mo ago and treated with a packet of a powder mixed with liquid- likely was Fosfomycin - he sees his Urology for f/u after UTI and will see them again after this one he had symptoms for 3-4 days but never told his wife and over all became worse  - symptoms resolved with Rocephin - Pro calcitonin 30.92 yesterday-  - U cultures growing Aerococcus urinae and 1/2 blood culture growing gr + cocci in cluister- d/w ID, Dr Graylon Good feels the blood culture is likely Aerococcus and recommend Rocephin x total of 14 days - have discussed this with RN, patient and his wife in detail. PICC ordered, spoke with case management to help with [facility] arrangements, RN to do teaching. Will go home after tomorrow's dose.    Active Problems:   Hypokalemia - replaced  Mild thrombocytopenia - acute due to Sepsis    Nausea and vomiting - resolved - advance to  regular diet- stop IVF    Essential hypertension - uses Norvasc PRN SBP > 150- hold for now as BP has been normal or slightly low - hold Lasix      GERD (gastroesophageal reflux disease) - PPI- takes omeprazole at home  DVT prophylaxis: Heparin Code Status: full code Family Communication: wife Disposition Plan: home tomorrow Consultants:    Procedures:    Antimicrobials:  Anti-infectives    Start     Dose/Rate Route Frequency Ordered Stop   06/02/16 1400  cefTRIAXone (ROCEPHIN) 2 g in dextrose 5 % 50 mL IVPB  Status:  Discontinued     2 g 100 mL/hr over 30 Minutes Intravenous  Once 06/01/16 2227 06/01/16 2234   06/02/16 1200  cefTRIAXone (ROCEPHIN) 2 g in dextrose 5 % 50 mL IVPB     2 g 100 mL/hr over 30 Minutes Intravenous Every 24 hours 06/01/16 1157     06/01/16 1130  cefTRIAXone (ROCEPHIN) 2 g in dextrose 5 % 50 mL IVPB     2 g 100 mL/hr over 30 Minutes Intravenous  Once 06/01/16 1121 06/01/16 1248       Objective: Vitals:   06/02/16 0520 06/02/16 1400 06/02/16 2103 06/03/16 0613  BP: 120/76 117/73 128/80 116/74  Pulse: 78 68 79 69  Resp: 16 18 16 14   Temp: 99 F (37.2 C) 98.3 F (36.8 C) 98.6 F (37 C) 98.2 F (36.8 C)  TempSrc: Oral Oral Oral Oral  SpO2: 100% 100% 98% 98%  Weight:      Height:        Intake/Output Summary (Last 24 hours) at 06/03/16 1417 Last data filed at 06/03/16 1000  Gross per 24 hour  Intake              480 ml  Output              700 ml  Net             -220 ml   Filed Weights   06/01/16 1022  Weight: 92.1 kg (203 lb)    Examination: General exam: Appears comfortable  HEENT: PERRLA, oral mucosa moist, no sclera icterus or thrush Respiratory system: Clear to auscultation. Respiratory effort normal. Cardiovascular system: S1 & S2 heard, RRR.  No murmurs  Gastrointestinal system: Abdomen soft, non-tender, nondistended. Normal bowel sound. No organomegaly Central nervous system: Alert and oriented. No focal neurological  deficits. Extremities: No cyanosis, clubbing or edema Musculoskeletal: mild tenderness in lower back Skin: No rashes or ulcers Psychiatry:  Mood & affect appropriate.     Data Reviewed: I have personally reviewed following labs and imaging studies  CBC:  Recent Labs Lab 06/01/16 1040 06/01/16 2232 06/02/16 0517 06/03/16 0513  WBC 13.5* 25.9* 23.6* 16.5*  HGB 13.4 11.4* 10.9* 10.7*  HCT 39.2 33.5* 31.9* 30.9*  MCV 96.3 97.4 97.9 96.6  PLT 177 138* 156 497*   Basic Metabolic Panel:  Recent Labs Lab 06/01/16 1040 06/01/16 2232 06/02/16 0517 06/03/16 0513  NA 139  --  135 136  K 3.3*  --  4.7 3.8  CL 103  --  104 104  CO2 23  --  25 25  GLUCOSE 95  --  106* 90  BUN 14  --  15 12  CREATININE 0.89 0.99 0.88 0.86  CALCIUM 9.0  --  7.7* 8.1*   GFR: Estimated Creatinine Clearance: 104.8 mL/min (by C-G formula based on SCr of 0.86 mg/dL). Liver Function Tests: No results for input(s): AST, ALT, ALKPHOS, BILITOT, PROT, ALBUMIN in the last 168 hours. No results for input(s): LIPASE, AMYLASE in the last 168 hours. No results for input(s): AMMONIA in the last 168 hours. Coagulation Profile: No results for input(s): INR, PROTIME in the last 168 hours. Cardiac Enzymes: No results for input(s): CKTOTAL, CKMB, CKMBINDEX, TROPONINI in the last 168 hours. BNP (last 3 results) No results for input(s): PROBNP in the last 8760 hours. HbA1C: No results for input(s): HGBA1C in the last 72 hours. CBG:  Recent Labs Lab 06/01/16 1211 06/01/16 1334  GLUCAP 49* 124*   Lipid Profile: No results for input(s): CHOL, HDL, LDLCALC, TRIG, CHOLHDL, LDLDIRECT in the last 72 hours. Thyroid Function Tests: No results for input(s): TSH, T4TOTAL, FREET4, T3FREE, THYROIDAB in the last 72 hours. Anemia Panel: No results for input(s): VITAMINB12, FOLATE, FERRITIN, TIBC, IRON, RETICCTPCT in the last 72 hours. Urine analysis:    Component Value Date/Time   COLORURINE YELLOW 06/01/2016 1215     APPEARANCEUR HAZY (A) 06/01/2016 1215   LABSPEC 1.010 06/01/2016 1215   PHURINE 6.0 06/01/2016 1215   GLUCOSEU NEGATIVE 06/01/2016 1215   HGBUR MODERATE (A) 06/01/2016 1215   BILIRUBINUR NEGATIVE 06/01/2016 1215   KETONESUR 5 (A) 06/01/2016 1215   PROTEINUR 30 (A) 06/01/2016 1215   UROBILINOGEN 2.0 (H) 03/28/2014 1624   NITRITE NEGATIVE 06/01/2016 1215   LEUKOCYTESUR LARGE (A) 06/01/2016 1215   Sepsis Labs: @LABRCNTIP (procalcitonin:4,lacticidven:4) ) Recent Results (from the past 240 hour(s))  Blood Culture (routine x 2)  Status: None (Preliminary result)   Collection Time: 06/01/16 11:53 AM  Result Value Ref Range Status   Specimen Description BLOOD RIGHT ANTECUBITAL  Final   Special Requests BOTTLES DRAWN AEROBIC AND ANAEROBIC 5CC  Final   Culture   Final    NO GROWTH 2 DAYS Performed at [facility], [address], [place], [place] [address]    Report Status PENDING  Incomplete  Blood Culture (routine x 2)     Status: None (Preliminary result)   Collection Time: 06/01/16 12:10 PM  Result Value Ref Range Status   Specimen Description BLOOD LEFT FOREARM  Final   Special Requests BOTTLES DRAWN AEROBIC AND ANAEROBIC 5CC  Final   Culture  Setup Time   Final    GRAM POSITIVE COCCI IN CLUSTERS IN BOTH AEROBIC AND ANAEROBIC BOTTLES CRITICAL RESULT CALLED TO, READ BACK BY AND VERIFIED WITH: GADHIA, J, WL PHARMD AT 5366 ON 06/03/16 BY JESSUP, C, MLT. Performed at [facility], [place] [address] [place], [place] [address]    Culture GRAM POSITIVE COCCI  Final   Report Status PENDING  Incomplete  Blood Culture ID Panel (Reflexed)     Status: None   Collection Time: 06/01/16 12:10 PM  Result Value Ref Range Status   Enterococcus species NOT DETECTED NOT DETECTED Final   Listeria monocytogenes NOT DETECTED NOT DETECTED Final   Staphylococcus species NOT DETECTED NOT DETECTED Final   Staphylococcus aureus NOT DETECTED NOT DETECTED Final   Streptococcus species NOT  DETECTED NOT DETECTED Final   Streptococcus agalactiae NOT DETECTED NOT DETECTED Final   Streptococcus pneumoniae NOT DETECTED NOT DETECTED Final   Streptococcus pyogenes NOT DETECTED NOT DETECTED Final   Acinetobacter baumannii NOT DETECTED NOT DETECTED Final   Enterobacteriaceae species NOT DETECTED NOT DETECTED Final   Enterobacter cloacae complex NOT DETECTED NOT DETECTED Final   Escherichia coli NOT DETECTED NOT DETECTED Final   Klebsiella oxytoca NOT DETECTED NOT DETECTED Final   Klebsiella pneumoniae NOT DETECTED NOT DETECTED Final   Proteus species NOT DETECTED NOT DETECTED Final   Serratia marcescens NOT DETECTED NOT DETECTED Final   Haemophilus influenzae NOT DETECTED NOT DETECTED Final   Neisseria meningitidis NOT DETECTED NOT DETECTED Final   Pseudomonas aeruginosa NOT DETECTED NOT DETECTED Final   Candida albicans NOT DETECTED NOT DETECTED Final   Candida glabrata NOT DETECTED NOT DETECTED Final   Candida krusei NOT DETECTED NOT DETECTED Final   Candida parapsilosis NOT DETECTED NOT DETECTED Final   Candida tropicalis NOT DETECTED NOT DETECTED Final    Comment: Performed at [facility], [place]. [address] [place], [place] [address]  Urine culture     Status: Abnormal   Collection Time: 06/01/16 12:15 PM  Result Value Ref Range Status   Specimen Description URINE, RANDOM  Final   Special Requests NONE  Final   Culture >=100,000 COLONIES/mL AEROCOCCUS URINAE (A)  Final   Report Status 06/03/2016 FINAL  Final         Radiology Studies: Ct Renal Stone Study  Result Date: 06/01/2016 CLINICAL DATA:  71 year old hypertensive male with prostate cancer presenting with dysuria, penile pain, back pain and urinary frequency. Initial encounter. EXAM: CT ABDOMEN AND PELVIS WITHOUT CONTRAST TECHNIQUE: Multidetector CT imaging of the abdomen and pelvis was performed following the standard protocol without IV contrast. COMPARISON:  03/28/2014 and 02/05/2014 CT. FINDINGS: Lower  chest: Basilar subsegmental atelectasis with minimal pleural effusions. Prominent coronary artery calcifications. Heart size within normal limits. Hepatobiliary:  Fatty liver. Slightly lobular contour. Question cirrhosis. Gallstones. Pancreas: Taking into account limitation by non contrast imaging, no pancreatic mass or pancreatic duct dilation. Spleen: Taking into account limitation by non contrast imaging, no splenic mass or enlargement. Adrenals/Urinary Tract: No adrenal lesion. No renal or ureteral obstructing stone or evidence hydronephrosis. Taking into account limitation by non contrast imaging, no renal mass. Marked circumferential thickening of the bladder. No obvious focal mass although without distention or contrast evaluation limited. Stomach/Bowel: Evaluation of the bowel is limited secondary to under distention and third spacing of fluid. Appearance of circumferential wall thickening of portions of the descending colon, sigmoid colon and rectum may be related to under distension although limited for excluding colitis. Vascular/Lymphatic: Atherosclerotic changes aorta, iliac arteries and femoral arteries without aneurysm. Elongated external iliac lymph nodes without adenopathy. Reproductive: Prior prostate surgery. Other: No free intraperitoneal air. Musculoskeletal: Prominent L4-5 disc space narrowing, endplate reactive changes and sclerosis. This has progressed when compared to 2016 examination and has an appearance most suggestive of degenerative disease rather than infection as there is no surrounding paravertebral abnormality. T9 superior endplate Schmorl's node deformity with slight loss of height stable. Sclerotic appearance of the femoral heads may be related to degenerative changes. No femoral head irregularity to suggest avascular necrosis. Sclerotic metastatic disease felt be less likely consideration. Sacroiliac joint degenerative changes. IMPRESSION: Prominent circumferential bladder wall  thickening. Fatty liver which may be cirrhotic as previously questioned. Third spacing of fluid. Presence of fluid limits evaluation for detection of colitis or cholecystitis. Gallstones. Evaluation of the bowel is limited secondary to under distention and third spacing of fluid. Appearance of circumferential wall thickening of portions of the descending colon, sigmoid colon and rectum may be related to under distension although limited for excluding colitis. No free air. Degenerative changes L4-5 have progressed from the prior exam. Aortic atherosclerosis. Coronary artery calcifications. Sclerotic femoral heads may be related to degenerative changes. Electronically Signed   By: Genia Del M.D.   On: 06/01/2016 18:54      Scheduled Meds: . atorvastatin  40 mg Oral QHS  . cefTRIAXone (ROCEPHIN)  IV  2 g Intravenous Q24H  . heparin  5,000 Units Subcutaneous Q8H  . magnesium oxide  400 mg Oral Daily  . pantoprazole  40 mg Oral Daily  . polyethylene glycol  17 g Oral Daily   Continuous Infusions:    LOS: 2 days    Time spent in minutes: [place], MD [facility] Pager: [email or web address] Password [facility] 06/03/2016, 2:17 PM

## 2016-06-03 NOTE — Progress Notes (Signed)
Spoke with patient at bedside. Discussed need for PICC for IV abx at home. Patient understands, offered [facility] choice. Patient would like [facility] for [facility] services, contacted [facility] to arrange. Awaiting PICC placement, plan for d/c in am.

## 2016-06-03 NOTE — Progress Notes (Signed)
Peripherally Inserted Central Catheter/Midline Placement  The IV Nurse has discussed with the patient and/or persons authorized to consent for the patient, the purpose of this procedure and the potential benefits and risks involved with this procedure.  The benefits include less needle sticks, lab draws from the catheter, and the patient may be discharged home with the catheter. Risks include, but not limited to, infection, bleeding, blood clot (thrombus formation), and puncture of an artery; nerve damage and irregular heartbeat and possibility to perform a PICC exchange if needed/ordered by physician.  Alternatives to this procedure were also discussed.  Bard Power PICC patient education guide, fact sheet on infection prevention and patient information card has been provided to patient /or left at bedside.    PICC/Midline Placement Documentation  PICC Single Lumen 61/60/73 PICC Right Basilic 42 cm 0 cm (Active)  Indication for Insertion or Continuance of Line Home intravenous therapies (PICC only) 06/03/2016  9:49 PM  Exposed Catheter (cm) 0 cm 06/03/2016  9:49 PM  Site Assessment Clean;Dry;Intact 06/03/2016  9:49 PM  Line Status Flushed;Saline locked;Blood return noted 06/03/2016  9:49 PM  Dressing Type Transparent 06/03/2016  9:49 PM  Dressing Status Clean;Dry;Intact 06/03/2016  9:49 PM  Dressing Change Due 06/10/16 06/03/2016  9:49 PM       Gordan Payment 06/03/2016, 9:50 PM

## 2016-06-04 DIAGNOSIS — A419 Sepsis, unspecified organism: Secondary | ICD-10-CM

## 2016-06-04 DIAGNOSIS — R7881 Bacteremia: Secondary | ICD-10-CM

## 2016-06-04 DIAGNOSIS — R6520 Severe sepsis without septic shock: Secondary | ICD-10-CM

## 2016-06-04 LAB — CULTURE, BLOOD (ROUTINE X 2)

## 2016-06-04 MED ORDER — ACETAMINOPHEN 325 MG PO TABS: 650.0000 mg | ORAL_TABLET | Freq: Four times a day (QID) | ORAL | Status: DC | PRN

## 2016-06-04 MED ORDER — DEXTROSE 5 % IV SOLN: 2.0000 g | INTRAVENOUS | 0 refills | Status: DC

## 2016-06-04 NOTE — Discharge Instructions (Signed)
Due to the severity of infection, please see your Urologist next week. If you cannot get in to be seen, see your PCP instead.   You will need the following blood work in 1 wk- CBC  Please take all your medications with you for your next visit with your Primary MD. Please request your Primary MD to go over all hospital test results at the follow up. Please ask your Primary MD to get all Hospital records sent to his/her office.  If you experience worsening of your admission symptoms, develop shortness of breath, chest pain, suicidal or homicidal thoughts or a life threatening emergency, you must seek medical attention immediately by calling 911 or calling your MD.  Dennis Bast must read the complete instructions/literature along with all the possible adverse reactions/side effects for all the medicines you take including new medications that have been prescribed to you. Take new medicines after you have completely understood and accpet all the possible adverse reactions/side effects.   Do not drive when taking pain medications or sedatives.    Do not take more than prescribed Pain, Sleep and Anxiety Medications  If you have smoked or chewed Tobacco in the last 2 yrs please stop. Stop any regular alcohol and or recreational drug use.  Wear Seat belts while driving.

## 2016-06-04 NOTE — Progress Notes (Signed)
[facility] will be providing HHRN and Home Infusion Pharmacy services for Steven Bradley upon DC home.  [facility] is planned for an admission visit and additional IV ABX teaching on 06-05-16 between 11A-1P   Teaching with Steven Bradley today regarding IV Rocephin administration and she did very well.  She verbalized she is confident she can be independent with IV Rocephin at home.  If patient discharges after hours, please call [phone].   Larry Sierras 06/04/2016, 2:02 PM

## 2016-06-04 NOTE — Progress Notes (Signed)
PHARMACY CONSULT NOTE FOR:  OUTPATIENT  PARENTERAL ANTIBIOTIC THERAPY (OPAT)  Indication: areococcus bacteremia/UTI Regimen: Rocephin 2g IV q24 End date: 06/14/16  IV antibiotic discharge orders are pended. To discharging provider:  please sign these orders via discharge navigator,  Select New Orders & click on the button choice - Manage This Unsigned Work.     Thank you for allowing pharmacy to be a part of this patient's care.  Kara Mead 06/04/2016, 12:12 PM

## 2016-06-04 NOTE — Discharge Summary (Signed)
Physician Discharge Summary  Steven Bradley XNA:[id number] DOB: [birth year] DOA: 06/01/2016  PCP: [place] date: 06/01/2016 Discharge date: 06/04/2016  Admitted From: home  Disposition:  home   Recommendations for Outpatient Follow-up:  1. Recommend discussions on prevention of recurrent UTIs 2. f/u WBC count and Platelets with treatment of current infection  Home Health:  RN  Equipment/Devices:  none    Discharge Condition:  stable   CODE STATUS:  Full code   Diet recommendation:  Heart healthy Consultations:  Phone consult with ID, Dr Graylon Good    Discharge Diagnoses:  Principal Problem:   Acute pyelonephritis Active Problems:   Nausea and vomiting   Hypokalemia   Severe sepsis ([place])   Bacteremia due to Gram-positive bacteria   Essential hypertension   Hyperlipemia   GERD (gastroesophageal reflux disease)    Subjective: No complaints.  Brief Summary: JamesHowardis a 71 y.o.male,With past medical history of hypertension, hyperlipidemia, GERD, prostate cancer status post prostatectomy, syncope, presents with complaints of fever, chills, vomiting and dysuria,patient reports he is been feeling ill for the last 3-4 days, reports generalized weakness, loss of appetite, dysuria, polyuria, increased urgency, bilateral lower back pain, penile pain, nausea and vomiting, fever and chills. Has frequent UTIs- this is about the 6th in 1 yr - last one was about 2 mo ago and treated with a packet of a powder mixed with liquid (likely was Fosfomycin). He sees his Urology for f/u after every UTI and will see them again after this one  Workup in ER significant for positive urinalysis, leukocytosis, tachycardia. He was started on IV Rocephin, IV fluids  Hospital Course:   Acute pyelonephritis, Bactremia - WBC 25, HR in 110-120s, lactic acidosis >> severe sepsis  -   lumbar pain, dysuria, vomiting, chills  - Pro calcitonin 30.92  - symptoms resolved with  Rocephin - U cultures growing Aerococcus urinae and 1/2 blood culture growing the same- d/w ID, Dr Graylon Good who has recommend Rocephin x total of 14 days - have discussed plan with RN, patient and his wife in detail.  - PICC placed today, spoke with case management to help with [facility] arrangements, RN has done teaching as well while giving Rocephin in the hospital.   Active Problems:   Hypokalemia - replaced  Mild thrombocytopenia - acute due to Sepsis?- follow as oupt    Nausea and vomiting - resolved - advance to regular diet- stop IVF    Essential hypertension - uses Norvasc PRN SBP > 150  - held Lasix while in hospital     GERD (gastroesophageal reflux disease) - PPI- takes omeprazole at home- asked to switch to Pepcid while on Rocephin to decrease chance of C diff.   Discharge Instructions  Discharge Instructions    Diet - low sodium heart healthy    Complete by:  As directed    Discharge instructions    Complete by:  As directed    Change Protonix to Pepcid while you are on antibiotics. Taking Protonix and antibiotics together puts you at risk for another type of colon infection called C diff.   Increase activity slowly    Complete by:  As directed      Allergies as of 06/04/2016      Reactions   Allopurinol Rash   Vitamin D Analogs Rash      Medication List    STOP taking these medications   omeprazole 20 MG capsule Commonly known as:  PRILOSEC     TAKE  these medications   acetaminophen 325 MG tablet Commonly known as:  TYLENOL Take 2 tablets (650 mg total) by mouth every 6 (six) hours as needed for mild pain (or Fever >/= 101).   amLODipine 10 MG tablet Commonly known as:  NORVASC Take 10 mg by mouth daily as needed (for BP >150).   atorvastatin 80 MG tablet Commonly known as:  LIPITOR Take 40 mg by mouth at bedtime.   cefTRIAXone 2 g in dextrose 5 % 50 mL Inject 2 g into the vein daily. Start taking on:  06/05/2016   furosemide 40 MG  tablet Commonly known as:  LASIX Take 40 mg by mouth daily.   HYDROcodone-acetaminophen 10-325 MG tablet Commonly known as:  NORCO Take 1 tablet by mouth every 12 (twelve) hours as needed for moderate pain.   MAGNESIUM-OXIDE 400 (241.3 Mg) MG tablet Generic drug:  magnesium oxide Take 400 mg by mouth daily.   mirtazapine 15 MG tablet Commonly known as:  REMERON Take 15 mg by mouth at bedtime as needed (for sleep/mood).   VISINE ADVANCED RELIEF 0.05-0.1-1-1 % Soln Generic drug:  Tetrahydroz-Dextran-PEG-Povid Place 1 drop into both eyes daily as needed (for irritated eyes).      Follow-up Information    [facility] Follow up.   Why:  nurse to assist with IV antibiotics Contact information: [place] [address] [phone]        [facility]. Schedule an appointment as soon as possible for a visit in 1 week(s).   Why:  see PCP and have CBC done in 1 wk if you cannot be seen by your Urologist Contact information: [place] [address] [phone]          Allergies  Allergen Reactions  . Allopurinol Rash  . Vitamin D Analogs Rash     Procedures/Studies:    Dg Chest Port 1 View  Result Date: 06/01/2016 CLINICAL DATA:  Fever, shortness of breath, tachycardia EXAM: PORTABLE CHEST 1 VIEW COMPARISON:  12/24/2015 FINDINGS: Heart and mediastinal contours are within normal limits. No focal opacities or effusions. No acute bony abnormality. IMPRESSION: No active disease. Electronically Signed   By: Rolm Baptise M.D.   On: 06/01/2016 11:35   Ct Renal Stone Study  Result Date: 06/01/2016 CLINICAL DATA:  71 year old hypertensive male with prostate cancer presenting with dysuria, penile pain, back pain and urinary frequency. Initial encounter. EXAM: CT ABDOMEN AND PELVIS WITHOUT CONTRAST TECHNIQUE: Multidetector CT imaging of the abdomen and pelvis was performed following the standard  protocol without IV contrast. COMPARISON:  03/28/2014 and 02/05/2014 CT. FINDINGS: Lower chest: Basilar subsegmental atelectasis with minimal pleural effusions. Prominent coronary artery calcifications. Heart size within normal limits. Hepatobiliary: Fatty liver. Slightly lobular contour. Question cirrhosis. Gallstones. Pancreas: Taking into account limitation by non contrast imaging, no pancreatic mass or pancreatic duct dilation. Spleen: Taking into account limitation by non contrast imaging, no splenic mass or enlargement. Adrenals/Urinary Tract: No adrenal lesion. No renal or ureteral obstructing stone or evidence hydronephrosis. Taking into account limitation by non contrast imaging, no renal mass. Marked circumferential thickening of the bladder. No obvious focal mass although without distention or contrast evaluation limited. Stomach/Bowel: Evaluation of the bowel is limited secondary to under distention and third spacing of fluid. Appearance of circumferential wall thickening of portions of the descending colon, sigmoid colon and rectum may be related to under distension although limited for excluding colitis. Vascular/Lymphatic: Atherosclerotic changes aorta, iliac arteries and femoral  arteries without aneurysm. Elongated external iliac lymph nodes without adenopathy. Reproductive: Prior prostate surgery. Other: No free intraperitoneal air. Musculoskeletal: Prominent L4-5 disc space narrowing, endplate reactive changes and sclerosis. This has progressed when compared to 2016 examination and has an appearance most suggestive of degenerative disease rather than infection as there is no surrounding paravertebral abnormality. T9 superior endplate Schmorl's node deformity with slight loss of height stable. Sclerotic appearance of the femoral heads may be related to degenerative changes. No femoral head irregularity to suggest avascular necrosis. Sclerotic metastatic disease felt be less likely consideration.  Sacroiliac joint degenerative changes. IMPRESSION: Prominent circumferential bladder wall thickening. Fatty liver which may be cirrhotic as previously questioned. Third spacing of fluid. Presence of fluid limits evaluation for detection of colitis or cholecystitis. Gallstones. Evaluation of the bowel is limited secondary to under distention and third spacing of fluid. Appearance of circumferential wall thickening of portions of the descending colon, sigmoid colon and rectum may be related to under distension although limited for excluding colitis. No free air. Degenerative changes L4-5 have progressed from the prior exam. Aortic atherosclerosis. Coronary artery calcifications. Sclerotic femoral heads may be related to degenerative changes. Electronically Signed   By: Genia Del M.D.   On: 06/01/2016 18:54       Discharge Exam: Vitals:   06/03/16 2146 06/04/16 0605  BP: (!) 155/97 135/84  Pulse: 72 61  Resp: 16 18  Temp: 98.5 F (36.9 C) 98.1 F (36.7 C)   Vitals:   06/03/16 0613 06/03/16 1400 06/03/16 2146 06/04/16 0605  BP: 116/74 132/78 (!) 155/97 135/84  Pulse: 69 77 72 61  Resp: 14 18 16 18   Temp: 98.2 F (36.8 C) 98.4 F (36.9 C) 98.5 F (36.9 C) 98.1 F (36.7 C)  TempSrc: Oral Oral Oral Oral  SpO2: 98% 100% 100% 97%  Weight:      Height:        General: Pt is alert, awake, not in acute distress Cardiovascular: RRR, S1/S2 +, no rubs, no gallops Respiratory: CTA bilaterally, no wheezing, no rhonchi Abdominal: Soft, NT, ND, bowel sounds + Extremities: no edema, no cyanosis    The results of significant diagnostics from this hospitalization (including imaging, microbiology, ancillary and laboratory) are listed below for reference.     Microbiology: Recent Results (from the past 240 hour(s))  Blood Culture (routine x 2)     Status: None (Preliminary result)   Collection Time: 06/01/16 11:53 AM  Result Value Ref Range Status   Specimen Description BLOOD RIGHT  ANTECUBITAL  Final   Special Requests BOTTLES DRAWN AEROBIC AND ANAEROBIC 5CC  Final   Culture   Final    NO GROWTH 2 DAYS Performed at [facility], [address], [place], [place] [address]    Report Status PENDING  Incomplete  Blood Culture (routine x 2)     Status: Abnormal   Collection Time: 06/01/16 12:10 PM  Result Value Ref Range Status   Specimen Description BLOOD LEFT FOREARM  Final   Special Requests BOTTLES DRAWN AEROBIC AND ANAEROBIC 5CC  Final   Culture  Setup Time   Final    GRAM POSITIVE COCCI IN CLUSTERS IN BOTH AEROBIC AND ANAEROBIC BOTTLES CRITICAL RESULT CALLED TO, READ BACK BY AND VERIFIED WITH: GADHIA, J, WL PHARMD AT [phone] ON 06/03/16 BY JESSUP, C, MLT.    Culture (A)  Final    AEROCOCCUS SPECIES Standardized susceptibility testing for this organism is not available. Performed at [facility],  [address], [place], [place] [address]    Report Status 06/04/2016 FINAL  Final  Blood Culture ID Panel (Reflexed)     Status: None   Collection Time: 06/01/16 12:10 PM  Result Value Ref Range Status   Enterococcus species NOT DETECTED NOT DETECTED Final   Listeria monocytogenes NOT DETECTED NOT DETECTED Final   Staphylococcus species NOT DETECTED NOT DETECTED Final   Staphylococcus aureus NOT DETECTED NOT DETECTED Final   Streptococcus species NOT DETECTED NOT DETECTED Final   Streptococcus agalactiae NOT DETECTED NOT DETECTED Final   Streptococcus pneumoniae NOT DETECTED NOT DETECTED Final   Streptococcus pyogenes NOT DETECTED NOT DETECTED Final   Acinetobacter baumannii NOT DETECTED NOT DETECTED Final   Enterobacteriaceae species NOT DETECTED NOT DETECTED Final   Enterobacter cloacae complex NOT DETECTED NOT DETECTED Final   Escherichia coli NOT DETECTED NOT DETECTED Final   Klebsiella oxytoca NOT DETECTED NOT DETECTED Final   Klebsiella pneumoniae NOT DETECTED NOT DETECTED Final   Proteus species NOT DETECTED NOT DETECTED Final   Serratia  marcescens NOT DETECTED NOT DETECTED Final   Haemophilus influenzae NOT DETECTED NOT DETECTED Final   Neisseria meningitidis NOT DETECTED NOT DETECTED Final   Pseudomonas aeruginosa NOT DETECTED NOT DETECTED Final   Candida albicans NOT DETECTED NOT DETECTED Final   Candida glabrata NOT DETECTED NOT DETECTED Final   Candida krusei NOT DETECTED NOT DETECTED Final   Candida parapsilosis NOT DETECTED NOT DETECTED Final   Candida tropicalis NOT DETECTED NOT DETECTED Final    Comment: Performed at [facility], [place] [address], [place], [place] [address]  Urine culture     Status: Abnormal   Collection Time: 06/01/16 12:15 PM  Result Value Ref Range Status   Specimen Description URINE, RANDOM  Final   Special Requests NONE  Final   Culture >=100,000 COLONIES/mL AEROCOCCUS URINAE (A)  Final   Report Status 06/03/2016 FINAL  Final     Labs: BNP (last 3 results) No results for input(s): BNP in the last 8760 hours. Basic Metabolic Panel:  Recent Labs Lab 06/01/16 1040 06/01/16 2232 06/02/16 0517 06/03/16 0513  NA 139  --  135 136  K 3.3*  --  4.7 3.8  CL 103  --  104 104  CO2 23  --  25 25  GLUCOSE 95  --  106* 90  BUN 14  --  15 12  CREATININE 0.89 0.99 0.88 0.86  CALCIUM 9.0  --  7.7* 8.1*   Liver Function Tests: No results for input(s): AST, ALT, ALKPHOS, BILITOT, PROT, ALBUMIN in the last 168 hours. No results for input(s): LIPASE, AMYLASE in the last 168 hours. No results for input(s): AMMONIA in the last 168 hours. CBC:  Recent Labs Lab 06/01/16 1040 06/01/16 2232 06/02/16 0517 06/03/16 0513  WBC 13.5* 25.9* 23.6* 16.5*  HGB 13.4 11.4* 10.9* 10.7*  HCT 39.2 33.5* 31.9* 30.9*  MCV 96.3 97.4 97.9 96.6  PLT 177 138* 156 124*   Cardiac Enzymes: No results for input(s): CKTOTAL, CKMB, CKMBINDEX, TROPONINI in the last 168 hours. BNP: Invalid input(s): POCBNP CBG:  Recent Labs Lab 06/01/16 1211 06/01/16 1334  GLUCAP 49* 124*   D-Dimer No results for  input(s): DDIMER in the last 72 hours. Hgb A1c No results for input(s): HGBA1C in the last 72 hours. Lipid Profile No results for input(s): CHOL, HDL, LDLCALC, TRIG, CHOLHDL, LDLDIRECT in the last 72 hours. Thyroid function studies No results for input(s): TSH, T4TOTAL, T3FREE, THYROIDAB in  the last 72 hours.  Invalid input(s): FREET3 Anemia work up No results for input(s): VITAMINB12, FOLATE, FERRITIN, TIBC, IRON, RETICCTPCT in the last 72 hours. Urinalysis    Component Value Date/Time   COLORURINE YELLOW 06/01/2016 1215   APPEARANCEUR HAZY (A) 06/01/2016 1215   LABSPEC 1.010 06/01/2016 1215   PHURINE 6.0 06/01/2016 1215   GLUCOSEU NEGATIVE 06/01/2016 1215   HGBUR MODERATE (A) 06/01/2016 1215   BILIRUBINUR NEGATIVE 06/01/2016 1215   KETONESUR 5 (A) 06/01/2016 1215   PROTEINUR 30 (A) 06/01/2016 1215   UROBILINOGEN 2.0 (H) 03/28/2014 1624   NITRITE NEGATIVE 06/01/2016 1215   LEUKOCYTESUR LARGE (A) 06/01/2016 1215   Sepsis Labs Invalid input(s): PROCALCITONIN,  WBC,  LACTICIDVEN Microbiology Recent Results (from the past 240 hour(s))  Blood Culture (routine x 2)     Status: None (Preliminary result)   Collection Time: 06/01/16 11:53 AM  Result Value Ref Range Status   Specimen Description BLOOD RIGHT ANTECUBITAL  Final   Special Requests BOTTLES DRAWN AEROBIC AND ANAEROBIC 5CC  Final   Culture   Final    NO GROWTH 2 DAYS Performed at [facility], [place] [address] [place], [place] [address]    Report Status PENDING  Incomplete  Blood Culture (routine x 2)     Status: Abnormal   Collection Time: 06/01/16 12:10 PM  Result Value Ref Range Status   Specimen Description BLOOD LEFT FOREARM  Final   Special Requests BOTTLES DRAWN AEROBIC AND ANAEROBIC 5CC  Final   Culture  Setup Time   Final    GRAM POSITIVE COCCI IN CLUSTERS IN BOTH AEROBIC AND ANAEROBIC BOTTLES CRITICAL RESULT CALLED TO, READ BACK BY AND VERIFIED WITH: GADHIA, J, WL PHARMD AT 5625 ON 06/03/16 BY JESSUP,  C, MLT.    Culture (A)  Final    AEROCOCCUS SPECIES Standardized susceptibility testing for this organism is not available. Performed at [facility], [place] [address] [place],  [address]    Report Status 06/04/2016 FINAL  Final  Blood Culture ID Panel (Reflexed)     Status: None   Collection Time: 06/01/16 12:10 PM  Result Value Ref Range Status   Enterococcus species NOT DETECTED NOT DETECTED Final   Listeria monocytogenes NOT DETECTED NOT DETECTED Final   Staphylococcus species NOT DETECTED NOT DETECTED Final   Staphylococcus aureus NOT DETECTED NOT DETECTED Final   Streptococcus species NOT DETECTED NOT DETECTED Final   Streptococcus agalactiae NOT DETECTED NOT DETECTED Final   Streptococcus pneumoniae NOT DETECTED NOT DETECTED Final   Streptococcus pyogenes NOT DETECTED NOT DETECTED Final   Acinetobacter baumannii NOT DETECTED NOT DETECTED Final   Enterobacteriaceae species NOT DETECTED NOT DETECTED Final   Enterobacter cloacae complex NOT DETECTED NOT DETECTED Final   Escherichia coli NOT DETECTED NOT DETECTED Final   Klebsiella oxytoca NOT DETECTED NOT DETECTED Final   Klebsiella pneumoniae NOT DETECTED NOT DETECTED Final   Proteus species NOT DETECTED NOT DETECTED Final   Serratia marcescens NOT DETECTED NOT DETECTED Final   Haemophilus influenzae NOT DETECTED NOT DETECTED Final   Neisseria meningitidis NOT DETECTED NOT DETECTED Final   Pseudomonas aeruginosa NOT DETECTED NOT DETECTED Final   Candida albicans NOT DETECTED NOT DETECTED Final   Candida glabrata NOT DETECTED NOT DETECTED Final   Candida krusei NOT DETECTED NOT DETECTED Final   Candida parapsilosis NOT DETECTED NOT DETECTED Final   Candida tropicalis NOT DETECTED NOT DETECTED Final    Comment: Performed at [facility], [address] [place], [place] [address]  Urine culture     Status: Abnormal   Collection Time: 06/01/16 12:15 PM  Result Value Ref Range Status   Specimen Description  URINE, RANDOM  Final   Special Requests NONE  Final   Culture >=100,000 COLONIES/mL AEROCOCCUS URINAE (A)  Final   Report Status 06/03/2016 FINAL  Final     Time coordinating discharge: Over 30 minutes  SIGNED:   Debbe Odea, MD  [facility] 06/04/2016, 1:34 PM Pager   If 7PM-7AM, please contact night-coverage [email or web address] Password [email or web address]

## 2016-06-05 LAB — BLOOD CULTURE ID PANEL (REFLEXED)
ACINETOBACTER BAUMANNII: NOT DETECTED
CANDIDA GLABRATA: NOT DETECTED
Candida albicans: NOT DETECTED
Candida krusei: NOT DETECTED
Candida parapsilosis: NOT DETECTED
Candida tropicalis: NOT DETECTED
ENTEROBACTER CLOACAE COMPLEX: NOT DETECTED
ENTEROCOCCUS SPECIES: NOT DETECTED
Enterobacteriaceae species: NOT DETECTED
Escherichia coli: NOT DETECTED
Haemophilus influenzae: NOT DETECTED
Klebsiella oxytoca: NOT DETECTED
Klebsiella pneumoniae: NOT DETECTED
Listeria monocytogenes: NOT DETECTED
Neisseria meningitidis: NOT DETECTED
PROTEUS SPECIES: NOT DETECTED
PSEUDOMONAS AERUGINOSA: NOT DETECTED
SERRATIA MARCESCENS: NOT DETECTED
STAPHYLOCOCCUS AUREUS BCID: NOT DETECTED
STREPTOCOCCUS AGALACTIAE: NOT DETECTED
STREPTOCOCCUS PYOGENES: NOT DETECTED
STREPTOCOCCUS SPECIES: NOT DETECTED
Staphylococcus species: NOT DETECTED
Streptococcus pneumoniae: NOT DETECTED

## 2016-06-08 LAB — CULTURE, BLOOD (ROUTINE X 2)

## 2016-06-15 ENCOUNTER — Encounter (HOSPITAL_COMMUNITY): Payer: Self-pay

## 2016-06-15 ENCOUNTER — Emergency Department (HOSPITAL_COMMUNITY)
Admission: EM | Admit: 2016-06-15 | Discharge: 2016-06-15 | Disposition: A | Discharge status: Home / Self Care | Payer: Non-veteran care | Attending: Emergency Medicine | Admitting: Emergency Medicine

## 2016-06-15 DIAGNOSIS — R531 Weakness: Secondary | ICD-10-CM | POA: Diagnosis present

## 2016-06-15 DIAGNOSIS — E860 Dehydration: Secondary | ICD-10-CM | POA: Diagnosis not present

## 2016-06-15 DIAGNOSIS — Z79899 Other long term (current) drug therapy: Secondary | ICD-10-CM | POA: Insufficient documentation

## 2016-06-15 DIAGNOSIS — Z8546 Personal history of malignant neoplasm of prostate: Secondary | ICD-10-CM | POA: Diagnosis not present

## 2016-06-15 DIAGNOSIS — I10 Essential (primary) hypertension: Secondary | ICD-10-CM | POA: Diagnosis not present

## 2016-06-15 LAB — URINALYSIS, ROUTINE W REFLEX MICROSCOPIC
Bacteria, UA: NONE SEEN
Bilirubin Urine: NEGATIVE
GLUCOSE, UA: NEGATIVE mg/dL
Hgb urine dipstick: NEGATIVE
Ketones, ur: 20 mg/dL — AB
NITRITE: NEGATIVE
PH: 5 (ref 5.0–8.0)
Protein, ur: 30 mg/dL — AB
Specific Gravity, Urine: 1.025 (ref 1.005–1.030)

## 2016-06-15 LAB — COMPREHENSIVE METABOLIC PANEL
ALBUMIN: 3.8 g/dL (ref 3.5–5.0)
ALT: 16 U/L — ABNORMAL LOW (ref 17–63)
AST: 25 U/L (ref 15–41)
Alkaline Phosphatase: 147 U/L — ABNORMAL HIGH (ref 38–126)
Anion gap: 14 (ref 5–15)
BILIRUBIN TOTAL: 1.8 mg/dL — AB (ref 0.3–1.2)
BUN: 12 mg/dL (ref 6–20)
CO2: 19 mmol/L — AB (ref 22–32)
Calcium: 9.3 mg/dL (ref 8.9–10.3)
Chloride: 99 mmol/L — ABNORMAL LOW (ref 101–111)
Creatinine, Ser: 0.85 mg/dL (ref 0.61–1.24)
GFR calc Af Amer: >60 mL/min (ref >60)
GFR calc non Af Amer: >60 mL/min (ref >60)
GLUCOSE: 90 mg/dL (ref 65–99)
POTASSIUM: 3.7 mmol/L (ref 3.5–5.1)
SODIUM: 132 mmol/L — AB (ref 135–145)
Total Protein: 8.5 g/dL — ABNORMAL HIGH (ref 6.5–8.1)

## 2016-06-15 LAB — I-STAT CG4 LACTIC ACID, ED
Lactic Acid, Venous: 1.16 mmol/L (ref 0.5–1.9)
Lactic Acid, Venous: 0.7 mmol/L (ref 0.5–1.9)

## 2016-06-15 LAB — CBC WITH DIFFERENTIAL/PLATELET
BASOS ABS: 0.1 10*3/uL (ref 0.0–0.1)
Basophils Relative: 0 %
EOS PCT: 1 %
Eosinophils Absolute: 0.2 10*3/uL (ref 0.0–0.7)
HEMATOCRIT: 37.2 % — AB (ref 39.0–52.0)
Hemoglobin: 12.9 g/dL — ABNORMAL LOW (ref 13.0–17.0)
LYMPHS ABS: 1.6 10*3/uL (ref 0.7–4.0)
LYMPHS PCT: 7 %
MCH: 33.3 pg (ref 26.0–34.0)
MCHC: 34.7 g/dL (ref 30.0–36.0)
MCV: 96.1 fL (ref 78.0–100.0)
MONO ABS: 2.2 10*3/uL — AB (ref 0.1–1.0)
MONOS PCT: 10 %
Neutro Abs: 17.7 10*3/uL — ABNORMAL HIGH (ref 1.7–7.7)
Neutrophils Relative %: 82 %
PLATELETS: 245 10*3/uL (ref 150–400)
RBC: 3.87 MIL/uL — ABNORMAL LOW (ref 4.22–5.81)
RDW: 13.9 % (ref 11.5–15.5)
WBC: 21.8 10*3/uL — ABNORMAL HIGH (ref 4.0–10.5)

## 2016-06-15 LAB — I-STAT CHEM 8, ED
BUN: 10 mg/dL (ref 6–20)
BUN: 15 mg/dL (ref 6–20)
CALCIUM ION: 1.13 mmol/L — AB (ref 1.15–1.40)
CALCIUM ION: 1.17 mmol/L (ref 1.15–1.40)
CREATININE: 0.7 mg/dL (ref 0.61–1.24)
Chloride: 99 mmol/L — ABNORMAL LOW (ref 101–111)
Chloride: 99 mmol/L — ABNORMAL LOW (ref 101–111)
Creatinine, Ser: 0.8 mg/dL (ref 0.61–1.24)
GLUCOSE: 78 mg/dL (ref 65–99)
GLUCOSE: 79 mg/dL (ref 65–99)
HCT: 34 % — ABNORMAL LOW (ref 39.0–52.0)
HCT: 38 % — ABNORMAL LOW (ref 39.0–52.0)
HEMOGLOBIN: 12.9 g/dL — AB (ref 13.0–17.0)
Hemoglobin: 11.6 g/dL — ABNORMAL LOW (ref 13.0–17.0)
POTASSIUM: 4 mmol/L (ref 3.5–5.1)
Potassium: 3.7 mmol/L (ref 3.5–5.1)
Sodium: 136 mmol/L (ref 135–145)
Sodium: 136 mmol/L (ref 135–145)
TCO2: 21 mmol/L (ref 0–100)
TCO2: 23 mmol/L (ref 0–100)

## 2016-06-15 MED ORDER — SODIUM CHLORIDE 0.9 % IV BOLUS (SEPSIS)
1000.0000 mL | Freq: Once | INTRAVENOUS | Status: AC
  Administered 2016-06-15: 1000 mL via INTRAVENOUS

## 2016-06-15 NOTE — ED Triage Notes (Signed)
Pt BIB wife c/o low grade fever this morning. Pt has been on antibiotics for the last 2 weeks d/t UTI. Today was the first day without them. [facility] advised the wife to come here for a urinalysis and culture. Pt complaining of some pelvic area pain. Pt has a PICC placed on the right. A&Ox4. Ambulatory with cane.

## 2016-06-15 NOTE — ED Provider Notes (Signed)
[name] DEPT Provider Note   CSN: [id number] Arrival date & time: 06/15/16  1547     History   Chief Complaint Chief Complaint  Patient presents with  . Recurrent UTI  . Fever    HPI Steven Bradley is a 71 y.o. male.Patient reportedly had temperature of 99.5 oral at home earlier today. He is here as he complains of mild generalized weakness. He completed his last dose of home intravenous antibiotics yesterday for urinary tract infection. He denies nausea or vomiting. He does complain of mild generalized weakness. He's had several episodes of diarrhea since being treated with antibiotics however he states diarrhea is slowing down. He denies pain anywhere. No other associated symptoms. Does feel mildly thirsty. Generalized weakness is worse with standing. Treated with sitting down. He does admit to diminished appetite for several days. Other associated symptoms.  HPI  Past Medical History:  Diagnosis Date  . Arthritis   . Enlarged prostate   . GERD (gastroesophageal reflux disease)   . Gout    Patient doesn't recall having gout but was prescribed allopurinol for ankle swelling.  Marland Kitchen Hyperlipidemia   . Hypertension   . Prostate cancer [facility]) June 2015  . PTSD (post-traumatic stress disorder)   . Syncope    Negative outpatient workup.  . Vitamin D deficiency     Patient Active Problem List   Diagnosis Date Noted  . Severe sepsis ([place]) 06/04/2016  . Bacteremia due to Gram-positive bacteria 06/04/2016  . Acute pyelonephritis 06/02/2016  . Hyperlipemia 06/01/2016  . GERD (gastroesophageal reflux disease) 06/01/2016  . Nausea and vomiting 06/01/2016  . Nephrotic syndrome 12/29/2013  . Purpura ([place]) 12/29/2013  . Hypokalemia 12/29/2013  . Hyponatremia 12/29/2013  . Thrombocytopenia ([place]) 12/29/2013  . Essential hypertension 12/29/2013  . Elevated LFTs 12/29/2013    Past Surgical History:  Procedure Laterality Date  . PROSTATE BIOPSY  June 2015  . PROSTATE SURGERY      . Right hand surgery    . Scrapnel removal from scalp     During [place] war       Home Medications    Prior to Admission medications   Medication Sig Start Date End Date Taking? Authorizing Provider  acetaminophen (TYLENOL) 325 MG tablet Take 2 tablets (650 mg total) by mouth every 6 (six) hours as needed for mild pain (or Fever >/= 101). 06/04/16   Debbe Odea, MD  amLODipine (NORVASC) 10 MG tablet Take 10 mg by mouth daily as needed (for BP >150).     Historical Provider, MD  atorvastatin (LIPITOR) 80 MG tablet Take 40 mg by mouth at bedtime.    Historical Provider, MD  cefTRIAXone 2 g in dextrose 5 % 50 mL Inject 2 g into the vein daily. 06/05/16   Debbe Odea, MD  furosemide (LASIX) 40 MG tablet Take 40 mg by mouth daily.    Historical Provider, MD  HYDROcodone-acetaminophen (NORCO) 10-325 MG per tablet Take 1 tablet by mouth every 12 (twelve) hours as needed for moderate pain.     Historical Provider, MD  magnesium oxide (MAGNESIUM-OXIDE) 400 (241.3 Mg) MG tablet Take 400 mg by mouth daily.    Historical Provider, MD  mirtazapine (REMERON) 15 MG tablet Take 15 mg by mouth at bedtime as needed (for sleep/mood).    Historical Provider, MD  Tetrahydroz-Dextran-PEG-Povid (VISINE ADVANCED RELIEF) 0.05-0.1-1-1 % SOLN Place 1 drop into both eyes daily as needed (for irritated eyes).     Historical Provider, MD    Family History  History reviewed. No pertinent family history.  Social History Social History  Substance Use Topics  . Smoking status: Never Smoker  . Smokeless tobacco: Never Used  . Alcohol use Yes     Comment: occasionaly     Allergies   Allopurinol and Vitamin d analogs   Review of Systems Review of Systems  HENT: Negative.   Respiratory: Negative.   Cardiovascular: Positive for chest pain.       Syncope  Gastrointestinal: Negative.   Musculoskeletal: Negative.   Skin: Negative.   Allergic/Immunologic: Positive for immunocompromised state.       History  history of cancer  Neurological: Positive for weakness.  Psychiatric/Behavioral: Negative.   All other systems reviewed and are negative.    Physical Exam Updated Vital Signs BP 129/89   Pulse 94   Temp 98.8 F (37.1 C) (Oral)   Resp 10   Ht 6\' 6"  (1.981 m)   Wt 203 lb (92.1 kg)   SpO2 98%   BMI 23.46 kg/m   Physical Exam  Constitutional: He appears well-developed and well-nourished. No distress.  Mucous membranes dry  HENT:  Head: Normocephalic and atraumatic.  Eyes: Conjunctivae are normal. Pupils are equal, round, and reactive to light.  Neck: Neck supple. No tracheal deviation present. No thyromegaly present.  Cardiovascular: Normal rate and regular rhythm.   No murmur heard. Pulmonary/Chest: Effort normal and breath sounds normal.  Abdominal: Soft. Bowel sounds are normal. He exhibits no distension. There is no tenderness.  Musculoskeletal: Normal range of motion. He exhibits no edema or tenderness.  Neurological: He is alert. Coordination normal.  Skin: Skin is warm and dry. No rash noted.  PICC line in place at right upper extremity. Insertion site not reddened warm or tender  Psychiatric: He has a normal mood and affect.  Nursing note and vitals reviewed.    ED Treatments / Results  Labs (all labs ordered are listed, but only abnormal results are displayed) Labs Reviewed  URINALYSIS, ROUTINE W REFLEX MICROSCOPIC - Abnormal; Notable for the following:       Result Value   Color, Urine AMBER (*)    APPearance HAZY (*)    Ketones, ur 20 (*)    Protein, ur 30 (*)    Leukocytes, UA TRACE (*)    Squamous Epithelial / LPF 0-5 (*)    All other components within normal limits  COMPREHENSIVE METABOLIC PANEL - Abnormal; Notable for the following:    Sodium 132 (*)    Chloride 99 (*)    CO2 19 (*)    Total Protein 8.5 (*)    ALT 16 (*)    Alkaline Phosphatase 147 (*)    Total Bilirubin 1.8 (*)    All other components within normal limits  CBC WITH  DIFFERENTIAL/PLATELET - Abnormal; Notable for the following:    WBC 21.8 (*)    RBC 3.87 (*)    Hemoglobin 12.9 (*)    HCT 37.2 (*)    Neutro Abs 17.7 (*)    Monocytes Absolute 2.2 (*)    All other components within normal limits  URINE CULTURE  I-STAT CG4 LACTIC ACID, ED  I-STAT CG4 LACTIC ACID, ED    EKG  EKG Interpretation None      Results for orders placed or performed during the hospital encounter of 06/15/16  Urinalysis, Routine w reflex microscopic- may I&O cath if menses  Result Value Ref Range   Color, Urine AMBER (A) YELLOW   APPearance HAZY (A) CLEAR  Specific Gravity, Urine 1.025 1.005 - 1.030   pH 5.0 5.0 - 8.0   Glucose, UA NEGATIVE NEGATIVE mg/dL   Hgb urine dipstick NEGATIVE NEGATIVE   Bilirubin Urine NEGATIVE NEGATIVE   Ketones, ur 20 (A) NEGATIVE mg/dL   Protein, ur 30 (A) NEGATIVE mg/dL   Nitrite NEGATIVE NEGATIVE   Leukocytes, UA TRACE (A) NEGATIVE   RBC / HPF 0-5 0 - 5 RBC/hpf   WBC, UA 6-30 0 - 5 WBC/hpf   Bacteria, UA NONE SEEN NONE SEEN   Squamous Epithelial / LPF 0-5 (A) NONE SEEN   Mucous PRESENT    Hyaline Casts, UA PRESENT   Comprehensive metabolic panel  Result Value Ref Range   Sodium 132 (L) 135 - 145 mmol/L   Potassium 3.7 3.5 - 5.1 mmol/L   Chloride 99 (L) 101 - 111 mmol/L   CO2 19 (L) 22 - 32 mmol/L   Glucose, Bld 90 65 - 99 mg/dL   BUN 12 6 - 20 mg/dL   Creatinine, Ser 0.85 0.61 - 1.24 mg/dL   Calcium 9.3 8.9 - 10.3 mg/dL   Total Protein 8.5 (H) 6.5 - 8.1 g/dL   Albumin 3.8 3.5 - 5.0 g/dL   AST 25 15 - 41 U/L   ALT 16 (L) 17 - 63 U/L   Alkaline Phosphatase 147 (H) 38 - 126 U/L   Total Bilirubin 1.8 (H) 0.3 - 1.2 mg/dL   GFR calc non Af Amer >60 >60 mL/min   GFR calc Af Amer >60 >60 mL/min   Anion gap 14 5 - 15  CBC with Differential  Result Value Ref Range   WBC 21.8 (H) 4.0 - 10.5 K/uL   RBC 3.87 (L) 4.22 - 5.81 MIL/uL   Hemoglobin 12.9 (L) 13.0 - 17.0 g/dL   HCT 37.2 (L) 39.0 - 52.0 %   MCV 96.1 78.0 - 100.0 fL    MCH 33.3 26.0 - 34.0 pg   MCHC 34.7 30.0 - 36.0 g/dL   RDW 13.9 11.5 - 15.5 %   Platelets 245 150 - 400 K/uL   Neutrophils Relative % 82 %   Neutro Abs 17.7 (H) 1.7 - 7.7 K/uL   Lymphocytes Relative 7 %   Lymphs Abs 1.6 0.7 - 4.0 K/uL   Monocytes Relative 10 %   Monocytes Absolute 2.2 (H) 0.1 - 1.0 K/uL   Eosinophils Relative 1 %   Eosinophils Absolute 0.2 0.0 - 0.7 K/uL   Basophils Relative 0 %   Basophils Absolute 0.1 0.0 - 0.1 K/uL  I-Stat CG4 Lactic Acid, ED  Result Value Ref Range   Lactic Acid, Venous 1.16 0.5 - 1.9 mmol/L  I-Stat CG4 Lactic Acid, ED  Result Value Ref Range   Lactic Acid, Venous 0.70 0.5 - 1.9 mmol/L  I-Stat Chem 8, ED  Result Value Ref Range   Sodium 136 135 - 145 mmol/L   Potassium 3.7 3.5 - 5.1 mmol/L   Chloride 99 (L) 101 - 111 mmol/L   BUN 15 6 - 20 mg/dL   Creatinine, Ser 0.70 0.61 - 1.24 mg/dL   Glucose, Bld 79 65 - 99 mg/dL   Calcium, Ion 1.17 1.15 - 1.40 mmol/L   TCO2 21 0 - 100 mmol/L   Hemoglobin 11.6 (L) 13.0 - 17.0 g/dL   HCT 34.0 (L) 39.0 - 52.0 %  I-stat chem 8, ed  Result Value Ref Range   Sodium 136 135 - 145 mmol/L   Potassium 4.0 3.5 - 5.1 mmol/L  Chloride 99 (L) 101 - 111 mmol/L   BUN 10 6 - 20 mg/dL   Creatinine, Ser 0.80 0.61 - 1.24 mg/dL   Glucose, Bld 78 65 - 99 mg/dL   Calcium, Ion 1.13 (L) 1.15 - 1.40 mmol/L   TCO2 23 0 - 100 mmol/L   Hemoglobin 12.9 (L) 13.0 - 17.0 g/dL   HCT 38.0 (L) 39.0 - 52.0 %   Dg Chest Port 1 View  Result Date: 06/01/2016 CLINICAL DATA:  Fever, shortness of breath, tachycardia EXAM: PORTABLE CHEST 1 VIEW COMPARISON:  12/24/2015 FINDINGS: Heart and mediastinal contours are within normal limits. No focal opacities or effusions. No acute bony abnormality. IMPRESSION: No active disease. Electronically Signed   By: Rolm Baptise M.D.   On: 06/01/2016 11:35   Ct Renal Stone Study  Result Date: 06/01/2016 CLINICAL DATA:  71 year old hypertensive male with prostate cancer presenting with dysuria,  penile pain, back pain and urinary frequency. Initial encounter. EXAM: CT ABDOMEN AND PELVIS WITHOUT CONTRAST TECHNIQUE: Multidetector CT imaging of the abdomen and pelvis was performed following the standard protocol without IV contrast. COMPARISON:  03/28/2014 and 02/05/2014 CT. FINDINGS: Lower chest: Basilar subsegmental atelectasis with minimal pleural effusions. Prominent coronary artery calcifications. Heart size within normal limits. Hepatobiliary: Fatty liver. Slightly lobular contour. Question cirrhosis. Gallstones. Pancreas: Taking into account limitation by non contrast imaging, no pancreatic mass or pancreatic duct dilation. Spleen: Taking into account limitation by non contrast imaging, no splenic mass or enlargement. Adrenals/Urinary Tract: No adrenal lesion. No renal or ureteral obstructing stone or evidence hydronephrosis. Taking into account limitation by non contrast imaging, no renal mass. Marked circumferential thickening of the bladder. No obvious focal mass although without distention or contrast evaluation limited. Stomach/Bowel: Evaluation of the bowel is limited secondary to under distention and third spacing of fluid. Appearance of circumferential wall thickening of portions of the descending colon, sigmoid colon and rectum may be related to under distension although limited for excluding colitis. Vascular/Lymphatic: Atherosclerotic changes aorta, iliac arteries and femoral arteries without aneurysm. Elongated external iliac lymph nodes without adenopathy. Reproductive: Prior prostate surgery. Other: No free intraperitoneal air. Musculoskeletal: Prominent L4-5 disc space narrowing, endplate reactive changes and sclerosis. This has progressed when compared to 2016 examination and has an appearance most suggestive of degenerative disease rather than infection as there is no surrounding paravertebral abnormality. T9 superior endplate Schmorl's node deformity with slight loss of height stable.  Sclerotic appearance of the femoral heads may be related to degenerative changes. No femoral head irregularity to suggest avascular necrosis. Sclerotic metastatic disease felt be less likely consideration. Sacroiliac joint degenerative changes. IMPRESSION: Prominent circumferential bladder wall thickening. Fatty liver which may be cirrhotic as previously questioned. Third spacing of fluid. Presence of fluid limits evaluation for detection of colitis or cholecystitis. Gallstones. Evaluation of the bowel is limited secondary to under distention and third spacing of fluid. Appearance of circumferential wall thickening of portions of the descending colon, sigmoid colon and rectum may be related to under distension although limited for excluding colitis. No free air. Degenerative changes L4-5 have progressed from the prior exam. Aortic atherosclerosis. Coronary artery calcifications. Sclerotic femoral heads may be related to degenerative changes. Electronically Signed   By: Genia Del M.D.   On: 06/01/2016 18:54    Radiology No results found.  Procedures Procedures (including critical care time)  Medications Ordered in ED Medications  sodium chloride 0.9 % bolus 1,000 mL (not administered)    10 PM patient feels well after treatment with  intravenous hydration. Weakness has improved. There is no definite urinary tract infection. Leukocytosis is not correlate clinically. Urine sent for culture. He has had no documented fever. PICC line will be pulled Initial Impression / Assessment and Plan / ED Course  I have reviewed the triage vital signs and the nursing notes.  Pertinent labs & imaging results that were available during my care of the patient were reviewed by me and considered in my medical decision making (see chart for details).     Plan urine sent for culture. Encourage oral hydration. Keep scheduled appointment at [facility] clinic April 12.  Final Clinical Impressions(s) / ED Diagnoses    Diagnosis dehydration Final diagnoses:  None    New Prescriptions New Prescriptions   No medications on file     Orlie Dakin, MD 06/15/16 2206

## 2016-06-15 NOTE — Discharge Instructions (Signed)
Make sure that you drink at least six 8 ounce glasses of water or Gatorade each day in order to stay well-hydrated. Keep your scheduled appointment at the [facility] clinic on 06/24/2016. Your urine has been sent for culture. We will call you if we detect any infection. Return if you feel worse for any reason

## 2016-06-15 NOTE — ED Notes (Signed)
Bed: GS93 Expected date:  Expected time:  Means of arrival:  Comments: TR1

## 2016-06-17 LAB — URINE CULTURE: Culture: NO GROWTH

## 2018-12-21 ENCOUNTER — Encounter (HOSPITAL_COMMUNITY): Payer: Self-pay

## 2018-12-21 ENCOUNTER — Inpatient Hospital Stay (HOSPITAL_COMMUNITY)
Admission: EM | Admit: 2018-12-21 | Discharge: 2018-12-24 | DRG: 553 | Disposition: A | Discharge status: Home Health | Payer: No Typology Code available for payment source | Attending: Internal Medicine | Admitting: Internal Medicine

## 2018-12-21 ENCOUNTER — Other Ambulatory Visit: Payer: Self-pay

## 2018-12-21 ENCOUNTER — Emergency Department (HOSPITAL_COMMUNITY): Payer: No Typology Code available for payment source

## 2018-12-21 DIAGNOSIS — N400 Enlarged prostate without lower urinary tract symptoms: Secondary | ICD-10-CM | POA: Diagnosis present

## 2018-12-21 DIAGNOSIS — F4310 Post-traumatic stress disorder, unspecified: Secondary | ICD-10-CM | POA: Diagnosis present

## 2018-12-21 DIAGNOSIS — M109 Gout, unspecified: Secondary | ICD-10-CM

## 2018-12-21 DIAGNOSIS — Z79899 Other long term (current) drug therapy: Secondary | ICD-10-CM

## 2018-12-21 DIAGNOSIS — Z8546 Personal history of malignant neoplasm of prostate: Secondary | ICD-10-CM | POA: Diagnosis not present

## 2018-12-21 DIAGNOSIS — A419 Sepsis, unspecified organism: Secondary | ICD-10-CM | POA: Diagnosis present

## 2018-12-21 DIAGNOSIS — I10 Essential (primary) hypertension: Secondary | ICD-10-CM | POA: Diagnosis present

## 2018-12-21 DIAGNOSIS — I5032 Chronic diastolic (congestive) heart failure: Secondary | ICD-10-CM | POA: Diagnosis present

## 2018-12-21 DIAGNOSIS — R509 Fever, unspecified: Secondary | ICD-10-CM | POA: Diagnosis not present

## 2018-12-21 DIAGNOSIS — M1009 Idiopathic gout, multiple sites: Secondary | ICD-10-CM | POA: Diagnosis present

## 2018-12-21 DIAGNOSIS — Z7952 Long term (current) use of systemic steroids: Secondary | ICD-10-CM

## 2018-12-21 DIAGNOSIS — R7989 Other specified abnormal findings of blood chemistry: Secondary | ICD-10-CM | POA: Diagnosis not present

## 2018-12-21 DIAGNOSIS — I110 Hypertensive heart disease with heart failure: Secondary | ICD-10-CM | POA: Diagnosis present

## 2018-12-21 DIAGNOSIS — U071 COVID-19: Secondary | ICD-10-CM | POA: Diagnosis present

## 2018-12-21 DIAGNOSIS — E785 Hyperlipidemia, unspecified: Secondary | ICD-10-CM | POA: Diagnosis present

## 2018-12-21 DIAGNOSIS — K219 Gastro-esophageal reflux disease without esophagitis: Secondary | ICD-10-CM | POA: Diagnosis present

## 2018-12-21 DIAGNOSIS — R6510 Systemic inflammatory response syndrome (SIRS) of non-infectious origin without acute organ dysfunction: Secondary | ICD-10-CM | POA: Diagnosis present

## 2018-12-21 DIAGNOSIS — D7589 Other specified diseases of blood and blood-forming organs: Secondary | ICD-10-CM | POA: Diagnosis present

## 2018-12-21 DIAGNOSIS — M1049 Other secondary gout, multiple sites: Secondary | ICD-10-CM | POA: Diagnosis not present

## 2018-12-21 LAB — CBC WITH DIFFERENTIAL/PLATELET
Abs Immature Granulocytes: 0.16 10*3/uL — ABNORMAL HIGH (ref 0.00–0.07)
Basophils Absolute: 0 10*3/uL (ref 0.0–0.1)
Basophils Relative: 0 %
Eosinophils Absolute: 0 10*3/uL (ref 0.0–0.5)
Eosinophils Relative: 0 %
HCT: 38.5 % — ABNORMAL LOW (ref 39.0–52.0)
Hemoglobin: 13.1 g/dL (ref 13.0–17.0)
Immature Granulocytes: 1 %
Lymphocytes Relative: 6 %
Lymphs Abs: 0.9 10*3/uL (ref 0.7–4.0)
MCH: 36.3 pg — ABNORMAL HIGH (ref 26.0–34.0)
MCHC: 34 g/dL (ref 30.0–36.0)
MCV: 106.6 fL — ABNORMAL HIGH (ref 80.0–100.0)
Monocytes Absolute: 1 10*3/uL (ref 0.1–1.0)
Monocytes Relative: 7 %
Neutro Abs: 13.4 10*3/uL — ABNORMAL HIGH (ref 1.7–7.7)
Neutrophils Relative %: 86 %
Platelets: 182 10*3/uL (ref 150–400)
RBC: 3.61 MIL/uL — ABNORMAL LOW (ref 4.22–5.81)
RDW: 13.2 % (ref 11.5–15.5)
WBC: 15.6 10*3/uL — ABNORMAL HIGH (ref 4.0–10.5)
nRBC: 0 % (ref 0.0–0.2)

## 2018-12-21 LAB — PROCALCITONIN: Procalcitonin: 0.54 ng/mL

## 2018-12-21 LAB — COMPREHENSIVE METABOLIC PANEL
ALT: 31 U/L (ref 0–44)
AST: 63 U/L — ABNORMAL HIGH (ref 15–41)
Albumin: 3.6 g/dL (ref 3.5–5.0)
Alkaline Phosphatase: 132 U/L — ABNORMAL HIGH (ref 38–126)
Anion gap: 16 — ABNORMAL HIGH (ref 5–15)
BUN: 13 mg/dL (ref 8–23)
CO2: 23 mmol/L (ref 22–32)
Calcium: 9 mg/dL (ref 8.9–10.3)
Chloride: 94 mmol/L — ABNORMAL LOW (ref 98–111)
Creatinine, Ser: 0.91 mg/dL (ref 0.61–1.24)
GFR calc Af Amer: >60 mL/min (ref >60)
GFR calc non Af Amer: >60 mL/min (ref >60)
Glucose, Bld: 136 mg/dL — ABNORMAL HIGH (ref 70–99)
Potassium: 3.6 mmol/L (ref 3.5–5.1)
Sodium: 133 mmol/L — ABNORMAL LOW (ref 135–145)
Total Bilirubin: 4 mg/dL — ABNORMAL HIGH (ref 0.3–1.2)
Total Protein: 9 g/dL — ABNORMAL HIGH (ref 6.5–8.1)

## 2018-12-21 LAB — URINALYSIS, ROUTINE W REFLEX MICROSCOPIC
Glucose, UA: NEGATIVE mg/dL
Hgb urine dipstick: NEGATIVE
Ketones, ur: 5 mg/dL — AB
Leukocytes,Ua: NEGATIVE
Nitrite: NEGATIVE
Protein, ur: 100 mg/dL — AB
Specific Gravity, Urine: 1.026 (ref 1.005–1.030)
pH: 6 (ref 5.0–8.0)

## 2018-12-21 LAB — C-REACTIVE PROTEIN: CRP: 11.3 mg/dL — ABNORMAL HIGH (ref <1.0)

## 2018-12-21 LAB — LACTIC ACID, PLASMA
Lactic Acid, Venous: 2.5 mmol/L (ref 0.5–1.9)
Lactic Acid, Venous: 1.7 mmol/L (ref 0.5–1.9)

## 2018-12-21 LAB — LACTATE DEHYDROGENASE: LDH: 101 U/L (ref 98–192)

## 2018-12-21 LAB — SEDIMENTATION RATE: Sed Rate: 115 mm/hr — ABNORMAL HIGH (ref 0–16)

## 2018-12-21 LAB — D-DIMER, QUANTITATIVE: D-Dimer, Quant: 3.3 ug/mL-FEU — ABNORMAL HIGH (ref 0.00–0.50)

## 2018-12-21 LAB — FERRITIN: Ferritin: 740 ng/mL — ABNORMAL HIGH (ref 24–336)

## 2018-12-21 LAB — APTT: aPTT: 34 seconds (ref 24–36)

## 2018-12-21 LAB — SARS CORONAVIRUS 2 BY RT PCR (HOSPITAL ORDER, PERFORMED IN ~~LOC~~ HOSPITAL LAB): SARS Coronavirus 2: POSITIVE — AB

## 2018-12-21 LAB — PROTIME-INR
INR: 1.2 (ref 0.8–1.2)
Prothrombin Time: 15.2 seconds (ref 11.4–15.2)

## 2018-12-21 LAB — FIBRINOGEN: Fibrinogen: 794 mg/dL — ABNORMAL HIGH (ref 210–475)

## 2018-12-21 MED ORDER — VANCOMYCIN HCL IN DEXTROSE 1-5 GM/200ML-% IV SOLN: 1000.0000 mg | Freq: Once | INTRAVENOUS | Status: DC

## 2018-12-21 MED ORDER — SODIUM CHLORIDE 0.9 % IV SOLN
1.0000 g | INTRAVENOUS | Status: DC
  Administered 2018-12-21: 1 g via INTRAVENOUS
  Filled 2018-12-21 (×3): qty 10

## 2018-12-21 MED ORDER — MORPHINE SULFATE (PF) 4 MG/ML IV SOLN
4.0000 mg | Freq: Once | INTRAVENOUS | Status: AC
  Administered 2018-12-21: 4 mg via INTRAVENOUS
  Filled 2018-12-21: qty 1

## 2018-12-21 MED ORDER — ACETAMINOPHEN 500 MG PO TABS
1000.0000 mg | ORAL_TABLET | Freq: Once | ORAL | Status: AC
  Administered 2018-12-21: 1000 mg via ORAL
  Filled 2018-12-21: qty 2

## 2018-12-21 MED ORDER — LACTATED RINGERS IV BOLUS (SEPSIS)
2700.0000 mL | Freq: Once | INTRAVENOUS | Status: AC
  Administered 2018-12-21: 2700 mL via INTRAVENOUS

## 2018-12-21 MED ORDER — HYDROCOD POLST-CPM POLST ER 10-8 MG/5ML PO SUER: 5.0000 mL | Freq: Two times a day (BID) | ORAL | Status: DC | PRN

## 2018-12-21 MED ORDER — ZINC SULFATE 220 (50 ZN) MG PO CAPS
220.0000 mg | ORAL_CAPSULE | Freq: Every day | ORAL | Status: DC
  Administered 2018-12-21 – 2018-12-24 (×4): 220 mg via ORAL
  Filled 2018-12-21 (×5): qty 1

## 2018-12-21 MED ORDER — LACTATED RINGERS IV SOLN
INTRAVENOUS | Status: DC
  Administered 2018-12-21 – 2018-12-22 (×3): via INTRAVENOUS

## 2018-12-21 MED ORDER — VANCOMYCIN HCL 10 G IV SOLR
2000.0000 mg | Freq: Once | INTRAVENOUS | Status: AC
  Administered 2018-12-21: 2000 mg via INTRAVENOUS
  Filled 2018-12-21: qty 2000

## 2018-12-21 MED ORDER — GUAIFENESIN-DM 100-10 MG/5ML PO SYRP: 10.0000 mL | ORAL_SOLUTION | ORAL | Status: DC | PRN

## 2018-12-21 MED ORDER — METRONIDAZOLE IN NACL 5-0.79 MG/ML-% IV SOLN
500.0000 mg | Freq: Once | INTRAVENOUS | Status: AC
  Administered 2018-12-21: 500 mg via INTRAVENOUS
  Filled 2018-12-21: qty 100

## 2018-12-21 MED ORDER — DOCUSATE SODIUM 100 MG PO CAPS
100.0000 mg | ORAL_CAPSULE | Freq: Two times a day (BID) | ORAL | Status: DC
  Administered 2018-12-21 – 2018-12-24 (×6): 100 mg via ORAL
  Filled 2018-12-21 (×6): qty 1

## 2018-12-21 MED ORDER — VITAMIN C 500 MG PO TABS
500.0000 mg | ORAL_TABLET | Freq: Every day | ORAL | Status: DC
  Administered 2018-12-21 – 2018-12-24 (×4): 500 mg via ORAL
  Filled 2018-12-21 (×4): qty 1

## 2018-12-21 MED ORDER — HYDROCODONE-ACETAMINOPHEN 5-325 MG PO TABS
2.0000 | ORAL_TABLET | Freq: Two times a day (BID) | ORAL | Status: DC | PRN
  Administered 2018-12-22 – 2018-12-23 (×3): 2 via ORAL
  Filled 2018-12-21 (×3): qty 2

## 2018-12-21 MED ORDER — ONDANSETRON HCL 4 MG PO TABS: 4.0000 mg | ORAL_TABLET | Freq: Four times a day (QID) | ORAL | Status: DC | PRN

## 2018-12-21 MED ORDER — ONDANSETRON HCL 4 MG/2ML IJ SOLN: 4.0000 mg | Freq: Four times a day (QID) | INTRAMUSCULAR | Status: DC | PRN

## 2018-12-21 MED ORDER — ENOXAPARIN SODIUM 40 MG/0.4ML ~~LOC~~ SOLN
40.0000 mg | SUBCUTANEOUS | Status: DC
  Administered 2018-12-21 – 2018-12-23 (×3): 40 mg via SUBCUTANEOUS
  Filled 2018-12-21 (×3): qty 0.4

## 2018-12-21 NOTE — ED Notes (Signed)
[facility] called. They stated that the patients name is already on their board. They were called previously at 530p for transport.

## 2018-12-21 NOTE — ED Notes (Signed)
[facility] arrives to transport patient.

## 2018-12-21 NOTE — Plan of Care (Signed)
  Problem: Education: Goal: Knowledge of risk factors and measures for prevention of condition will improve Outcome: Progressing   Problem: Coping: Goal: Psychosocial and spiritual needs will be supported Outcome: Progressing   Problem: Respiratory: Goal: Will maintain a patent airway Outcome: Progressing Goal: Complications related to the disease process, condition or treatment will be avoided or minimized Outcome: Progressing   

## 2018-12-21 NOTE — Progress Notes (Signed)
A consult was received from an ED physician for vancomycin per pharmacy dosing.  The patient's profile has been reviewed for ht/wt/allergies/indication/available labs.  Wt = 102.5 kg, no antibiotic allergies.  A one time order has been placed for vancomycin 2000 mg IV once.    Further antibiotics/pharmacy consults should be ordered by admitting physician if indicated.                       Thank you, Lenis Noon, PharmD 12/21/2018  11:34 AM

## 2018-12-21 NOTE — H&P (Signed)
[facility] History and Physical   Patient: Steven Bradley [id number]   PCP: [facility] DOB: [birth year]   DOA: 12/21/2018   DOS: 12/21/2018   DOS: the patient was seen and examined on 12/21/2018  Patient coming from: The patient is coming from Home  Chief Complaint: Joint pain  HPI: Steven Bradley is a 73 y.o. male with Past medical history of GERD, HTN, HLD, PTSD. Patient presents with complaints of multiple joint pains as well as swelling. He reports that he noticed 3-day history of progressively worsening joint pain in his right ankle right knee right hip right elbow. Denies any fall trauma or injury prevention he also started having fever since yesterday and some chills since yesterday.  No nausea no vomiting no cough no shortness of breath no chest pain abdominal pain.  No diarrhea no constipation. Patient was started on a new medication by [facility] for his pain control which he started taking 2 days ago. No other recent change in medications reported. No smoking.  No alcohol abuse for now. No other acute complaint at the time of my evaluation.  ED Course: Presents with complaints of joint pain.  Initial work-up was positive for leukocytosis as well as fever.  Concern for sepsis and initial work-up was unremarkable.  SARS-CoV-2 test came back positive.  Patient was referred for admission.  At his baseline ambulates with assistance independent for most of his ADL;  manages his medication on his own.  Review of Systems: as mentioned in the history of present illness.  All other systems reviewed and are negative.  Past Medical History:  Diagnosis Date  . Arthritis   . Enlarged prostate   . GERD (gastroesophageal reflux disease)   . Gout    Patient doesn't recall having gout but was prescribed allopurinol for ankle swelling.  Marland Kitchen Hyperlipidemia   . Hypertension   . Prostate cancer [facility]) June 2015  . PTSD (post-traumatic stress disorder)   . Syncope    Negative outpatient workup.  . Vitamin D deficiency    Past Surgical History:  Procedure Laterality Date  . PROSTATE BIOPSY  June 2015  . PROSTATE SURGERY    . Right hand surgery    . Scrapnel removal from scalp     During [place] war   Social History:  reports that he has never smoked. He has never used smokeless tobacco. He reports current alcohol use. He reports that he does not use drugs.  Allergies  Allergen Reactions  . Allopurinol Rash  . Vitamin D Analogs Rash   Family history reviewed and not pertinent History reviewed. No pertinent family history.   Prior to Admission medications   Medication Sig Start Date End Date Taking? Authorizing Provider  amLODipine (NORVASC) 10 MG tablet Take 10 mg by mouth daily as needed (for BP >150).    Yes [provider]  atorvastatin (LIPITOR) 80 MG tablet Take 40 mg by mouth at bedtime.   Yes [provider]  furosemide (LASIX) 40 MG tablet Take 40 mg by mouth daily.   Yes [provider]  gabapentin (NEURONTIN) 100 MG capsule Take 100 mg by mouth 2 (two) times daily.   Yes [provider]  HYDROcodone-acetaminophen (NORCO) 10-325 MG per tablet Take 1 tablet by mouth every 12 (twelve) hours as needed for moderate pain.    Yes [provider]  ibuprofen (ADVIL) 200 MG tablet Take 200 mg by mouth every 6 (six) hours as needed for fever or moderate  pain.   Yes [provider]  magnesium oxide (MAGNESIUM-OXIDE) 400 (241.3 Mg) MG tablet Take 400 mg by mouth daily.   Yes [provider]  Multiple Vitamin (MULTIVITAMIN WITH MINERALS) TABS tablet Take 1 tablet by mouth daily.   Yes [provider]  omeprazole (PRILOSEC) 20 MG capsule Take 20 mg by mouth as needed (indigestion).   Yes [provider]  predniSONE (DELTASONE) 5 MG tablet Take 5 mg by mouth daily as needed (gout).   Yes [provider]  sertraline (ZOLOFT) 100 MG tablet Take 100 mg by mouth daily.    Yes [provider]  Tetrahydroz-Dextran-PEG-Povid (VISINE ADVANCED RELIEF) 0.05-0.1-1-1 % SOLN Place 1 drop into both eyes daily as needed (for irritated eyes).    Yes [provider]  traZODone (DESYREL) 100 MG tablet Take 100 mg by mouth at bedtime.   Yes [provider]  acetaminophen (TYLENOL) 325 MG tablet Take 2 tablets (650 mg total) by mouth every 6 (six) hours as needed for mild pain (or Fever >/= 101). Patient not taking: Reported on 06/15/2016 06/04/16   Debbe Odea, MD  cefTRIAXone 2 g in dextrose 5 % 50 mL Inject 2 g into the vein daily. Patient not taking: Reported on 06/15/2016 06/05/16   Debbe Odea, MD    Physical Exam: Vitals:   12/21/18 1700 12/21/18 1730 12/21/18 1800 12/21/18 1830  BP: (!) 149/99 (!) 136/94 (!) 143/93 (!) 141/98  Pulse: 95 88 88 91  Resp: (!) 23 (!) 21 (!) 23 19  Temp:      TempSrc:      SpO2: 100% 100% 100% 98%  Weight:      Height:        General: alert and oriented to time, place, and person. Appear in mild distress, affect appropriate Eyes: PERRL, Conjunctiva normal ENT: Oral Mucosa Clear, moist  Neck: no JVD, no Abnormal Mass Or lumps Cardiovascular: S1 and S2 Present, no Murmur, peripheral pulses symmetrical Respiratory: good respiratory effort, Bilateral Air entry equal and Decreased, no signs of accessory muscle use, Clear to Auscultation, no Crackles, no wheezes Abdomen: Bowel Sound present, Soft and no tenderness, no hernia Skin: no rashes  Extremities: no Pedal edema, no calf tenderness Neurologic: without any new focal findings Gait not checked due to patient safety concerns  Data Reviewed: I have personally reviewed and interpreted labs, imaging as discussed below.  CBC: Recent Labs  Lab 12/21/18 1051  WBC 15.6*  NEUTROABS 13.4*  HGB 13.1  HCT 38.5*  MCV 106.6*  PLT [id number]   Basic Metabolic Panel: Recent Labs  Lab 12/21/18 1051  NA 133*  K 3.6  CL 94*  CO2 23  GLUCOSE 136*  BUN 13   CREATININE 0.91  CALCIUM 9.0   GFR: Estimated Creatinine Clearance: 96.3 mL/min (by C-G formula based on SCr of 0.91 mg/dL). Liver Function Tests: Recent Labs  Lab 12/21/18 1051  AST 63*  ALT 31  ALKPHOS 132*  BILITOT 4.0*  PROT 9.0*  ALBUMIN 3.6   No results for input(s): LIPASE, AMYLASE in the last 168 hours. No results for input(s): AMMONIA in the last 168 hours. Coagulation Profile: Recent Labs  Lab 12/21/18 1209  INR 1.2   Cardiac Enzymes: No results for input(s): CKTOTAL, CKMB, CKMBINDEX, TROPONINI in the last 168 hours. BNP (last 3 results) No results for input(s): PROBNP in the last 8760 hours. HbA1C: No results for input(s): HGBA1C in the last 72 hours. CBG: No results for input(s): GLUCAP in  the last 168 hours. Lipid Profile: No results for input(s): CHOL, HDL, LDLCALC, TRIG, CHOLHDL, LDLDIRECT in the last 72 hours. Thyroid Function Tests: No results for input(s): TSH, T4TOTAL, FREET4, T3FREE, THYROIDAB in the last 72 hours. Anemia Panel: No results for input(s): VITAMINB12, FOLATE, FERRITIN, TIBC, IRON, RETICCTPCT in the last 72 hours. Urine analysis:    Component Value Date/Time   COLORURINE AMBER (A) 12/21/2018 1511   APPEARANCEUR HAZY (A) 12/21/2018 1511   LABSPEC 1.026 12/21/2018 1511   PHURINE 6.0 12/21/2018 1511   GLUCOSEU NEGATIVE 12/21/2018 1511   HGBUR NEGATIVE 12/21/2018 1511   BILIRUBINUR SMALL (A) 12/21/2018 1511   KETONESUR 5 (A) 12/21/2018 1511   PROTEINUR 100 (A) 12/21/2018 1511   UROBILINOGEN 2.0 (H) 03/28/2014 1624   NITRITE NEGATIVE 12/21/2018 1511   LEUKOCYTESUR NEGATIVE 12/21/2018 1511    Radiological Exams on Admission: Dg Chest Port 1 View  Result Date: 12/21/2018 CLINICAL DATA:  Shortness of breath. EXAM: PORTABLE CHEST 1 VIEW COMPARISON:  Radiograph June 01, 2016. FINDINGS: The heart size and mediastinal contours are within normal limits. Both lungs are clear. No pneumothorax or pleural effusion is noted. The visualized  skeletal structures are unremarkable. IMPRESSION: No active disease. Electronically Signed   By: Marijo Conception M.D.   On: 12/21/2018 12:49    I reviewed all nursing notes, pharmacy notes, vitals, pertinent old records.  Assessment/Plan 1. Sepsis ([place]) Cellulitis versus acute COVID-19 viral infection. Patient reports multiple joint pain but no significant swelling of the joint other than some knee swelling. Blood cultures performed.  We will also perform inflammatory marker work-up. Check procalcitonin as well. We will continue with ceftriaxone for now. No indication to initiate Remdesivir or steroids. We will transfer to telemetry at [facility]..  2.  Elevated LFTs. In the setting of infection. Monitor.  3.  Essential hypertension. Chronic diastolic CHF. On Lasix at home currently holding and providing the patient with gentle IV hydration. Also holding Norvasc for now.  4.  History of gout. Polyarthralgia. This could be gout. Will monitor blood If the symptoms worsen despite treatment with antibiotics patient will be requiring prednisone which he takes at home for his gout flareups.  5.  Hyperlipidemia. Holding Lipitor in the setting of mild elevated LFT. Resume when possible.   Nutrition: Regular diet DVT Prophylaxis: Subcutaneous Lovenox  Advance goals of care discussion: Full code   Consults: none   Family Communication: no family was present at bedside, at the time of interview.   Disposition: Admitted as inpatient, telemetry unit. Likely to be discharged home, in 2-3 days.  I have discussed plan of care as described above with RN and patient/family.  Author: Berle Mull, MD [facility] 12/21/2018 7:50 PM   To reach On-call, see care teams to locate the attending and reach out to them via [email or web address]. If 7PM-7AM, please contact night-coverage If you still have difficulty reaching the attending provider, please page the [facility] (Director on Call) for [facility] on amion for assistance.

## 2018-12-21 NOTE — ED Notes (Signed)
Attempted to call report to [facility]. Was transferred to a phone with no answer or VM.

## 2018-12-21 NOTE — ED Notes (Addendum)
ED TO INPATIENT HANDOFF REPORT  ED Nurse Name and Phone #: Fidela Juneau Name/Age/Gender Steven Bradley 73 y.o. male Room/Bed: WA15/WA15  Code Status   Code Status: Full Code  Home/SNF/Other Home Patient oriented to: self, place, time and situation Is this baseline? Yes   Triage Complete: Triage complete  Chief Complaint gout dysuria  Triage Note Per EMS, pt from home, hx of gout pain, increased urination/dysuria. Usually ambulatory, but needed to be carried down stairs by EMS today. Pt said urination symptoms started a few days ago and pain started last night.    Allergies Allergies  Allergen Reactions  . Allopurinol Rash  . Vitamin D Analogs Rash    Level of Care/Admitting Diagnosis ED Disposition    ED Disposition Condition [facility] Area: [place] [[id number]]  Level of Care: Telemetry [5]  Covid Evaluation: Confirmed COVID Positive  [facility]: [id number]  Admitting Physician: Lavina Hamman [id number]  Attending Physician: Lavina Hamman [id number]  Estimated length of stay: past midnight tomorrow  Certification:: I certify this patient will need inpatient services for at least 2 midnights  PT Class (Do Not Modify): Inpatient [101]  PT Acc Code (Do Not Modify): Private [1]       B Medical/Surgery History Past Medical History:  Diagnosis Date  . Arthritis   . Enlarged prostate   . GERD (gastroesophageal reflux disease)   . Gout    Patient doesn't recall having gout but was prescribed allopurinol for ankle swelling.  Marland Kitchen Hyperlipidemia   . Hypertension   . Prostate cancer [facility]) June 2015  . PTSD (post-traumatic stress disorder)   . Syncope    Negative outpatient workup.  . Vitamin D deficiency    Past Surgical History:  Procedure Laterality Date  . PROSTATE BIOPSY  June 2015  . PROSTATE SURGERY    . Right hand surgery    . Scrapnel removal from scalp     During [place] war     A IV  Location/Drains/Wounds Patient Lines/Drains/Airways Status   Active Line/Drains/Airways    Name:   Placement date:   Placement time:   Site:   Days:   Peripheral IV 12/21/18 Left Forearm   12/21/18    1037    Forearm   less than 1          Intake/Output Last 24 hours  Intake/Output Summary (Last 24 hours) at 12/21/2018 2004 Last data filed at 12/21/2018 1800 Gross per 24 hour  Intake 3350 ml  Output -  Net 3350 ml    Labs/Imaging Results for orders placed or performed during the hospital encounter of 12/21/18 (from the past 48 hour(s))  Lactic acid, plasma     Status: Abnormal   Collection Time: 12/21/18 10:48 AM  Result Value Ref Range   Lactic Acid, Venous 2.5 (HH) 0.5 - 1.9 mmol/L    Comment: CRITICAL RESULT CALLED TO, READ BACK BY AND VERIFIED WITH: DOWD, P 1140 12/21/18 JM Performed at [facility], [place] [address], [place], [place] [address]   SARS Coronavirus 2 by RT PCR (hospital order, performed in [facility] hospital lab) Nasopharyngeal Nasopharyngeal Swab     Status: Abnormal   Collection Time: 12/21/18 10:50 AM   Specimen: Nasopharyngeal Swab  Result Value Ref Range   SARS Coronavirus 2 POSITIVE (A) NEGATIVE    Comment: RESULT CALLED TO, READ BACK BY AND VERIFIED WITH: N.MOSCINSKI AT [id number] ON 12/21/18 BY  N.THOMPSON (NOTE) If result is NEGATIVE SARS-CoV-2 target nucleic acids are NOT DETECTED. The SARS-CoV-2 RNA is generally detectable in upper and lower  respiratory specimens during the acute phase of infection. The lowest  concentration of SARS-CoV-2 viral copies this assay can detect is 250  copies / mL. A negative result does not preclude SARS-CoV-2 infection  and should not be used as the sole basis for treatment or other  patient management decisions.  A negative result may occur with  improper specimen collection / handling, submission of specimen other  than nasopharyngeal swab, presence of viral mutation(s) within the  areas targeted  by this assay, and inadequate number of viral copies  (<250 copies / mL). A negative result must be combined with clinical  observations, patient history, and epidemiological information. If result is POSITIVE SARS-CoV-2 target nucleic acids are DET ECTED. The SARS-CoV-2 RNA is generally detectable in upper and lower  respiratory specimens during the acute phase of infection.  Positive  results are indicative of active infection with SARS-CoV-2.  Clinical  correlation with patient history and other diagnostic information is  necessary to determine patient infection status.  Positive results do  not rule out bacterial infection or co-infection with other viruses. If result is PRESUMPTIVE POSTIVE SARS-CoV-2 nucleic acids MAY BE PRESENT.   A presumptive positive result was obtained on the submitted specimen  and confirmed on repeat testing.  While 2019 novel coronavirus  (SARS-CoV-2) nucleic acids may be present in the submitted sample  additional confirmatory testing may be necessary for epidemiological  and / or clinical management purposes  to differentiate between  SARS-CoV-2 and other Sarbecovirus currently known to infect humans.  If clinically indicated additional testing with an alternate test  methodology ([place]) is advised. The SARS-CoV-2 RNA is generally  detectable in upper and lower respiratory specimens during the acute  phase of infection. The expected result is Negative. Fact Sheet for Patients:  StrictlyIdeas.no Fact Sheet for Healthcare Providers: BankingDealers.co.za This test is not yet approved or cleared by the [place] FDA and has been authorized for detection and/or diagnosis of SARS-CoV-2 by FDA under an Emergency Use Authorization (EUA).  This EUA will remain in effect (meaning this test can be used) for the duration of the COVID-19 declaration under Section 564(b)(1) of the Act, 21 U.S.C. section  360bbb-3(b)(1), unless the authorization is terminated or revoked sooner. Performed at [facility], [place] [address] [place], [place] [address]   Comprehensive metabolic panel     Status: Abnormal   Collection Time: 12/21/18 10:51 AM  Result Value Ref Range   Sodium 133 (L) 135 - 145 mmol/L   Potassium 3.6 3.5 - 5.1 mmol/L   Chloride 94 (L) 98 - 111 mmol/L   CO2 23 22 - 32 mmol/L   Glucose, Bld 136 (H) 70 - 99 mg/dL   BUN 13 8 - 23 mg/dL   Creatinine, Ser 0.91 0.61 - 1.24 mg/dL   Calcium 9.0 8.9 - 10.3 mg/dL   Total Protein 9.0 (H) 6.5 - 8.1 g/dL   Albumin 3.6 3.5 - 5.0 g/dL   AST 63 (H) 15 - 41 U/L   ALT 31 0 - 44 U/L   Alkaline Phosphatase 132 (H) 38 - 126 U/L   Total Bilirubin 4.0 (H) 0.3 - 1.2 mg/dL   GFR calc non Af Amer >60 >60 mL/min   GFR calc Af Amer >60 >60 mL/min   Anion gap 16 (H) 5 - 15  Comment: Performed at [facility], [place] [address] [place], [place] [address]  CBC WITH DIFFERENTIAL     Status: Abnormal   Collection Time: 12/21/18 10:51 AM  Result Value Ref Range   WBC 15.6 (H) 4.0 - 10.5 K/uL   RBC 3.61 (L) 4.22 - 5.81 MIL/uL   Hemoglobin 13.1 13.0 - 17.0 g/dL   HCT 38.5 (L) 39.0 - 52.0 %   MCV 106.6 (H) 80.0 - 100.0 fL   MCH 36.3 (H) 26.0 - 34.0 pg   MCHC 34.0 30.0 - 36.0 g/dL   RDW 13.2 11.5 - 15.5 %   Platelets 182 150 - 400 K/uL   nRBC 0.0 0.0 - 0.2 %   Neutrophils Relative % 86 %   Neutro Abs 13.4 (H) 1.7 - 7.7 K/uL   Lymphocytes Relative 6 %   Lymphs Abs 0.9 0.7 - 4.0 K/uL   Monocytes Relative 7 %   Monocytes Absolute 1.0 0.1 - 1.0 K/uL   Eosinophils Relative 0 %   Eosinophils Absolute 0.0 0.0 - 0.5 K/uL   Basophils Relative 0 %   Basophils Absolute 0.0 0.0 - 0.1 K/uL   Immature Granulocytes 1 %   Abs Immature Granulocytes 0.16 (H) 0.00 - 0.07 K/uL    Comment: Performed at [facility], [place] [address] [place], [place] [address]  Protime-INR     Status: None   Collection Time:  12/21/18 12:09 PM  Result Value Ref Range   Prothrombin Time 15.2 11.4 - 15.2 seconds   INR 1.2 0.8 - 1.2    Comment: (NOTE) INR goal varies based on device and disease states. Performed at [facility], [place] [address], [place], [place] [address]   APTT     Status: None   Collection Time: 12/21/18 12:09 PM  Result Value Ref Range   aPTT 34 24 - 36 seconds    Comment: Performed at [facility], [place] [address] [place], [place] [address]  Urinalysis, Routine w reflex microscopic     Status: Abnormal   Collection Time: 12/21/18  3:11 PM  Result Value Ref Range   Color, Urine AMBER (A) YELLOW    Comment: BIOCHEMICALS MAY BE AFFECTED BY COLOR   APPearance HAZY (A) CLEAR   Specific Gravity, Urine 1.026 1.005 - 1.030   pH 6.0 5.0 - 8.0   Glucose, UA NEGATIVE NEGATIVE mg/dL   Hgb urine dipstick NEGATIVE NEGATIVE   Bilirubin Urine SMALL (A) NEGATIVE   Ketones, ur 5 (A) NEGATIVE mg/dL   Protein, ur 100 (A) NEGATIVE mg/dL   Nitrite NEGATIVE NEGATIVE   Leukocytes,Ua NEGATIVE NEGATIVE   RBC / HPF 0-5 0 - 5 RBC/hpf   WBC, UA 6-10 0 - 5 WBC/hpf   Bacteria, UA RARE (A) NONE SEEN   Squamous Epithelial / LPF 6-10 0 - 5   Mucus PRESENT     Comment: Performed at [facility], [place] [address] [place], [place] [address]  Lactic acid, plasma     Status: None   Collection Time: 12/21/18  4:08 PM  Result Value Ref Range   Lactic Acid, Venous 1.7 0.5 - 1.9 mmol/L    Comment: Performed at [facility], [place] [address] [place], [place] [address]  C-reactive protein     Status: Abnormal   Collection Time: 12/21/18  6:29 PM  Result Value Ref Range   CRP 11.3 (H) <1.0 mg/dL    Comment: Performed at [facility], [place] [address] [place], [place] [address]  Ferritin     Status: Abnormal   Collection Time: 12/21/18  6:29 PM  Result Value Ref Range   Ferritin 740 (H) 24 - 336 ng/mL    Comment: Performed at  [facility], [place] [address] [place], [place] [address]  D-dimer, quantitative (not at [facility])     Status: Abnormal   Collection Time: 12/21/18  6:58 PM  Result Value Ref Range   D-Dimer, Quant 3.30 (H) 0.00 - 0.50 ug/mL-FEU    Comment: (NOTE) At the manufacturer cut-off of 0.50 ug/mL FEU, this assay has been documented to exclude PE with a sensitivity and negative predictive value of 97 to 99%.  At this time, this assay has not been approved by the FDA to exclude DVT/VTE. Results should be correlated with clinical presentation. Performed at [facility], [place] [address] [place], [place] [address]   Fibrinogen     Status: Abnormal   Collection Time: 12/21/18  6:58 PM  Result Value Ref Range   Fibrinogen 794 (H) 210 - 475 mg/dL    Comment: Performed at [facility], [place] [address], [place], [place] [address]  Lactate dehydrogenase     Status: None   Collection Time: 12/21/18  6:58 PM  Result Value Ref Range   LDH 101 98 - 192 U/L    Comment: Performed at [facility], [place] [address] [place], [place] [address]   Dg Chest Port 1 View  Result Date: 12/21/2018 CLINICAL DATA:  Shortness of breath. EXAM: PORTABLE CHEST 1 VIEW COMPARISON:  Radiograph June 01, 2016. FINDINGS: The heart size and mediastinal contours are within normal limits. Both lungs are clear. No pneumothorax or pleural effusion is noted. The visualized skeletal structures are unremarkable. IMPRESSION: No active disease. Electronically Signed   By: Marijo Conception M.D.   On: 12/21/2018 12:49    Pending Labs Unresulted Labs (From admission, onward)    Start     Ordered   12/22/18 0500  CBC with Differential/Platelet  Daily,   R     12/21/18 1734   12/22/18 0500  Comprehensive metabolic panel  Daily,   R     12/21/18 1734   12/22/18 0500  C-reactive protein  Daily,   R     12/21/18 1734   12/22/18 0500  D-dimer, quantitative (not at [facility])   Daily,   R     12/21/18 1734   12/22/18 0500  Ferritin  Daily,   R     12/21/18 1734   12/22/18 0500  Magnesium  Daily,   R     12/21/18 1734   12/21/18 1727  Sedimentation rate  Once,   STAT     12/21/18 1734   12/21/18 1726  Procalcitonin  Once,   STAT     12/21/18 1734   12/21/18 1048  Blood Culture (routine x 2)  BLOOD CULTURE X 2,   STAT     12/21/18 1049   12/21/18 1048  Urine culture  ONCE - STAT,   STAT     12/21/18 1049          Vitals/Pain Today's Vitals   12/21/18 1700 12/21/18 1730 12/21/18 1800 12/21/18 1830  BP: (!) 149/99 (!) 136/94 (!) 143/93 (!) 141/98  Pulse: 95 88 88 91  Resp: (!) 23 (!) 21 (!) 23 19  Temp:      TempSrc:      SpO2:  100% 100% 100% 98%  Weight:      Height:      PainSc:        Isolation Precautions Airborne and Contact precautions  Medications Medications  cefTRIAXone (ROCEPHIN) 1 g in sodium chloride 0.9 % 100 mL IVPB (0 g Intravenous Stopped 12/21/18 1220)  enoxaparin (LOVENOX) injection 40 mg (40 mg Subcutaneous Given 12/21/18 1839)  lactated ringers infusion ( Intravenous New Bag/Given 12/21/18 1845)  guaiFENesin-dextromethorphan (ROBITUSSIN DM) 100-10 MG/5ML syrup 10 mL (has no administration in time range)  chlorpheniramine-HYDROcodone (TUSSIONEX) 10-8 MG/5ML suspension 5 mL (has no administration in time range)  vitamin C (ASCORBIC ACID) tablet 500 mg (500 mg Oral Given 12/21/18 1837)  zinc sulfate capsule 220 mg (220 mg Oral Given 12/21/18 1838)  ondansetron (ZOFRAN) tablet 4 mg (has no administration in time range)    Or  ondansetron (ZOFRAN) injection 4 mg (has no administration in time range)  docusate sodium (COLACE) capsule 100 mg (has no administration in time range)  lactated ringers bolus 2,700 mL (0 mLs Intravenous Stopped 12/21/18 1800)  metroNIDAZOLE (FLAGYL) IVPB 500 mg (0 mg Intravenous Stopped 12/21/18 1458)  acetaminophen (TYLENOL) tablet 1,000 mg (1,000 mg Oral Given 12/21/18 1154)  morphine 4 MG/ML injection 4 mg  (4 mg Intravenous Given 12/21/18 1155)  vancomycin (VANCOCIN) 2,000 mg in sodium chloride 0.9 % 500 mL IVPB (0 mg Intravenous Stopped 12/21/18 1700)    Mobility walks with person assist Low fall risk   Focused Assessments Pulmonary Assessment Handoff:  Lung sounds:   O2 Device: Room Air        R Recommendations: See Admitting Provider Note  Report given to: Langley Gauss RN by Mortimer Fries RN  Additional Notes:

## 2018-12-21 NOTE — ED Notes (Signed)
CRITICAL VALUE STICKER  CRITICAL VALUE: Lactic -2.5  RECEIVER (on-site recipient of call): Epifanio Lesches, RN  DATE & TIME NOTIFIED: 12/21/2018 1145  MESSENGER (representative from lab):   MD NOTIFIED: Estelle Grumbles, MD  TIME OF NOTIFICATION: [id number]  RESPONSE:

## 2018-12-21 NOTE — ED Provider Notes (Signed)
[place] DEPT Provider Note   CSN: [id number] Arrival date & time: 12/21/18  1002     History   Chief Complaint Chief Complaint  Patient presents with  . Dysuria    HPI Steven Bradley is a 73 y.o. male.     HPI  73 year old male with a history of hypertension, hyperlipidemia, enlarged prostate, PTSD, presents with concern for polyarthralgias and fever.  Per EMS, patient had described urinary symptoms, however he denies any urinary symptoms on my history, including no dysuria, frequency or other difficulty urinating.  He reports polyarthralgias beginning approximately 3 days ago, with pain in his bilateral MTPs, bilateral knees, bilateral elbows, and right MCP.  Reports he has no prior history of polyarthralgias.  Also reports sensation of fatigue, nausea, mild cough, mild dyspnea, mild abdominal pain  Denies vomiting or diarrhea.  Denies sore throat, rash, congestion, loss of taste or smell, sick contacts, tick bites.  Denies new medications that began he started prior to the symptoms, but does report his primary care physician did start him on something for the polyarthralgias a few days ago.  No known COVID contacts  Past Medical History:  Diagnosis Date  . Arthritis   . Enlarged prostate   . GERD (gastroesophageal reflux disease)   . Gout    Patient doesn't recall having gout but was prescribed allopurinol for ankle swelling.  Marland Kitchen Hyperlipidemia   . Hypertension   . Prostate cancer [facility]) June 2015  . PTSD (post-traumatic stress disorder)   . Syncope    Negative outpatient workup.  . Vitamin D deficiency     Patient Active Problem List   Diagnosis Date Noted  . Sepsis ([place]) 12/21/2018  . Macrocytosis 12/21/2018  . COVID-19 virus infection 12/21/2018  . Severe sepsis ([place]) 06/04/2016  . Bacteremia due to Gram-positive bacteria 06/04/2016  . Acute pyelonephritis 06/02/2016  . Hyperlipemia 06/01/2016  . GERD (gastroesophageal reflux  disease) 06/01/2016  . Nausea and vomiting 06/01/2016  . Nephrotic syndrome 12/29/2013  . Purpura ([place]) 12/29/2013  . Hypokalemia 12/29/2013  . Hyponatremia 12/29/2013  . Thrombocytopenia ([place]) 12/29/2013  . Essential hypertension 12/29/2013  . Elevated LFTs 12/29/2013    Past Surgical History:  Procedure Laterality Date  . PROSTATE BIOPSY  June 2015  . PROSTATE SURGERY    . Right hand surgery    . Scrapnel removal from scalp     During [place] war        Home Medications    Prior to Admission medications   Medication Sig Start Date End Date Taking? Authorizing Provider  amLODipine (NORVASC) 10 MG tablet Take 10 mg by mouth daily as needed (for BP >150).    Yes [provider]  atorvastatin (LIPITOR) 80 MG tablet Take 40 mg by mouth at bedtime.   Yes [provider]  furosemide (LASIX) 40 MG tablet Take 40 mg by mouth daily.   Yes [provider]  gabapentin (NEURONTIN) 100 MG capsule Take 100 mg by mouth 2 (two) times daily.   Yes [provider]  HYDROcodone-acetaminophen (NORCO) 10-325 MG per tablet Take 1 tablet by mouth every 12 (twelve) hours as needed for moderate pain.    Yes [provider]  ibuprofen (ADVIL) 200 MG tablet Take 200 mg by mouth every 6 (six) hours as needed for fever or moderate pain.   Yes [provider]  magnesium oxide (MAGNESIUM-OXIDE) 400 (241.3 Mg) MG tablet Take 400 mg by mouth daily.   Yes [provider]  Multiple Vitamin (MULTIVITAMIN WITH MINERALS) TABS tablet Take 1 tablet by mouth daily.   Yes [provider]  omeprazole (PRILOSEC) 20 MG capsule Take 20 mg by mouth as needed (indigestion).   Yes [provider]  predniSONE (DELTASONE) 5 MG tablet Take 5 mg by mouth daily as needed (gout).   Yes [provider]  sertraline (ZOLOFT) 100 MG tablet Take 100 mg by mouth daily.   Yes [provider]  Tetrahydroz-Dextran-PEG-Povid (VISINE ADVANCED  RELIEF) 0.05-0.1-1-1 % SOLN Place 1 drop into both eyes daily as needed (for irritated eyes).    Yes [provider]  traZODone (DESYREL) 100 MG tablet Take 100 mg by mouth at bedtime.   Yes [provider]  acetaminophen (TYLENOL) 325 MG tablet Take 2 tablets (650 mg total) by mouth every 6 (six) hours as needed for mild pain (or Fever >/= 101). Patient not taking: Reported on 06/15/2016 06/04/16   Debbe Odea, MD  cefTRIAXone 2 g in dextrose 5 % 50 mL Inject 2 g into the vein daily. Patient not taking: Reported on 06/15/2016 06/05/16   Debbe Odea, MD    Family History History reviewed. No pertinent family history.  Social History Social History   Tobacco Use  . Smoking status: Never Smoker  . Smokeless tobacco: Never Used  Substance Use Topics  . Alcohol use: Yes    Comment: occasionaly  . Drug use: No     Allergies   Allopurinol and Vitamin d analogs   Review of Systems Review of Systems  Constitutional: Positive for activity change, chills, fatigue and fever.  HENT: Negative for sore throat.   Eyes: Negative for visual disturbance.  Respiratory: Positive for cough and shortness of breath.   Cardiovascular: Negative for chest pain.  Gastrointestinal: Positive for abdominal pain and nausea. Negative for constipation, diarrhea and vomiting.  Genitourinary: Negative for difficulty urinating.  Musculoskeletal: Positive for arthralgias. Negative for back pain and neck stiffness.  Skin: Negative for rash.  Neurological: Negative for syncope and headaches.     Physical Exam Updated Vital Signs BP (!) 149/88   Pulse 93   Temp 98.3 F (36.8 C) (Oral)   Resp 17   Ht 6\' 6"  (1.981 m)   Wt 102.5 kg   SpO2 100%   BMI 26.12 kg/m   Physical Exam Vitals signs and nursing note reviewed.  Constitutional:      General: He is not in acute distress.    Appearance: He is well-developed. He is not diaphoretic.  HENT:     Head: Normocephalic and atraumatic.   Eyes:     Conjunctiva/sclera: Conjunctivae normal.  Neck:     Musculoskeletal: Normal range of motion.  Cardiovascular:     Rate and Rhythm: Regular rhythm. Tachycardia present.     Heart sounds: Normal heart sounds. No murmur. No friction rub. No gallop.   Pulmonary:     Effort: Pulmonary effort is normal. No respiratory distress.     Breath sounds: Normal breath sounds. No wheezing or rales.  Abdominal:     General: There is no distension.     Palpations: Abdomen is soft.     Tenderness: There is no abdominal tenderness. There is no guarding.  Skin:    General: Skin is warm and dry.  Neurological:     Mental Status: He is alert and oriented to person, place, and time.      ED Treatments / Results  Labs (all labs ordered are listed,  but only abnormal results are displayed) Labs Reviewed  SARS CORONAVIRUS 2 (HOSPITAL ORDER, [place] [facility]) - Abnormal; Notable for the following components:      Result Value   SARS Coronavirus 2 POSITIVE (*)    All other components within normal limits  LACTIC ACID, PLASMA - Abnormal; Notable for the following components:   Lactic Acid, Venous 2.5 (*)    All other components within normal limits  COMPREHENSIVE METABOLIC PANEL - Abnormal; Notable for the following components:   Sodium 133 (*)    Chloride 94 (*)    Glucose, Bld 136 (*)    Total Protein 9.0 (*)    AST 63 (*)    Alkaline Phosphatase 132 (*)    Total Bilirubin 4.0 (*)    Anion gap 16 (*)    All other components within normal limits  CBC WITH DIFFERENTIAL/PLATELET - Abnormal; Notable for the following components:   WBC 15.6 (*)    RBC 3.61 (*)    HCT 38.5 (*)    MCV 106.6 (*)    MCH 36.3 (*)    Neutro Abs 13.4 (*)    Abs Immature Granulocytes 0.16 (*)    All other components within normal limits  URINALYSIS, ROUTINE W REFLEX MICROSCOPIC - Abnormal; Notable for the following components:   Color, Urine AMBER (*)    APPearance HAZY (*)    Bilirubin  Urine SMALL (*)    Ketones, ur 5 (*)    Protein, ur 100 (*)    Bacteria, UA RARE (*)    All other components within normal limits  C-REACTIVE PROTEIN - Abnormal; Notable for the following components:   CRP 11.3 (*)    All other components within normal limits  D-DIMER, QUANTITATIVE (NOT AT [facility]) - Abnormal; Notable for the following components:   D-Dimer, Quant 3.30 (*)    All other components within normal limits  FERRITIN - Abnormal; Notable for the following components:   Ferritin 740 (*)    All other components within normal limits  FIBRINOGEN - Abnormal; Notable for the following components:   Fibrinogen 794 (*)    All other components within normal limits  SEDIMENTATION RATE - Abnormal; Notable for the following components:   Sed Rate 115 (*)    All other components within normal limits  CULTURE, BLOOD (ROUTINE X 2)  CULTURE, BLOOD (ROUTINE X 2)  URINE CULTURE  LACTIC ACID, PLASMA  PROTIME-INR  APTT  LACTATE DEHYDROGENASE  PROCALCITONIN  CBC WITH DIFFERENTIAL/PLATELET  COMPREHENSIVE METABOLIC PANEL  C-REACTIVE PROTEIN  D-DIMER, QUANTITATIVE (NOT AT [facility])  FERRITIN  MAGNESIUM    EKG EKG Interpretation  Date/Time:  Thursday December 21 2018 10:38:57 EDT Ventricular Rate:  125 PR Interval:    QRS Duration: 68 QT Interval:  361 QTC Calculation: 521 R Axis:   80 Text Interpretation:  Sinus tachycardia Borderline T abnormalities, diffuse leads Prolonged QT interval No significant change since last tracing Confirmed by Gareth Morgan [id number]) on 12/21/2018 11:37:38 AM   Radiology Dg Chest Port 1 View  Result Date: 12/21/2018 CLINICAL DATA:  Shortness of breath. EXAM: PORTABLE CHEST 1 VIEW COMPARISON:  Radiograph June 01, 2016. FINDINGS: The heart size and mediastinal contours are within normal limits. Both lungs are clear. No pneumothorax or pleural effusion is noted. The visualized skeletal structures are unremarkable. IMPRESSION: No active disease. Electronically  Signed   By: Marijo Conception M.D.   On: 12/21/2018 12:49    Procedures .Critical Care Performed by:  Gareth Morgan, MD Authorized by: Gareth Morgan, MD   Critical care provider statement:    Critical care time (minutes):  45   Critical care was necessary to treat or prevent imminent or life-threatening deterioration of the following conditions:  Sepsis   Critical care was time spent personally by me on the following activities:  Evaluation of patient's response to treatment, examination of patient, ordering and performing treatments and interventions, ordering and review of laboratory studies, ordering and review of radiographic studies, pulse oximetry, re-evaluation of patient's condition, obtaining history from patient or surrogate and review of old charts   (including critical care time)  Medications Ordered in ED Medications  cefTRIAXone (ROCEPHIN) 1 g in sodium chloride 0.9 % 100 mL IVPB (0 g Intravenous Stopped 12/21/18 1220)  enoxaparin (LOVENOX) injection 40 mg (40 mg Subcutaneous Given 12/21/18 1839)  lactated ringers infusion ( Intravenous New Bag/Given 12/21/18 1845)  guaiFENesin-dextromethorphan (ROBITUSSIN DM) 100-10 MG/5ML syrup 10 mL (has no administration in time range)  chlorpheniramine-HYDROcodone (TUSSIONEX) 10-8 MG/5ML suspension 5 mL (has no administration in time range)  vitamin C (ASCORBIC ACID) tablet 500 mg (500 mg Oral Given 12/21/18 1837)  zinc sulfate capsule 220 mg (220 mg Oral Given 12/21/18 1838)  ondansetron (ZOFRAN) tablet 4 mg (has no administration in time range)    Or  ondansetron (ZOFRAN) injection 4 mg (has no administration in time range)  docusate sodium (COLACE) capsule 100 mg (has no administration in time range)  lactated ringers bolus 2,700 mL (0 mLs Intravenous Stopped 12/21/18 1800)  metroNIDAZOLE (FLAGYL) IVPB 500 mg (0 mg Intravenous Stopped 12/21/18 1458)  acetaminophen (TYLENOL) tablet 1,000 mg (1,000 mg Oral Given 12/21/18 1154)  morphine  4 MG/ML injection 4 mg (4 mg Intravenous Given 12/21/18 1155)  vancomycin (VANCOCIN) 2,000 mg in sodium chloride 0.9 % 500 mL IVPB (0 mg Intravenous Stopped 12/21/18 1700)     Initial Impression / Assessment and Plan / ED Course  I have reviewed the triage vital signs and the nursing notes.  Pertinent labs & imaging results that were available during my care of the patient were reviewed by me and considered in my medical decision making (see chart for details).        73 year old male with a history of hypertension, hyperlipidemia, enlarged prostate, PTSD, presents with concern for polyarthralgias and fever.  Initial report of dysuria and rocephin ordered empirically but pt denies on further history and abx broadened for sepsis secondary to unknown cause. COVID testing pending, consider covid given constellation of symptoms but ddx also includes other bacteremia or rheumatologic etioloties. UA and COVID pending.   Labs with elevated bili, no RUQ tenderness and doubt cholangitis/choleclithiasis.   Given IV fluids, abx for sepsis with tachycardia, leukocytosis.  Admitted for further care.   Final Clinical Impressions(s) / ED Diagnoses   Final diagnoses:  COVID-19  Sepsis, due to unspecified organism, unspecified whether acute organ dysfunction present [facility])    ED Discharge Orders    None       Gareth Morgan, MD 12/21/18 2204

## 2018-12-21 NOTE — ED Notes (Signed)
Urinal @ bedside. Pt has been informed that MD needs UA sample.

## 2018-12-21 NOTE — ED Triage Notes (Signed)
Per EMS, pt from home, hx of gout pain, increased urination/dysuria. Usually ambulatory, but needed to be carried down stairs by EMS today. Pt said urination symptoms started a few days ago and pain started last night.

## 2018-12-21 NOTE — ED Notes (Signed)
ED TO INPATIENT HANDOFF REPORT  Name/Age/Gender Steven Bradley 73 y.o. male  Code Status    Code Status Orders  (From admission, onward)         Start     Ordered   12/21/18 1725  Full code  Continuous     12/21/18 1734        Code Status History    Date Active Date Inactive Code Status Order ID Comments User Context   06/01/2016 2228 06/04/2016 1841 Full Code [id number]  Albertine Patricia, MD Inpatient   12/29/2013 1609 12/31/2013 1338 Full Code [id number]  Rexene Alberts, MD ED   Advance Care Planning Activity    Advance Directive Documentation     Most Recent Value  Type of Advance Directive  Living will  Pre-existing out of facility DNR order (yellow form or pink MOST form)  -  "MOST" Form in Place?  -      Home/SNF/Other Home  Chief Complaint gout dysuria  Level of Care/Admitting Diagnosis ED Disposition    ED Disposition Condition [place]: [facility] [[id number]]  Level of Care: Telemetry [5]  Covid Evaluation: Confirmed COVID Positive  [facility]: [id number]  Admitting Physician: Lavina Hamman [id number]  Attending Physician: Lavina Hamman [id number]  Estimated length of stay: past midnight tomorrow  Certification:: I certify this patient will need inpatient services for at least 2 midnights  PT Class (Do Not Modify): Inpatient [101]  PT Acc Code (Do Not Modify): Private [1]       Medical History Past Medical History:  Diagnosis Date  . Arthritis   . Enlarged prostate   . GERD (gastroesophageal reflux disease)   . Gout    Patient doesn't recall having gout but was prescribed allopurinol for ankle swelling.  Marland Kitchen Hyperlipidemia   . Hypertension   . Prostate cancer [facility]) June 2015  . PTSD (post-traumatic stress disorder)   . Syncope    Negative outpatient workup.  . Vitamin D deficiency     Allergies Allergies  Allergen Reactions  . Allopurinol Rash  . Vitamin D Analogs Rash    IV  Location/Drains/Wounds Patient Lines/Drains/Airways Status   Active Line/Drains/Airways    Name:   Placement date:   Placement time:   Site:   Days:   Peripheral IV 12/21/18 Left Forearm   12/21/18    1037    Forearm   less than 1          Labs/Imaging Results for orders placed or performed during the hospital encounter of 12/21/18 (from the past 48 hour(s))  Lactic acid, plasma     Status: Abnormal   Collection Time: 12/21/18 10:48 AM  Result Value Ref Range   Lactic Acid, Venous 2.5 (HH) 0.5 - 1.9 mmol/L    Comment: CRITICAL RESULT CALLED TO, READ BACK BY AND VERIFIED WITH: DOWD, P 1140 12/21/18 JM Performed at [facility], [place] [address] [place], [place] [address]   SARS Coronavirus 2 by RT PCR (hospital order, performed in [facility] hospital lab) Nasopharyngeal Nasopharyngeal Swab     Status: Abnormal   Collection Time: 12/21/18 10:50 AM   Specimen: Nasopharyngeal Swab  Result Value Ref Range   SARS Coronavirus 2 POSITIVE (A) NEGATIVE    Comment: RESULT CALLED TO, READ BACK BY AND VERIFIED WITH: N.MOSCINSKI AT [id number] ON 12/21/18 BY N.THOMPSON (NOTE) If result is NEGATIVE SARS-CoV-2 target nucleic acids are NOT DETECTED. The SARS-CoV-2 RNA  is generally detectable in upper and lower  respiratory specimens during the acute phase of infection. The lowest  concentration of SARS-CoV-2 viral copies this assay can detect is 250  copies / mL. A negative result does not preclude SARS-CoV-2 infection  and should not be used as the sole basis for treatment or other  patient management decisions.  A negative result may occur with  improper specimen collection / handling, submission of specimen other  than nasopharyngeal swab, presence of viral mutation(s) within the  areas targeted by this assay, and inadequate number of viral copies  (<250 copies / mL). A negative result must be combined with clinical  observations, patient history, and epidemiological  information. If result is POSITIVE SARS-CoV-2 target nucleic acids are DET ECTED. The SARS-CoV-2 RNA is generally detectable in upper and lower  respiratory specimens during the acute phase of infection.  Positive  results are indicative of active infection with SARS-CoV-2.  Clinical  correlation with patient history and other diagnostic information is  necessary to determine patient infection status.  Positive results do  not rule out bacterial infection or co-infection with other viruses. If result is PRESUMPTIVE POSTIVE SARS-CoV-2 nucleic acids MAY BE PRESENT.   A presumptive positive result was obtained on the submitted specimen  and confirmed on repeat testing.  While 2019 novel coronavirus  (SARS-CoV-2) nucleic acids may be present in the submitted sample  additional confirmatory testing may be necessary for epidemiological  and / or clinical management purposes  to differentiate between  SARS-CoV-2 and other Sarbecovirus currently known to infect humans.  If clinically indicated additional testing with an alternate test  methodology ([place]) is advised. The SARS-CoV-2 RNA is generally  detectable in upper and lower respiratory specimens during the acute  phase of infection. The expected result is Negative. Fact Sheet for Patients:  StrictlyIdeas.no Fact Sheet for Healthcare Providers: BankingDealers.co.za This test is not yet approved or cleared by the [place] FDA and has been authorized for detection and/or diagnosis of SARS-CoV-2 by FDA under an Emergency Use Authorization (EUA).  This EUA will remain in effect (meaning this test can be used) for the duration of the COVID-19 declaration under Section 564(b)(1) of the Act, 21 U.S.C. section 360bbb-3(b)(1), unless the authorization is terminated or revoked sooner. Performed at [facility], [place] [address], [place], [place] [address]   Comprehensive  metabolic panel     Status: Abnormal   Collection Time: 12/21/18 10:51 AM  Result Value Ref Range   Sodium 133 (L) 135 - 145 mmol/L   Potassium 3.6 3.5 - 5.1 mmol/L   Chloride 94 (L) 98 - 111 mmol/L   CO2 23 22 - 32 mmol/L   Glucose, Bld 136 (H) 70 - 99 mg/dL   BUN 13 8 - 23 mg/dL   Creatinine, Ser 0.91 0.61 - 1.24 mg/dL   Calcium 9.0 8.9 - 10.3 mg/dL   Total Protein 9.0 (H) 6.5 - 8.1 g/dL   Albumin 3.6 3.5 - 5.0 g/dL   AST 63 (H) 15 - 41 U/L   ALT 31 0 - 44 U/L   Alkaline Phosphatase 132 (H) 38 - 126 U/L   Total Bilirubin 4.0 (H) 0.3 - 1.2 mg/dL   GFR calc non Af Amer >60 >60 mL/min   GFR calc Af Amer >60 >60 mL/min   Anion gap 16 (H) 5 - 15    Comment: Performed at [facility], [place] [address] [place], [place] [address]  CBC  WITH DIFFERENTIAL     Status: Abnormal   Collection Time: 12/21/18 10:51 AM  Result Value Ref Range   WBC 15.6 (H) 4.0 - 10.5 K/uL   RBC 3.61 (L) 4.22 - 5.81 MIL/uL   Hemoglobin 13.1 13.0 - 17.0 g/dL   HCT 38.5 (L) 39.0 - 52.0 %   MCV 106.6 (H) 80.0 - 100.0 fL   MCH 36.3 (H) 26.0 - 34.0 pg   MCHC 34.0 30.0 - 36.0 g/dL   RDW 13.2 11.5 - 15.5 %   Platelets 182 150 - 400 K/uL   nRBC 0.0 0.0 - 0.2 %   Neutrophils Relative % 86 %   Neutro Abs 13.4 (H) 1.7 - 7.7 K/uL   Lymphocytes Relative 6 %   Lymphs Abs 0.9 0.7 - 4.0 K/uL   Monocytes Relative 7 %   Monocytes Absolute 1.0 0.1 - 1.0 K/uL   Eosinophils Relative 0 %   Eosinophils Absolute 0.0 0.0 - 0.5 K/uL   Basophils Relative 0 %   Basophils Absolute 0.0 0.0 - 0.1 K/uL   Immature Granulocytes 1 %   Abs Immature Granulocytes 0.16 (H) 0.00 - 0.07 K/uL    Comment: Performed at [facility], [place] [address] [place], [place] [address]  Protime-INR     Status: None   Collection Time: 12/21/18 12:09 PM  Result Value Ref Range   Prothrombin Time 15.2 11.4 - 15.2 seconds   INR 1.2 0.8 - 1.2    Comment: (NOTE) INR goal varies based on device and disease  states. Performed at [facility], [place] [address] [place], [place] [address]   APTT     Status: None   Collection Time: 12/21/18 12:09 PM  Result Value Ref Range   aPTT 34 24 - 36 seconds    Comment: Performed at [facility], [place] [address] [place], [place] [address]  Urinalysis, Routine w reflex microscopic     Status: Abnormal   Collection Time: 12/21/18  3:11 PM  Result Value Ref Range   Color, Urine AMBER (A) YELLOW    Comment: BIOCHEMICALS MAY BE AFFECTED BY COLOR   APPearance HAZY (A) CLEAR   Specific Gravity, Urine 1.026 1.005 - 1.030   pH 6.0 5.0 - 8.0   Glucose, UA NEGATIVE NEGATIVE mg/dL   Hgb urine dipstick NEGATIVE NEGATIVE   Bilirubin Urine SMALL (A) NEGATIVE   Ketones, ur 5 (A) NEGATIVE mg/dL   Protein, ur 100 (A) NEGATIVE mg/dL   Nitrite NEGATIVE NEGATIVE   Leukocytes,Ua NEGATIVE NEGATIVE   RBC / HPF 0-5 0 - 5 RBC/hpf   WBC, UA 6-10 0 - 5 WBC/hpf   Bacteria, UA RARE (A) NONE SEEN   Squamous Epithelial / LPF 6-10 0 - 5   Mucus PRESENT     Comment: Performed at [facility], [place] [address], [place], [place] [address]  Lactic acid, plasma     Status: None   Collection Time: 12/21/18  4:08 PM  Result Value Ref Range   Lactic Acid, Venous 1.7 0.5 - 1.9 mmol/L    Comment: Performed at [facility], [place] [address], [place], [place] [address]  C-reactive protein     Status: Abnormal   Collection Time: 12/21/18  6:29 PM  Result Value Ref Range   CRP 11.3 (H) <1.0 mg/dL    Comment: Performed at [facility], [place] [address], [place], [place] [address]  Ferritin     Status: Abnormal  Collection Time: 12/21/18  6:29 PM  Result Value Ref Range   Ferritin 740 (H) 24 - 336 ng/mL    Comment: Performed at [facility], [place] [address], [place], [place] [address]  D-dimer, quantitative (not at [facility])     Status: Abnormal   Collection Time: 12/21/18  6:58 PM   Result Value Ref Range   D-Dimer, Quant 3.30 (H) 0.00 - 0.50 ug/mL-FEU    Comment: (NOTE) At the manufacturer cut-off of 0.50 ug/mL FEU, this assay has been documented to exclude PE with a sensitivity and negative predictive value of 97 to 99%.  At this time, this assay has not been approved by the FDA to exclude DVT/VTE. Results should be correlated with clinical presentation. Performed at [facility], [place] [address], [place], [place] [address]   Fibrinogen     Status: Abnormal   Collection Time: 12/21/18  6:58 PM  Result Value Ref Range   Fibrinogen 794 (H) 210 - 475 mg/dL    Comment: Performed at [facility], [place] [address] [place], [place] [address]  Lactate dehydrogenase     Status: None   Collection Time: 12/21/18  6:58 PM  Result Value Ref Range   LDH 101 98 - 192 U/L    Comment: Performed at [facility], [place] [address], [place], [place] [address]  Procalcitonin     Status: None   Collection Time: 12/21/18  6:58 PM  Result Value Ref Range   Procalcitonin 0.54 ng/mL    Comment:        Interpretation: PCT > 0.5 ng/mL and <= 2 ng/mL: Systemic infection (sepsis) is possible, but other conditions are known to elevate PCT as well. (NOTE)       Sepsis PCT Algorithm           Lower Respiratory Tract                                      Infection PCT Algorithm    ----------------------------     ----------------------------         PCT < 0.25 ng/mL                PCT < 0.10 ng/mL         Strongly encourage             Strongly discourage   discontinuation of antibiotics    initiation of antibiotics    ----------------------------     -----------------------------       PCT 0.25 - 0.50 ng/mL            PCT 0.10 - 0.25 ng/mL               OR       >80% decrease in PCT            Discourage initiation of                                            antibiotics      Encourage discontinuation           of antibiotics     ----------------------------     -----------------------------         PCT >= 0.50 ng/mL  PCT 0.26 - 0.50 ng/mL                AND       <80% decrease in PCT             Encourage initiation of                                             antibiotics       Encourage continuation           of antibiotics    ----------------------------     -----------------------------        PCT >= 0.50 ng/mL                  PCT > 0.50 ng/mL               AND         increase in PCT                  Strongly encourage                                      initiation of antibiotics    Strongly encourage escalation           of antibiotics                                     -----------------------------                                           PCT <= 0.25 ng/mL                                                 OR                                        > 80% decrease in PCT                                     Discontinue / Do not initiate                                             antibiotics Performed at [place] [address] [place], [place] [address]   Sedimentation rate     Status: Abnormal   Collection Time: 12/21/18  6:58 PM  Result Value Ref Range   Sed Rate 115 (H) 0 - 16 mm/hr    Comment: Performed at [facility], [place] [address] [place], [place] [address]   Dg Chest Port 1 View  Result Date: 12/21/2018 CLINICAL DATA:  Shortness of breath. EXAM: PORTABLE CHEST  1 VIEW COMPARISON:  Radiograph June 01, 2016. FINDINGS: The heart size and mediastinal contours are within normal limits. Both lungs are clear. No pneumothorax or pleural effusion is noted. The visualized skeletal structures are unremarkable. IMPRESSION: No active disease. Electronically Signed   By: Marijo Conception M.D.   On: 12/21/2018 12:49    Pending Labs Unresulted Labs (From admission, onward)    Start     Ordered   12/22/18 0500  CBC with Differential/Platelet   Daily,   R     12/21/18 1734   12/22/18 0500  Comprehensive metabolic panel  Daily,   R     12/21/18 1734   12/22/18 0500  C-reactive protein  Daily,   R     12/21/18 1734   12/22/18 0500  D-dimer, quantitative (not at [facility])  Daily,   R     12/21/18 1734   12/22/18 0500  Ferritin  Daily,   R     12/21/18 1734   12/22/18 0500  Magnesium  Daily,   R     12/21/18 1734   12/21/18 1048  Blood Culture (routine x 2)  BLOOD CULTURE X 2,   STAT     12/21/18 1049   12/21/18 1048  Urine culture  ONCE - STAT,   STAT     12/21/18 1049          Vitals/Pain Today's Vitals   12/21/18 1700 12/21/18 1730 12/21/18 1800 12/21/18 1830  BP: (!) 149/99 (!) 136/94 (!) 143/93 (!) 141/98  Pulse: 95 88 88 91  Resp: (!) 23 (!) 21 (!) 23 19  Temp:      TempSrc:      SpO2: 100% 100% 100% 98%  Weight:      Height:      PainSc:        Isolation Precautions Airborne and Contact precautions  Medications Medications  cefTRIAXone (ROCEPHIN) 1 g in sodium chloride 0.9 % 100 mL IVPB (0 g Intravenous Stopped 12/21/18 1220)  enoxaparin (LOVENOX) injection 40 mg (40 mg Subcutaneous Given 12/21/18 1839)  lactated ringers infusion ( Intravenous New Bag/Given 12/21/18 1845)  guaiFENesin-dextromethorphan (ROBITUSSIN DM) 100-10 MG/5ML syrup 10 mL (has no administration in time range)  chlorpheniramine-HYDROcodone (TUSSIONEX) 10-8 MG/5ML suspension 5 mL (has no administration in time range)  vitamin C (ASCORBIC ACID) tablet 500 mg (500 mg Oral Given 12/21/18 1837)  zinc sulfate capsule 220 mg (220 mg Oral Given 12/21/18 1838)  ondansetron (ZOFRAN) tablet 4 mg (has no administration in time range)    Or  ondansetron (ZOFRAN) injection 4 mg (has no administration in time range)  docusate sodium (COLACE) capsule 100 mg (has no administration in time range)  lactated ringers bolus 2,700 mL (0 mLs Intravenous Stopped 12/21/18 1800)  metroNIDAZOLE (FLAGYL) IVPB 500 mg (0 mg Intravenous Stopped 12/21/18 1458)  acetaminophen  (TYLENOL) tablet 1,000 mg (1,000 mg Oral Given 12/21/18 1154)  morphine 4 MG/ML injection 4 mg (4 mg Intravenous Given 12/21/18 1155)  vancomycin (VANCOCIN) 2,000 mg in sodium chloride 0.9 % 500 mL IVPB (0 mg Intravenous Stopped 12/21/18 1700)    Mobility walks

## 2018-12-21 NOTE — ED Notes (Signed)
Report given to [facility]. 

## 2018-12-22 DIAGNOSIS — I10 Essential (primary) hypertension: Secondary | ICD-10-CM

## 2018-12-22 DIAGNOSIS — M1049 Other secondary gout, multiple sites: Secondary | ICD-10-CM

## 2018-12-22 DIAGNOSIS — R7989 Other specified abnormal findings of blood chemistry: Secondary | ICD-10-CM

## 2018-12-22 DIAGNOSIS — M109 Gout, unspecified: Secondary | ICD-10-CM

## 2018-12-22 DIAGNOSIS — U071 COVID-19: Secondary | ICD-10-CM

## 2018-12-22 LAB — URINE CULTURE: Culture: NO GROWTH

## 2018-12-22 MED ORDER — PREDNISONE 20 MG PO TABS
40.0000 mg | ORAL_TABLET | Freq: Every day | ORAL | Status: DC
  Administered 2018-12-22: 40 mg via ORAL
  Filled 2018-12-22: qty 2

## 2018-12-22 MED ORDER — IBUPROFEN 600 MG PO TABS
600.0000 mg | ORAL_TABLET | ORAL | Status: DC | PRN
  Filled 2018-12-22: qty 1

## 2018-12-22 MED ORDER — SODIUM CHLORIDE 0.9 % IV SOLN
INTRAVENOUS | Status: AC
  Administered 2018-12-22 (×2): via INTRAVENOUS

## 2018-12-22 MED ORDER — PANTOPRAZOLE SODIUM 40 MG PO TBEC
40.0000 mg | DELAYED_RELEASE_TABLET | Freq: Two times a day (BID) | ORAL | Status: DC
  Administered 2018-12-22 – 2018-12-24 (×5): 40 mg via ORAL
  Filled 2018-12-22 (×6): qty 1

## 2018-12-22 MED ORDER — IBUPROFEN 600 MG PO TABS
600.0000 mg | ORAL_TABLET | Freq: Four times a day (QID) | ORAL | Status: DC
  Administered 2018-12-22 – 2018-12-23 (×8): 600 mg via ORAL
  Filled 2018-12-22 (×10): qty 1

## 2018-12-22 NOTE — Progress Notes (Signed)
A/O no noted respiratory distress. RA 96-100%. Uses the incentive as a a pro. Right side continuous pain, he is experiencing joint pain. He uses a cane when at home.   His BP is slightly increase r/t pain. Spouse noted he does not take HTN meds.

## 2018-12-22 NOTE — Progress Notes (Signed)
Physical Therapy Evaluation Patient Details Name: Steven Bradley MRN: [id number] DOB: 02/24/1946 Today's Date: 12/22/2018   History of Present Illness  73 y/o male w/ hx of HTN, HLD, enlarged prostate, PTSD, GERD, gout, syncope presented w/ polyarthralgias and fever.  Clinical Impression   Pt admitted with above diagnosis. Pt currently with functional limitations due to the deficits listed below (see PT Problem List). He is in great pain and is very apprehensive about any movement with BLE. Currently he is needing max to total a with mobility sec to pain and perception of pain. Pt will benefit from skilled PT to decrease his pain and increase his ROM, strength, activity tolerance, independence and safety with mobility to allow discharge to the venue listed below.       Follow Up Recommendations SNF(pending on progression while in hospital)    Equipment Recommendations       Recommendations for Other Services OT consult     Precautions / Restrictions Precautions Precautions: Fall;Other (comment)(pain in BLE) Precaution Comments: has greay diffiuclty moving BLE and is in great pain with others moving his legs Restrictions Weight Bearing Restrictions: No Other Position/Activity Restrictions: but is unable to WB on BLE today      Mobility  Bed Mobility Overal bed mobility: Needs Assistance Bed Mobility: Rolling;Supine to Sit Rolling: Min assist   Supine to sit: Max assist     General bed mobility comments: minimallly allows for movement in bed.  Transfers Overall transfer level: Needs assistance Equipment used: None             General transfer comment: unable to complete transfers today as pt is unable to tolerate motion sec to pain in BLE. able to long sit in bed briefly but unable to get BLE to EOB and sit unsupported  Ambulation/Gait             General Gait Details: unable to tolerate WB on BLE this pm hence unable to ambulate  Stairs             Wheelchair Mobility    Modified Rankin (Stroke Patients Only)       Balance Overall balance assessment: Needs assistance Sitting-balance support: Bilateral upper extremity supported Sitting balance-Leahy Scale: Fair   Postural control: Posterior lean     Standing balance comment: unable to tolerate                             Pertinent Vitals/Pain Pain Assessment: 0-10 Pain Score: 8  Pain Location: RLE 8/10 LLE 6-7/10 Pain Descriptors / Indicators: Discomfort;Grimacing;Guarding Pain Intervention(s): Limited activity within patient's tolerance    Home Living Family/patient expects to be discharged to:: Private residence Living Arrangements: Spouse/significant other Available Help at Discharge: Family Type of Home: House Home Access: Ramped entrance     Home Layout: [place]: [place] - single point;Shower seat - built in Additional Comments: states that has rails all around porch, front ramp and indoor stairs    Prior Function Level of Independence: Independent with assistive device(s)               Hand Dominance        Extremity/Trunk Assessment   Upper Extremity Assessment Upper Extremity Assessment: Overall WFL for tasks assessed    Lower Extremity Assessment Lower Extremity Assessment: RLE deficits/detail;LLE deficits/detail RLE Deficits / Details: minimally able to move legs on his own, does not tolerate therapist attempt to move legs and heavily guards  against movement LLE Deficits / Details: same as RLE       Communication   Communication: No difficulties  Cognition Arousal/Alertness: Awake/alert Behavior During Therapy: WFL for tasks assessed/performed Overall Cognitive Status: Within Functional Limits for tasks assessed                                        General Comments General comments (skin integrity, edema, etc.): Pt is in great pain in BLE, very apprehensive to any movement of BLE  and only has trace movements on his own. scooting to EOB with max a, attempted to sit EOB with B feet dangling off but unable to tolerate this at this time    Exercises     Assessment/Plan    PT Assessment Patient needs continued PT services(while in hospital)  PT Problem List Decreased strength;Decreased range of motion;Decreased activity tolerance;Decreased balance;Decreased mobility;Decreased coordination;Decreased safety awareness;Pain       PT Treatment Interventions Gait training;Functional mobility training;Therapeutic activities;Therapeutic exercise;Balance training;Neuromuscular re-education;Patient/family education    PT Goals (Current goals can be found in the Care Plan section)  Acute Rehab PT Goals Patient Stated Goal: reduce pain to be able to move around on his own PT Goal Formulation: With patient Time For Goal Achievement: 01/05/19 Potential to Achieve Goals: Fair    Frequency Min 3X/week   Barriers to discharge Other (comment)(inability to tolerate movement)      Co-evaluation               AM-PAC PT "6 Clicks" Mobility  Outcome Measure Help needed turning from your back to your side while in a flat bed without using bedrails?: A Lot Help needed moving from lying on your back to sitting on the side of a flat bed without using bedrails?: A Lot Help needed moving to and from a bed to a chair (including a wheelchair)?: Total Help needed standing up from a chair using your arms (e.g., wheelchair or bedside chair)?: Total Help needed to walk in hospital room?: Total Help needed climbing 3-5 steps with a railing? : Total 6 Click Score: 8    End of Session   Activity Tolerance: No increased pain Patient left: in bed;with call bell/phone within reach   PT Visit Diagnosis: Other abnormalities of gait and mobility (R26.89);Muscle weakness (generalized) (M62.81);Pain Pain - part of body: Leg    Time: 1225-1251 PT Time Calculation (min) (ACUTE ONLY): 26  min   Charges:   PT Evaluation $PT Eval High Complexity: 1 High PT Treatments $Therapeutic Activity: 8-22 mins        Horald Chestnut, PT   Delford Field 12/22/2018, 3:19 PM

## 2018-12-22 NOTE — Progress Notes (Signed)
Patient's spouse updated by telephone.  Very appreciative of update.  All questions welcomed and answered.

## 2018-12-22 NOTE — Progress Notes (Signed)
[facility] PROGRESS NOTE    Progress Note  Steven Bradley  [id number] DOB: 02-01-1946 DOA: 12/21/2018 PCP: [facility]     Brief Narrative:   Steven Bradley is an 73 y.o. male past medical history of GERD, essential hypertension PTSD comes into the hospital complaining of right knee hip and elbow pain denies any trauma or falls.  He started having fevers the day prior to admission.  Had a mild leukocytosis and a fever in admission he was admitted and started the work-up for sepsis which was unremarkable who SARS-CoV-2 test came back positive  Assessment/Plan:   SIRS likely due to gout: Prolcalcitnonin 0.5, on admission with a temperature of 102.4. He is satting greater 94% on room air he expresses no respiratory difficulties. Is a leukocytosis with a left shift, sed rate of 115 He reports multiple joint pain but no significant swelling except for the knees. Blood cultures were sent. Discontinue IV Rocephin. We will start him empirically on ibuprofen orally and Protonix twice daily for GI prophylaxis. He relates he takes prednisone as needed at home.  COVID-19 viral infection: He relates no cough shortness of breath diarrhea nausea or vomiting. The time of this note he remains asymptomatic from a COVID-19 standpoint.  Probably his COVID-19 is triggering his gout flare.  Elevated LFT's: Setting of infection continue to monitor.  Essential hypertension/chronic diastolic heart failure: On Lasix at home currently holding improved intake gentle hydration continue Norvasc.  Hyperlipidemia: Holding Lipitor in the setting of mildly elevated LFTs   DVT prophylaxis: lovenox Family Communication:none Disposition Plan/Barrier to D/C: home in 3-4 days Code Status:     Code Status Orders  (From admission, onward)         Start     Ordered   12/21/18 1725  Full code  Continuous     12/21/18 1734        Code Status History    Date Active Date Inactive  Code Status Order ID Comments User Context   06/01/2016 2228 06/04/2016 1841 Full Code [id number]  Albertine Patricia, MD Inpatient   12/29/2013 1609 12/31/2013 1338 Full Code [id number]  Rexene Alberts, MD ED   Advance Care Planning Activity    Advance Directive Documentation     Most Recent Value  Type of Advance Directive  Living will  Pre-existing out of facility DNR order (yellow form or pink MOST form)  -  "MOST" Form in Place?  -        IV Access:    Peripheral IV   Procedures and diagnostic studies:   Dg Chest Port 1 View  Result Date: 12/21/2018 CLINICAL DATA:  Shortness of breath. EXAM: PORTABLE CHEST 1 VIEW COMPARISON:  Radiograph June 01, 2016. FINDINGS: The heart size and mediastinal contours are within normal limits. Both lungs are clear. No pneumothorax or pleural effusion is noted. The visualized skeletal structures are unremarkable. IMPRESSION: No active disease. Electronically Signed   By: Marijo Conception M.D.   On: 12/21/2018 12:49     Medical Consultants:    None.  Anti-Infectives:   Single dose of vancomycin, Rocephin and Flagyl  Subjective:    Trinna Balloon relates that his pain is unbearable.  Objective:    Vitals:   12/21/18 2100 12/21/18 2300 12/21/18 2339 12/22/18 0500  BP: (!) 149/88 (!) 160/98 (!) 157/100 (!) 152/95  Pulse: 93     Resp: 17     Temp: 98.3 F (36.8 C) 98.8 F (37.1 C)  98.6 F (37 C)  TempSrc: Oral Oral  Oral  SpO2: 100% 94%    Weight:      Height:       SpO2: 94 %   Intake/Output Summary (Last 24 hours) at 12/22/2018 0748 Last data filed at 12/22/2018 0439 Gross per 24 hour  Intake 4275.15 ml  Output 600 ml  Net 3675.15 ml   Filed Weights   12/21/18 1046  Weight: 102.5 kg    Exam: General exam: In no acute distress. Respiratory system: Good air movement and clear to auscultation. Cardiovascular system: S1 & S2 heard, RRR. No JVD. Gastrointestinal system: Abdomen is nondistended, soft and nontender.   Central nervous system: Alert and oriented. No focal neurological deficits. Extremities: Multiple swollen joints bilaterally including bilateral ankles bilateral knees bilateral podagra.  He also expresses right elbow swelling and exquisite pain with left shoulder swelling and pain Skin: No rashes, lesions or ulcers Psychiatry: Judgement and insight appear normal. Mood & affect appropriate.   Data Reviewed:    Labs: Basic Metabolic Panel: Recent Labs  Lab 12/21/18 1051  NA 133*  K 3.6  CL 94*  CO2 23  GLUCOSE 136*  BUN 13  CREATININE 0.91  CALCIUM 9.0   GFR Estimated Creatinine Clearance: 96.3 mL/min (by C-G formula based on SCr of 0.91 mg/dL). Liver Function Tests: Recent Labs  Lab 12/21/18 1051  AST 63*  ALT 31  ALKPHOS 132*  BILITOT 4.0*  PROT 9.0*  ALBUMIN 3.6   No results for input(s): LIPASE, AMYLASE in the last 168 hours. No results for input(s): AMMONIA in the last 168 hours. Coagulation profile Recent Labs  Lab 12/21/18 1209  INR 1.2   COVID-19 Labs  Recent Labs    12/21/18 1829 12/21/18 1858  DDIMER  --  3.30*  FERRITIN 740*  --   LDH  --  101  CRP 11.3*  --     Lab Results  Component Value Date   SARSCOV2NAA POSITIVE (A) 12/21/2018    CBC: Recent Labs  Lab 12/21/18 1051  WBC 15.6*  NEUTROABS 13.4*  HGB 13.1  HCT 38.5*  MCV 106.6*  PLT 182   Cardiac Enzymes: No results for input(s): CKTOTAL, CKMB, CKMBINDEX, TROPONINI in the last 168 hours. BNP (last 3 results) No results for input(s): PROBNP in the last 8760 hours. CBG: No results for input(s): GLUCAP in the last 168 hours. D-Dimer: Recent Labs    12/21/18 1858  DDIMER 3.30*   Hgb A1c: No results for input(s): HGBA1C in the last 72 hours. Lipid Profile: No results for input(s): CHOL, HDL, LDLCALC, TRIG, CHOLHDL, LDLDIRECT in the last 72 hours. Thyroid function studies: No results for input(s): TSH, T4TOTAL, T3FREE, THYROIDAB in the last 72 hours.  Invalid input(s):  FREET3 Anemia work up: Recent Labs    12/21/18 [place] 740*   Sepsis Labs: Recent Labs  Lab 12/21/18 1048 12/21/18 1051 12/21/18 1608 12/21/18 1858  PROCALCITON  --   --   --  0.54  WBC  --  15.6*  --   --   LATICACIDVEN 2.5*  --  1.7  --    Microbiology Recent Results (from the past 240 hour(s))  SARS Coronavirus 2 by RT PCR (hospital order, performed in [facility] hospital lab) Nasopharyngeal Nasopharyngeal Swab     Status: Abnormal   Collection Time: 12/21/18 10:50 AM   Specimen: Nasopharyngeal Swab  Result Value Ref Range Status   SARS Coronavirus 2 POSITIVE (A) NEGATIVE Final  Comment: RESULT CALLED TO, READ BACK BY AND VERIFIED WITH: N.MOSCINSKI AT [id number] ON 12/21/18 BY N.THOMPSON (NOTE) If result is NEGATIVE SARS-CoV-2 target nucleic acids are NOT DETECTED. The SARS-CoV-2 RNA is generally detectable in upper and lower  respiratory specimens during the acute phase of infection. The lowest  concentration of SARS-CoV-2 viral copies this assay can detect is 250  copies / mL. A negative result does not preclude SARS-CoV-2 infection  and should not be used as the sole basis for treatment or other  patient management decisions.  A negative result may occur with  improper specimen collection / handling, submission of specimen other  than nasopharyngeal swab, presence of viral mutation(s) within the  areas targeted by this assay, and inadequate number of viral copies  (<250 copies / mL). A negative result must be combined with clinical  observations, patient history, and epidemiological information. If result is POSITIVE SARS-CoV-2 target nucleic acids are DET ECTED. The SARS-CoV-2 RNA is generally detectable in upper and lower  respiratory specimens during the acute phase of infection.  Positive  results are indicative of active infection with SARS-CoV-2.  Clinical  correlation with patient history and other diagnostic information is  necessary to determine  patient infection status.  Positive results do  not rule out bacterial infection or co-infection with other viruses. If result is PRESUMPTIVE POSTIVE SARS-CoV-2 nucleic acids MAY BE PRESENT.   A presumptive positive result was obtained on the submitted specimen  and confirmed on repeat testing.  While 2019 novel coronavirus  (SARS-CoV-2) nucleic acids may be present in the submitted sample  additional confirmatory testing may be necessary for epidemiological  and / or clinical management purposes  to differentiate between  SARS-CoV-2 and other Sarbecovirus currently known to infect humans.  If clinically indicated additional testing with an alternate test  methodology ([place]) is advised. The SARS-CoV-2 RNA is generally  detectable in upper and lower respiratory specimens during the acute  phase of infection. The expected result is Negative. Fact Sheet for Patients:  StrictlyIdeas.no Fact Sheet for Healthcare Providers: BankingDealers.co.za This test is not yet approved or cleared by the [place] FDA and has been authorized for detection and/or diagnosis of SARS-CoV-2 by FDA under an Emergency Use Authorization (EUA).  This EUA will remain in effect (meaning this test can be used) for the duration of the COVID-19 declaration under Section 564(b)(1) of the Act, 21 U.S.C. section 360bbb-3(b)(1), unless the authorization is terminated or revoked sooner. Performed at [facility], [place] [address], [place], [place] [address]      Medications:   . docusate sodium  100 mg Oral BID  . enoxaparin (LOVENOX) injection  40 mg Subcutaneous Q24H  . vitamin C  500 mg Oral Daily  . zinc sulfate  220 mg Oral Daily   Continuous Infusions: . cefTRIAXone (ROCEPHIN)  IV Stopped (12/21/18 1220)  . lactated ringers 100 mL/hr at 12/22/18 0559      LOS: 1 day   [name]  [facility]  12/22/2018, 7:48 AM

## 2018-12-23 DIAGNOSIS — U071 COVID-19: Secondary | ICD-10-CM | POA: Diagnosis not present

## 2018-12-23 LAB — COMPREHENSIVE METABOLIC PANEL
ALT: 22 U/L (ref 0–44)
AST: 45 U/L — ABNORMAL HIGH (ref 15–41)
Albumin: 2.7 g/dL — ABNORMAL LOW (ref 3.5–5.0)
Alkaline Phosphatase: 114 U/L (ref 38–126)
Anion gap: 11 (ref 5–15)
BUN: 18 mg/dL (ref 8–23)
CO2: 21 mmol/L — ABNORMAL LOW (ref 22–32)
Calcium: 8.6 mg/dL — ABNORMAL LOW (ref 8.9–10.3)
Chloride: 100 mmol/L (ref 98–111)
Creatinine, Ser: 0.66 mg/dL (ref 0.61–1.24)
GFR calc Af Amer: >60 mL/min (ref >60)
GFR calc non Af Amer: >60 mL/min (ref >60)
Glucose, Bld: 117 mg/dL — ABNORMAL HIGH (ref 70–99)
Potassium: 3.4 mmol/L — ABNORMAL LOW (ref 3.5–5.1)
Sodium: 132 mmol/L — ABNORMAL LOW (ref 135–145)
Total Bilirubin: 1.5 mg/dL — ABNORMAL HIGH (ref 0.3–1.2)
Total Protein: 7.1 g/dL (ref 6.5–8.1)

## 2018-12-23 LAB — CBC WITH DIFFERENTIAL/PLATELET
Abs Immature Granulocytes: 0.05 10*3/uL (ref 0.00–0.07)
Basophils Absolute: 0 10*3/uL (ref 0.0–0.1)
Basophils Relative: 0 %
Eosinophils Absolute: 0 10*3/uL (ref 0.0–0.5)
Eosinophils Relative: 0 %
HCT: 30.3 % — ABNORMAL LOW (ref 39.0–52.0)
Hemoglobin: 10.4 g/dL — ABNORMAL LOW (ref 13.0–17.0)
Immature Granulocytes: 1 %
Lymphocytes Relative: 8 %
Lymphs Abs: 0.8 10*3/uL (ref 0.7–4.0)
MCH: 35.9 pg — ABNORMAL HIGH (ref 26.0–34.0)
MCHC: 34.3 g/dL (ref 30.0–36.0)
MCV: 104.5 fL — ABNORMAL HIGH (ref 80.0–100.0)
Monocytes Absolute: 0.8 10*3/uL (ref 0.1–1.0)
Monocytes Relative: 8 %
Neutro Abs: 8.2 10*3/uL — ABNORMAL HIGH (ref 1.7–7.7)
Neutrophils Relative %: 83 %
Platelets: 129 10*3/uL — ABNORMAL LOW (ref 150–400)
RBC: 2.9 MIL/uL — ABNORMAL LOW (ref 4.22–5.81)
RDW: 12.9 % (ref 11.5–15.5)
WBC: 9.8 10*3/uL (ref 4.0–10.5)
nRBC: 0 % (ref 0.0–0.2)

## 2018-12-23 LAB — FERRITIN: Ferritin: 669 ng/mL — ABNORMAL HIGH (ref 24–336)

## 2018-12-23 LAB — C-REACTIVE PROTEIN: CRP: 9.3 mg/dL — ABNORMAL HIGH (ref <1.0)

## 2018-12-23 LAB — MAGNESIUM: Magnesium: 1.1 mg/dL — ABNORMAL LOW (ref 1.7–2.4)

## 2018-12-23 LAB — D-DIMER, QUANTITATIVE: D-Dimer, Quant: 2.8 ug/mL-FEU — ABNORMAL HIGH (ref 0.00–0.50)

## 2018-12-23 MED ORDER — POTASSIUM CHLORIDE CRYS ER 20 MEQ PO TBCR
40.0000 meq | EXTENDED_RELEASE_TABLET | Freq: Once | ORAL | Status: AC
  Administered 2018-12-23: 40 meq via ORAL
  Filled 2018-12-23: qty 2

## 2018-12-23 MED ORDER — SODIUM CHLORIDE 0.9 % IV SOLN
INTRAVENOUS | Status: AC
  Administered 2018-12-23: 20:00:00 via INTRAVENOUS

## 2018-12-23 NOTE — Progress Notes (Signed)
Physical Therapy Treatment Patient Details Name: JAHSIR KOBASHIGAWA MRN: [id number] DOB: 21-Sep-1945 Today's Date: 12/23/2018    History of Present Illness 73 y/o male w/ hx of HTN, HLD, enlarged prostate, PTSD, GERD, gout, syncope presented w/ polyarthralgias and fever.    PT Comments    Much better tolerance of tx this am, pt able to get oob with SBA able to complete transfers with SBA and ambulated approx 358ft with RW and SBA. Pt was on room air throughout and sats in high 90s. D/c disposition has been amended to reflect progress.     Follow Up Recommendations  No PT follow up     Equipment Recommendations  Other (comment)(needs RW in his height)    Recommendations for Other Services       Precautions / Restrictions Precautions Precautions: Fall Restrictions Weight Bearing Restrictions: No    Mobility  Bed Mobility Overal bed mobility: Needs Assistance Bed Mobility: Sit to Supine;Supine to Sit Rolling: Supervision   Supine to sit: Supervision        Transfers Overall transfer level: Needs assistance Equipment used: Rolling walker (2 wheeled) Transfers: Sit to/from Stand Sit to Stand: Supervision            Ambulation/Gait Ambulation/Gait assistance: Supervision Gait Distance (Feet): 300 Feet Assistive device: Rolling walker (2 wheeled) Gait Pattern/deviations: Antalgic;Trunk flexed     General Gait Details: ambulated in hall with RW and SBA on room air, monitor has been d/c prior to tx   Stairs             Wheelchair Mobility    Modified Rankin (Stroke Patients Only)       Balance Overall balance assessment: Needs assistance Sitting-balance support: Feet supported Sitting balance-Leahy Scale: Normal     Standing balance support: Bilateral upper extremity supported Standing balance-Leahy Scale: Fair Standing balance comment: tolerates standing much better today than previous session                            Cognition  Arousal/Alertness: Awake/alert Behavior During Therapy: WFL for tasks assessed/performed Overall Cognitive Status: Within Functional Limits for tasks assessed                                        Exercises      General Comments General comments (skin integrity, edema, etc.): Pt pain has decreased states has been exercises legs through night and this am. Pt was able to tolerate more mobility today being able to ambulate 342ft with RW and SBA on room air, sats remained in high 90s throughout.      Pertinent Vitals/Pain Pain Assessment: 0-10 Pain Score: 5  Pain Location: BLE Pain Descriptors / Indicators: Grimacing;Discomfort Pain Intervention(s): Limited activity within patient's tolerance;Monitored during session    Home Living                      Prior Function            PT Goals (current goals can now be found in the care plan section) Acute Rehab PT Goals PT Goal Formulation: With patient Time For Goal Achievement: 01/05/19 Potential to Achieve Goals: Good Progress towards PT goals: Progressing toward goals    Frequency    Min 3X/week      PT Plan Discharge plan needs to be updated    Co-evaluation  AM-PAC PT "6 Clicks" Mobility   Outcome Measure  Help needed turning from your back to your side while in a flat bed without using bedrails?: None Help needed moving from lying on your back to sitting on the side of a flat bed without using bedrails?: None Help needed moving to and from a bed to a chair (including a wheelchair)?: None Help needed standing up from a chair using your arms (e.g., wheelchair or bedside chair)?: A Little Help needed to walk in hospital room?: A Little Help needed climbing 3-5 steps with a railing? : A Little 6 Click Score: 21    End of Session   Activity Tolerance: Patient tolerated treatment well Patient left: in bed;with call bell/phone within reach Nurse Communication: Mobility  status PT Visit Diagnosis: Other abnormalities of gait and mobility (R26.89);Muscle weakness (generalized) (M62.81);Pain Pain - part of body: Leg     Time: [id number] PT Time Calculation (min) (ACUTE ONLY): 16 min  Charges:  $Therapeutic Exercise: 8-22 mins                     Horald Chestnut, PT    Delford Field 12/23/2018, 1:19 PM

## 2018-12-23 NOTE — Plan of Care (Signed)
Updated the pt's wife on the phone. Discharge to home tomorrow is still the plan. Pt was treated for pain per patient's request. Pt's heart rhythm is irregular per auscultation and pt states that he has a history of atrial fibrillation. Heart rate is not above 100 bpm. Flushed IV and started continuous maintenance IV fluid per MD order.

## 2018-12-23 NOTE — Progress Notes (Signed)
Patient's wife updated by telephone.  Anticipating patient to be discharged tomorrow.  Wife states she will need to bring clothes for patient to discharge home in tomorrow when she picks him up.  Charge nurse notified.

## 2018-12-23 NOTE — Progress Notes (Signed)
[facility] PROGRESS NOTE    Progress Note  Steven Bradley  [id number] DOB: [birth year] DOA: 12/21/2018 PCP: [facility]     Brief Narrative:   Steven Bradley is an 73 y.o. male past medical history of GERD, essential hypertension PTSD comes into the hospital complaining of right knee hip and elbow pain denies any trauma or falls.  He started having fevers the day prior to admission.  Had a mild leukocytosis and a fever in admission he was admitted and started the work-up for sepsis which was unremarkable who SARS-CoV-2 test came back positive  Assessment/Plan:   SIRS likely due to gout: Prolcalcitnonin 0.5, on admission with a temperature of 102.4. He is satting greater 94% on room air he expresses no respiratory difficulties. Leukocytosis is slowly improving, he still has a persistent left shift. He was started on ibuprofen for gout flare and Protonix for GI prophylaxis. Blood cultures remain negative to date. This is not sepsis.  COVID-19 viral infection: He relates no cough shortness of breath diarrhea nausea or vomiting. The time of this note he remains asymptomatic from a COVID-19 standpoint.  Probably his COVID-19 is triggering his gout flare.  Elevated LFT's: Setting of infection continue to monitor.  Essential hypertension/chronic diastolic heart failure: On Lasix at home currently holding improved intake gentle hydration continue Norvasc.  Hyperlipidemia: Holding Lipitor in the setting of mildly elevated LFTs   DVT prophylaxis: lovenox Family Communication:none Disposition Plan/Barrier to D/C: home in am Code Status:     Code Status Orders  (From admission, onward)         Start     Ordered   12/21/18 1725  Full code  Continuous     12/21/18 1734        Code Status History    Date Active Date Inactive Code Status Order ID Comments User Context   06/01/2016 2228 06/04/2016 1841 Full Code [id number]  Albertine Patricia, MD Inpatient    12/29/2013 1609 12/31/2013 1338 Full Code [id number]  Rexene Alberts, MD ED   Advance Care Planning Activity    Advance Directive Documentation     Most Recent Value  Type of Advance Directive  Living will  Pre-existing out of facility DNR order (yellow form or pink MOST form)  -  "MOST" Form in Place?  -        IV Access:    Peripheral IV   Procedures and diagnostic studies:   Dg Chest Port 1 View  Result Date: 12/21/2018 CLINICAL DATA:  Shortness of breath. EXAM: PORTABLE CHEST 1 VIEW COMPARISON:  Radiograph June 01, 2016. FINDINGS: The heart size and mediastinal contours are within normal limits. Both lungs are clear. No pneumothorax or pleural effusion is noted. The visualized skeletal structures are unremarkable. IMPRESSION: No active disease. Electronically Signed   By: Marijo Conception M.D.   On: 12/21/2018 12:49     Medical Consultants:    None.  Anti-Infectives:   Single dose of vancomycin, Rocephin and Flagyl  Subjective:    Trinna Balloon was walking around the room sitting in the chair eating his food he relates he feels significantly improved compared to yesterday.  Objective:    Vitals:   12/22/18 1617 12/22/18 1959 12/22/18 2000 12/23/18 0440  BP: 140/86  (!) 131/96 135/90  Pulse: 83  91 83  Resp: 16  19 16   Temp: 98.4 F (36.9 C) 97.7 F (36.5 C)  98.5 F (36.9 C)  TempSrc: Oral Oral  Oral  SpO2: 100%  100% 98%  Weight:      Height:       SpO2: 98 %   Intake/Output Summary (Last 24 hours) at 12/23/2018 0731 Last data filed at 12/23/2018 0440 Gross per 24 hour  Intake 1129.01 ml  Output 750 ml  Net 379.01 ml   Filed Weights   12/21/18 1046  Weight: 102.5 kg    Exam: General exam: In no acute distress. Respiratory system: Good air movement and clear to auscultation. Cardiovascular system: S1 & S2 heard, RRR. No JVD. Gastrointestinal system: Abdomen is nondistended, soft and nontender.  Central nervous system: Alert and  oriented. No focal neurological deficits. Extremities: No pedal edema. Skin: No rashes, lesions or ulcers Psychiatry: Judgement and insight appear normal. Mood & affect appropriate.   Data Reviewed:    Labs: Basic Metabolic Panel: Recent Labs  Lab 12/21/18 1051 12/23/18 0318  NA 133* 132*  K 3.6 3.4*  CL 94* 100  CO2 23 21*  GLUCOSE 136* 117*  BUN 13 18  CREATININE 0.91 0.66  CALCIUM 9.0 8.6*  MG  --  1.1*   GFR Estimated Creatinine Clearance: 109.5 mL/min (by C-G formula based on SCr of 0.66 mg/dL). Liver Function Tests: Recent Labs  Lab 12/21/18 1051 12/23/18 0318  AST 63* 45*  ALT 31 22  ALKPHOS 132* 114  BILITOT 4.0* 1.5*  PROT 9.0* 7.1  ALBUMIN 3.6 2.7*   No results for input(s): LIPASE, AMYLASE in the last 168 hours. No results for input(s): AMMONIA in the last 168 hours. Coagulation profile Recent Labs  Lab 12/21/18 1209  INR 1.2   COVID-19 Labs  Recent Labs    12/21/18 1829 12/21/18 1858 12/23/18 0318  DDIMER  --  3.30* 2.80*  FERRITIN 740*  --   --   LDH  --  101  --   CRP 11.3*  --   --     Lab Results  Component Value Date   SARSCOV2NAA POSITIVE (A) 12/21/2018    CBC: Recent Labs  Lab 12/21/18 1051 12/23/18 0318  WBC 15.6* 9.8  NEUTROABS 13.4* 8.2*  HGB 13.1 10.4*  HCT 38.5* 30.3*  MCV 106.6* 104.5*  PLT 182 129*   Cardiac Enzymes: No results for input(s): CKTOTAL, CKMB, CKMBINDEX, TROPONINI in the last 168 hours. BNP (last 3 results) No results for input(s): PROBNP in the last 8760 hours. CBG: No results for input(s): GLUCAP in the last 168 hours. D-Dimer: Recent Labs    12/21/18 1858 12/23/18 0318  DDIMER 3.30* 2.80*   Hgb A1c: No results for input(s): HGBA1C in the last 72 hours. Lipid Profile: No results for input(s): CHOL, HDL, LDLCALC, TRIG, CHOLHDL, LDLDIRECT in the last 72 hours. Thyroid function studies: No results for input(s): TSH, T4TOTAL, T3FREE, THYROIDAB in the last 72 hours.  Invalid input(s):  FREET3 Anemia work up: Recent Labs    12/21/18 [place] 740*   Sepsis Labs: Recent Labs  Lab 12/21/18 1048 12/21/18 1051 12/21/18 1608 12/21/18 1858 12/23/18 0318  PROCALCITON  --   --   --  0.54  --   WBC  --  15.6*  --   --  9.8  LATICACIDVEN 2.5*  --  1.7  --   --    Microbiology Recent Results (from the past 240 hour(s))  Blood Culture (routine x 2)     Status: None (Preliminary result)   Collection Time: 12/21/18 10:48 AM   Specimen: BLOOD  Result Value Ref Range  Status   Specimen Description   Final    BLOOD RIGHT ANTECUBITAL Performed at [facility] [address] [place], [place] [address]    Special Requests   Final    BOTTLES DRAWN AEROBIC ONLY Blood Culture results may not be optimal due to an inadequate volume of blood received in culture bottles Performed at [facility] [address] [place], [place] [address]    Culture   Final    NO GROWTH < 24 HOURS Performed at [facility] [address] [place], [place] [address]    Report Status PENDING  Incomplete  SARS Coronavirus 2 by RT PCR (hospital order, performed in [facility] hospital lab) Nasopharyngeal Nasopharyngeal Swab     Status: Abnormal   Collection Time: 12/21/18 10:50 AM   Specimen: Nasopharyngeal Swab  Result Value Ref Range Status   SARS Coronavirus 2 POSITIVE (A) NEGATIVE Final    Comment: RESULT CALLED TO, READ BACK BY AND VERIFIED WITH: N.MOSCINSKI AT [id number] ON 12/21/18 BY N.THOMPSON (NOTE) If result is NEGATIVE SARS-CoV-2 target nucleic acids are NOT DETECTED. The SARS-CoV-2 RNA is generally detectable in upper and lower  respiratory specimens during the acute phase of infection. The lowest  concentration of SARS-CoV-2 viral copies this assay can detect is 250  copies / mL. A negative result does not preclude SARS-CoV-2 infection  and should not be used as the sole basis for treatment or other  patient management decisions.  A  negative result may occur with  improper specimen collection / handling, submission of specimen other  than nasopharyngeal swab, presence of viral mutation(s) within the  areas targeted by this assay, and inadequate number of viral copies  (<250 copies / mL). A negative result must be combined with clinical  observations, patient history, and epidemiological information. If result is POSITIVE SARS-CoV-2 target nucleic acids are DET ECTED. The SARS-CoV-2 RNA is generally detectable in upper and lower  respiratory specimens during the acute phase of infection.  Positive  results are indicative of active infection with SARS-CoV-2.  Clinical  correlation with patient history and other diagnostic information is  necessary to determine patient infection status.  Positive results do  not rule out bacterial infection or co-infection with other viruses. If result is PRESUMPTIVE POSTIVE SARS-CoV-2 nucleic acids MAY BE PRESENT.   A presumptive positive result was obtained on the submitted specimen  and confirmed on repeat testing.  While 2019 novel coronavirus  (SARS-CoV-2) nucleic acids may be present in the submitted sample  additional confirmatory testing may be necessary for epidemiological  and / or clinical management purposes  to differentiate between  SARS-CoV-2 and other Sarbecovirus currently known to infect humans.  If clinically indicated additional testing with an alternate test  methodology ([place]) is advised. The SARS-CoV-2 RNA is generally  detectable in upper and lower respiratory specimens during the acute  phase of infection. The expected result is Negative. Fact Sheet for Patients:  StrictlyIdeas.no Fact Sheet for Healthcare Providers: BankingDealers.co.za This test is not yet approved or cleared by the [place] FDA and has been authorized for detection and/or diagnosis of SARS-CoV-2 by FDA under an Emergency Use  Authorization (EUA).  This EUA will remain in effect (meaning this test can be used) for the duration of the COVID-19 declaration under Section 564(b)(1) of the Act, 21 U.S.C. section 360bbb-3(b)(1), unless the authorization is terminated or revoked sooner. Performed at [facility], [address] [place], [place]  [address]   Blood Culture (routine x 2)     Status: None (Preliminary result)   Collection Time: 12/21/18 11:50 AM   Specimen: BLOOD RIGHT FOREARM  Result Value Ref Range Status   Specimen Description   Final    BLOOD RIGHT FOREARM Performed at [facility] [address], [place], [place] [address]    Special Requests   Final    BOTTLES DRAWN AEROBIC AND ANAEROBIC Blood Culture adequate volume Performed at [facility] [address] [place], [place] [address]    Culture   Final    NO GROWTH < 24 HOURS Performed at [facility] [address], [place], [place] [address]    Report Status PENDING  Incomplete  Urine culture     Status: None   Collection Time: 12/21/18  3:11 PM   Specimen: In/Out Cath Urine  Result Value Ref Range Status   Specimen Description   Final    IN/OUT CATH URINE Performed at [facility] [address] [place], [place] [address]    Special Requests   Final    NONE Performed at [facility], [place] [address] [place], [place] [address]    Culture   Final    NO GROWTH Performed at [facility], [place] [address], [place], [place] [address]    Report Status 12/22/2018 FINAL  Final     Medications:   . docusate sodium  100 mg Oral BID  . enoxaparin (LOVENOX) injection  40 mg Subcutaneous Q24H  . ibuprofen  600 mg Oral QID  . pantoprazole  40 mg Oral BID  . vitamin C  500 mg Oral Daily  . zinc sulfate  220 mg Oral Daily   Continuous Infusions: . sodium chloride 100 mL/hr at 12/22/18 2112      LOS: 2 days   [name]  [facility]  12/23/2018, 7:31 AM

## 2018-12-24 DIAGNOSIS — M1009 Idiopathic gout, multiple sites: Principal | ICD-10-CM

## 2018-12-24 LAB — COMPREHENSIVE METABOLIC PANEL
ALT: 23 U/L (ref 0–44)
AST: 45 U/L — ABNORMAL HIGH (ref 15–41)
Albumin: 2.7 g/dL — ABNORMAL LOW (ref 3.5–5.0)
Alkaline Phosphatase: 104 U/L (ref 38–126)
Anion gap: 10 (ref 5–15)
BUN: 18 mg/dL (ref 8–23)
CO2: 23 mmol/L (ref 22–32)
Calcium: 8.7 mg/dL — ABNORMAL LOW (ref 8.9–10.3)
Chloride: 102 mmol/L (ref 98–111)
Creatinine, Ser: 0.7 mg/dL (ref 0.61–1.24)
GFR calc Af Amer: >60 mL/min (ref >60)
GFR calc non Af Amer: >60 mL/min (ref >60)
Glucose, Bld: 90 mg/dL (ref 70–99)
Potassium: 3.9 mmol/L (ref 3.5–5.1)
Sodium: 135 mmol/L (ref 135–145)
Total Bilirubin: 1.5 mg/dL — ABNORMAL HIGH (ref 0.3–1.2)
Total Protein: 6.8 g/dL (ref 6.5–8.1)

## 2018-12-24 LAB — CBC WITH DIFFERENTIAL/PLATELET
Abs Immature Granulocytes: 0.06 10*3/uL (ref 0.00–0.07)
Basophils Absolute: 0.1 10*3/uL (ref 0.0–0.1)
Basophils Relative: 1 %
Eosinophils Absolute: 0.2 10*3/uL (ref 0.0–0.5)
Eosinophils Relative: 2 %
HCT: 30.5 % — ABNORMAL LOW (ref 39.0–52.0)
Hemoglobin: 10.4 g/dL — ABNORMAL LOW (ref 13.0–17.0)
Immature Granulocytes: 1 %
Lymphocytes Relative: 14 %
Lymphs Abs: 1.2 10*3/uL (ref 0.7–4.0)
MCH: 35.9 pg — ABNORMAL HIGH (ref 26.0–34.0)
MCHC: 34.1 g/dL (ref 30.0–36.0)
MCV: 105.2 fL — ABNORMAL HIGH (ref 80.0–100.0)
Monocytes Absolute: 1 10*3/uL (ref 0.1–1.0)
Monocytes Relative: 11 %
Neutro Abs: 6.4 10*3/uL (ref 1.7–7.7)
Neutrophils Relative %: 71 %
Platelets: 152 10*3/uL (ref 150–400)
RBC: 2.9 MIL/uL — ABNORMAL LOW (ref 4.22–5.81)
RDW: 13.1 % (ref 11.5–15.5)
WBC: 8.9 10*3/uL (ref 4.0–10.5)
nRBC: 0 % (ref 0.0–0.2)

## 2018-12-24 LAB — C-REACTIVE PROTEIN: CRP: 4.5 mg/dL — ABNORMAL HIGH (ref <1.0)

## 2018-12-24 LAB — D-DIMER, QUANTITATIVE: D-Dimer, Quant: 3.35 ug/mL-FEU — ABNORMAL HIGH (ref 0.00–0.50)

## 2018-12-24 LAB — MAGNESIUM: Magnesium: 1.2 mg/dL — ABNORMAL LOW (ref 1.7–2.4)

## 2018-12-24 LAB — FERRITIN: Ferritin: 593 ng/mL — ABNORMAL HIGH (ref 24–336)

## 2018-12-24 MED ORDER — OMEPRAZOLE 20 MG PO CPDR: 40.0000 mg | DELAYED_RELEASE_CAPSULE | Freq: Two times a day (BID) | ORAL | 0 refills | Status: AC

## 2018-12-24 MED ORDER — IBUPROFEN 200 MG PO TABS
200.0000 mg | ORAL_TABLET | ORAL | Status: DC | PRN
  Filled 2018-12-24: qty 1

## 2018-12-24 MED ORDER — INDOMETHACIN 50 MG PO CAPS
50.0000 mg | ORAL_CAPSULE | Freq: Three times a day (TID) | ORAL | Status: DC | PRN
  Filled 2018-12-24: qty 1

## 2018-12-24 MED ORDER — INDOMETHACIN 50 MG PO CAPS: 50.0000 mg | ORAL_CAPSULE | Freq: Three times a day (TID) | ORAL | Status: DC | PRN

## 2018-12-24 MED ORDER — IBUPROFEN 200 MG PO TABS: 200.0000 mg | ORAL_TABLET | ORAL | 0 refills | Status: DC | PRN

## 2018-12-24 MED ORDER — IBUPROFEN 200 MG PO TABS: 400.0000 mg | ORAL_TABLET | Freq: Four times a day (QID) | ORAL | 0 refills | Status: DC | PRN

## 2018-12-24 NOTE — Discharge Instructions (Signed)
COVID-19: How to Protect Yourself and Others °Know how it spreads °· There is currently no vaccine to prevent coronavirus disease 2019 (COVID-19). °· The best way to prevent illness is to avoid being exposed to this virus. °· The virus is thought to spread mainly from person-to-person. °? Between people who are in close contact with one another (within about 6 feet). °? Through respiratory droplets produced when an infected person coughs, sneezes or talks. °? These droplets can land in the mouths or noses of people who are nearby or possibly be inhaled into the lungs. °? Some recent studies have suggested that COVID-19 may be spread by people who are not showing symptoms. °Everyone should °Clean your hands often °· Wash your hands often with soap and water for at least 20 seconds especially after you have been in a public place, or after blowing your nose, coughing, or sneezing. °· If soap and water are not readily available, use a hand sanitizer that contains at least 60% alcohol. Cover all surfaces of your hands and rub them together until they feel dry. °· Avoid touching your eyes, nose, and mouth with unwashed hands. °Avoid close contact °· Stay home if you are sick. °· Avoid close contact with people who are sick. °· Put distance between yourself and other people. °? Remember that some people without symptoms may be able to spread virus. °? This is especially important for people who are at higher risk of getting very sick.www.cdc.gov/coronavirus/2019-ncov/need-extra-precautions/people-at-higher-risk.html °Cover your mouth and nose with a cloth face cover when around others °· You could spread COVID-19 to others even if you do not feel sick. °· Everyone should wear a cloth face cover when they have to go out in public, for example to the grocery store or to pick up other necessities. °? Cloth face coverings should not be placed on young children under age 2, anyone who has trouble breathing, or is unconscious,  incapacitated or otherwise unable to remove the mask without assistance. °· The cloth face cover is meant to protect other people in case you are infected. °· Do NOT use a facemask meant for a healthcare worker. °· Continue to keep about 6 feet between yourself and others. The cloth face cover is not a substitute for social distancing. °Cover coughs and sneezes °· If you are in a private setting and do not have on your cloth face covering, remember to always cover your mouth and nose with a tissue when you cough or sneeze or use the inside of your elbow. °· Throw used tissues in the trash. °· Immediately wash your hands with soap and water for at least 20 seconds. If soap and water are not readily available, clean your hands with a hand sanitizer that contains at least 60% alcohol. °Clean and disinfect °· Clean AND disinfect frequently touched surfaces daily. This includes tables, doorknobs, light switches, countertops, handles, desks, phones, keyboards, toilets, faucets, and sinks. www.cdc.gov/coronavirus/2019-ncov/prevent-getting-sick/disinfecting-your-home.html °· If surfaces are dirty, clean them: Use detergent or soap and water prior to disinfection. °· Then, use a household disinfectant. You can see a list of EPA-registered household disinfectants here. °cdc.gov/coronavirus °07/18/2018 °This information is not intended to replace advice given to you by your health care provider. Make sure you discuss any questions you have with your health care provider. °Document Released: 06/27/2018 Document Revised: 07/26/2018 Document Reviewed: 06/27/2018 °[employer] Patient Education © 2020 [employer] °COVID-19 Frequently Asked Questions °COVID-19 (coronavirus disease) is an infection that is caused by a large family of   viruses. Some viruses cause illness in people and others cause illness in animals like camels, cats, and bats. In some cases, the viruses that cause illness in animals can spread to humans. °Where did the  coronavirus come from? °In December 2019, [place] told the World Health Organization (WHO) of several cases of lung disease (human respiratory illness). These cases were linked to an open seafood and livestock market in the city of [place]. The link to the seafood and livestock market suggests that the virus may have spread from animals to humans. However, since that first outbreak in December, the virus has also been shown to spread from person to person. °What is the name of the disease and the virus? °Disease name °Early on, this disease was called novel coronavirus. This is because scientists determined that the disease was caused by a new (novel) respiratory virus. The World Health Organization (WHO) has now named the disease COVID-19, or coronavirus disease. °Virus name °The virus that causes the disease is called severe acute respiratory syndrome coronavirus 2 (SARS-CoV-2). °More information on disease and virus naming °World Health Organization (WHO): www.who.int/emergencies/diseases/novel-coronavirus-2019/technical-guidance/naming-the-coronavirus-disease-(covid-2019)-and-the-virus-that-causes-it °Who is at risk for complications from coronavirus disease? °Some people may be at higher risk for complications from coronavirus disease. This includes older adults and people who have chronic diseases, such as heart disease, diabetes, and lung disease. °If you are at higher risk for complications, take these extra precautions: °· Avoid close contact with people who are sick or have a fever or cough. Stay at least 3-6 ft (1-2 m) away from them, if possible. °· Wash your hands often with soap and water for at least 20 seconds. °· Avoid touching your face, mouth, nose, or eyes. °· Keep supplies on hand at home, such as food, medicine, and cleaning supplies. °· Stay home as much as possible. °· Avoid social gatherings and travel. °How does coronavirus disease spread? °The virus that causes coronavirus disease spreads  easily from person to person (is contagious). There are also cases of community-spread disease. This means the disease has spread to: °· People who have no known contact with other infected people. °· People who have not traveled to areas where there are known cases. °It appears to spread from one person to another through droplets from coughing or sneezing. °Can I get the virus from touching surfaces or objects? °There is still a lot that we do not know about the virus that causes coronavirus disease. Scientists are basing a lot of information on what they know about similar viruses, such as: °· Viruses cannot generally survive on surfaces for long. They need a human body (host) to survive. °· It is more likely that the virus is spread by close contact with people who are sick (direct contact), such as through: °? Shaking hands or hugging. °? Breathing in respiratory droplets that travel through the air. This can happen when an infected person coughs or sneezes on or near other people. °· It is less likely that the virus is spread when a person touches a surface or object that has the virus on it (indirect contact). The virus may be able to enter the body if the person touches a surface or object and then touches his or her face, eyes, nose, or mouth. °Can a person spread the virus without having symptoms of the disease? °It may be possible for the virus to spread before a person has symptoms of the disease, but this is most likely not the main way the virus is   spreading. It is more likely for the virus to spread by being in close contact with people who are sick and breathing in the respiratory droplets of a sick person's cough or sneeze. °What are the symptoms of coronavirus disease? °Symptoms vary from person to person and can range from mild to severe. Symptoms may include: °· Fever. °· Cough. °· Tiredness, weakness, or fatigue. °· Fast breathing or feeling short of breath. °These symptoms can appear anywhere  from 2 to 14 days after you have been exposed to the virus. If you develop symptoms, call your health care provider. People with severe symptoms may need hospital care. °If I am exposed to the virus, how long does it take before symptoms start? °Symptoms of coronavirus disease may appear anywhere from 2 to 14 days after a person has been exposed to the virus. If you develop symptoms, call your health care provider. °Should I be tested for this virus? °Your health care provider will decide whether to test you based on your symptoms, history of exposure, and your risk factors. °How does a health care provider test for this virus? °Health care providers will collect samples to send for testing. Samples may include: °· Taking a swab of fluid from the nose. °· Taking fluid from the lungs by having you cough up mucus (sputum) into a sterile cup. °· Taking a blood sample. °· Taking a stool or urine sample. °Is there a treatment or vaccine for this virus? °Currently, there is no vaccine to prevent coronavirus disease. Also, there are no medicines like antibiotics or antivirals to treat the virus. A person who becomes sick is given supportive care, which means rest and fluids. A person may also relieve his or her symptoms by using over-the-counter medicines that treat sneezing, coughing, and runny nose. These are the same medicines that a person takes for the common cold. °If you develop symptoms, call your health care provider. People with severe symptoms may need hospital care. °What can I do to protect myself and my family from this virus? ° °  ° °You can protect yourself and your family by taking the same actions that you would take to prevent the spread of other viruses. Take the following actions: °· Wash your hands often with soap and water for at least 20 seconds. If soap and water are not available, use alcohol-based hand sanitizer. °· Avoid touching your face, mouth, nose, or eyes. °· Cough or sneeze into a tissue,  sleeve, or elbow. Do not cough or sneeze into your hand or the air. °? If you cough or sneeze into a tissue, throw it away immediately and wash your hands. °· Disinfect objects and surfaces that you frequently touch every day. °· Avoid close contact with people who are sick or have a fever or cough. Stay at least 3-6 ft (1-2 m) away from them, if possible. °· Stay home if you are sick, except to get medical care. Call your health care provider before you get medical care. °· Make sure your vaccines are up to date. Ask your health care provider what vaccines you need. °What should I do if I need to travel? °Follow travel recommendations from your local health authority, the CDC, and WHO. °Travel information and advice °· Centers for Disease Control and Prevention (CDC): www.cdc.gov/coronavirus/2019-ncov/travelers/index.html °· World Health Organization (WHO): www.who.int/emergencies/diseases/novel-coronavirus-2019/travel-advice °Know the risks and take action to protect your health °· You are at higher risk of getting coronavirus disease if you are traveling to areas with   an outbreak or if you are exposed to travelers from areas with an outbreak. °· Wash your hands often and practice good hygiene to lower the risk of catching or spreading the virus. °What should I do if I am sick? °General instructions to stop the spread of infection °· Wash your hands often with soap and water for at least 20 seconds. If soap and water are not available, use alcohol-based hand sanitizer. °· Cough or sneeze into a tissue, sleeve, or elbow. Do not cough or sneeze into your hand or the air. °· If you cough or sneeze into a tissue, throw it away immediately and wash your hands. °· Stay home unless you must get medical care. Call your health care provider or local health authority before you get medical care. °· Avoid public areas. Do not take public transportation, if possible. °· If you can, wear a mask if you must go out of the house  or if you are in close contact with someone who is not sick. °Keep your home clean °· Disinfect objects and surfaces that are frequently touched every day. This may include: °? Counters and tables. °? Doorknobs and light switches. °? Sinks and faucets. °? Electronics such as phones, remote controls, keyboards, computers, and tablets. °· Wash dishes in hot, soapy water or use a dishwasher. Air-dry your dishes. °· Wash laundry in hot water. °Prevent infecting other household members °· Let healthy household members care for children and pets, if possible. If you have to care for children or pets, wash your hands often and wear a mask. °· Sleep in a different bedroom or bed, if possible. °· Do not share personal items, such as razors, toothbrushes, deodorant, combs, brushes, towels, and washcloths. °Where to find more information °Centers for Disease Control and Prevention (CDC) °· Information and news updates: www.cdc.gov/coronavirus/2019-ncov °World Health Organization (WHO) °· Information and news updates: www.who.int/emergencies/diseases/novel-coronavirus-2019 °· Coronavirus health topic: www.who.int/health-topics/coronavirus °· Questions and answers on COVID-19: www.who.int/news-room/q-a-detail/q-a-coronaviruses °· Global tracker: who.sprinklr.com °American Academy of Pediatrics (AAP) °· Information for families: www.healthychildren.org/English/health-issues/conditions/chest-lungs/Pages/2019-Novel-Coronavirus.aspx °The coronavirus situation is changing rapidly. Check your local health authority website or the CDC and WHO websites for updates and news. °When should I contact a health care provider? °· Contact your health care provider if you have symptoms of an infection, such as fever or cough, and you: °? Have been near anyone who is known to have coronavirus disease. °? Have come into contact with a person who is suspected to have coronavirus disease. °? Have traveled outside of the country. °When should I get  emergency medical care? °· Get help right away by calling your local emergency services (911 in the [place] if you have: °? Trouble breathing. °? Pain or pressure in your chest. °? Confusion. °? Blue-tinged lips and fingernails. °? Difficulty waking from sleep. °? Symptoms that get worse. °Let the emergency medical personnel know if you think you have coronavirus disease. °Summary °· A new respiratory virus is spreading from person to person and causing COVID-19 (coronavirus disease). °· The virus that causes COVID-19 appears to spread easily. It spreads from one person to another through droplets from coughing or sneezing. °· Older adults and those with chronic diseases are at higher risk of disease. If you are at higher risk for complications, take extra precautions. °· There is currently no vaccine to prevent coronavirus disease. There are no medicines, such as antibiotics or antivirals, to treat the virus. °· You can protect yourself and your family by washing your   hands often, avoiding touching your face, and covering your coughs and sneezes. °This information is not intended to replace advice given to you by your health care provider. Make sure you discuss any questions you have with your health care provider. °Document Released: 06/27/2018 Document Revised: 06/27/2018 Document Reviewed: 06/27/2018 °[employer] Patient Education © 2020 [employer] ° °

## 2018-12-24 NOTE — Discharge Summary (Addendum)
Physician Discharge Summary  Steven Bradley [id number] DOB: 07/12/1945 DOA: 12/21/2018  PCP: [facility]  Admit date: 12/21/2018 Discharge date: 12/24/2018  Admitted From: Home Disposition:  Home  Recommendations for Outpatient Follow-up:  1. Follow up with PCP in 1-2 weeks 2. Please obtain BMP/CBC in one week   Home Health:Yes Equipment/Devices:None  Discharge Condition:Stable CODE STATUS:Full Diet recommendation: Heart Healthy  Brief/Interim Summary: 73 y.o. male past medical history of GERD, essential hypertension PTSD comes into the hospital complaining of right knee hip and elbow pain denies any trauma or falls.  He started having fevers the day prior to admission.  Had a mild leukocytosis and a fever in admission he was admitted and started the work-up for sepsis which was unremarkable who SARS-CoV-2 test came back positive  Discharge Diagnoses:  Principal Problem:   Gout of multiple sites Active Problems:   Essential hypertension   Elevated LFTs   GERD (gastroesophageal reflux disease)   Macrocytosis   COVID-19 virus infection Sir[facility] likely due to gout flare: His procalcitonin was 0.5 at admission empiric antibiotics were DC'd, he did have a temperature 102.4, he denied any respiratory symptoms. His saturations throughout his hospital stay remained greater than 94% on room air and he expressed no respiratory difficulties.  He was started on oral ibuprofen and Protonix for GI prophylaxis blood cultures remain negative till date his leukocytosis was slowly improving. He will continue ibuprofen for 2 additional days as an outpatient.  COVID-19 viral infection: He relates no cough shortness of breath diarrhea nausea or vomiting. He remained asymptomatic after being monitored for 3 days in the hospital from a COVID-19 viral infection.  Mild elevation in LFTs:  Essential hypertension: Continue current home dose of Lasix no changes  made.  Hyperlipidemia: Resume Lipitor.     Discharge Instructions  Discharge Instructions    Diet - low sodium heart healthy   Complete by: As directed    MyChart COVID-19 home monitoring program   Complete by: Dec 24, 2018    Is the patient willing to use the [facility] for home monitoring?: Yes   Temperature monitoring   Complete by: Dec 24, 2018    After how many days would you like to receive a notification of this patient's flowsheet entries?: 1     Allergies as of 12/24/2018      Reactions   Allopurinol Rash   Vitamin D Analogs Rash      Medication List    STOP taking these medications   cefTRIAXone 2 g in dextrose 5 % 50 mL     TAKE these medications   acetaminophen 325 MG tablet Commonly known as: TYLENOL Take 2 tablets (650 mg total) by mouth every 6 (six) hours as needed for mild pain (or Fever >/= 101).   amLODipine 10 MG tablet Commonly known as: NORVASC Take 10 mg by mouth daily as needed (for BP >150).   atorvastatin 80 MG tablet Commonly known as: LIPITOR Take 40 mg by mouth at bedtime.   furosemide 40 MG tablet Commonly known as: LASIX Take 40 mg by mouth daily.   gabapentin 100 MG capsule Commonly known as: NEURONTIN Take 100 mg by mouth 2 (two) times daily.   HYDROcodone-acetaminophen 10-325 MG tablet Commonly known as: NORCO Take 1 tablet by mouth every 12 (twelve) hours as needed for moderate pain.   ibuprofen 200 MG tablet Commonly known as: ADVIL Take 2 tablets (400 mg total) by mouth every 6 (six) hours as needed  for fever or moderate pain. What changed: how much to take   ibuprofen 200 MG tablet Commonly known as: ADVIL Take 1 tablet (200 mg total) by mouth every 4 (four) hours as needed for fever or moderate pain. What changed: You were already taking a medication with the same name, and this prescription was added. Make sure you understand how and when to take each.   MAGnesium-Oxide 400 (241.3 Mg) MG  tablet Generic drug: magnesium oxide Take 400 mg by mouth daily.   multivitamin with minerals Tabs tablet Take 1 tablet by mouth daily.   omeprazole 20 MG capsule Commonly known as: PRILOSEC Take 2 capsules (40 mg total) by mouth 2 (two) times daily before a meal. What changed:   how much to take  when to take this  reasons to take this   predniSONE 5 MG tablet Commonly known as: DELTASONE Take 5 mg by mouth daily as needed (gout).   sertraline 100 MG tablet Commonly known as: ZOLOFT Take 100 mg by mouth daily.   traZODone 100 MG tablet Commonly known as: DESYREL Take 100 mg by mouth at bedtime.   Visine Advanced Relief 0.05-0.1-1-1 % Soln Generic drug: Tetrahydroz-Dextran-PEG-Povid Place 1 drop into both eyes daily as needed (for irritated eyes).       Allergies  Allergen Reactions  . Allopurinol Rash  . Vitamin D Analogs Rash    Consultations:  None   Procedures/Studies: Dg Chest Port 1 View  Result Date: 12/21/2018 CLINICAL DATA:  Shortness of breath. EXAM: PORTABLE CHEST 1 VIEW COMPARISON:  Radiograph June 01, 2016. FINDINGS: The heart size and mediastinal contours are within normal limits. Both lungs are clear. No pneumothorax or pleural effusion is noted. The visualized skeletal structures are unremarkable. IMPRESSION: No active disease. Electronically Signed   By: Marijo Conception M.D.   On: 12/21/2018 12:49    Subjective: No complains  Discharge Exam: Vitals:   12/24/18 0427 12/24/18 0737  BP: 136/83 (!) 155/88  Pulse: 74 68  Resp:  17  Temp: 97.6 F (36.4 C) 97.7 F (36.5 C)  SpO2: 100% 100%   Vitals:   12/23/18 1514 12/23/18 1953 12/24/18 0427 12/24/18 0737  BP: (!) 152/85 132/81 136/83 (!) 155/88  Pulse: 70 88 74 68  Resp: 18   17  Temp: (!) 97.5 F (36.4 C) 98.3 F (36.8 C) 97.6 F (36.4 C) 97.7 F (36.5 C)  TempSrc: Oral Oral Oral Oral  SpO2: 100% 96% 100% 100%  Weight:      Height:        General: Pt is alert, awake,  not in acute distress Cardiovascular: RRR, S1/S2 +, no rubs, no gallops Respiratory: CTA bilaterally, no wheezing, no rhonchi Abdominal: Soft, NT, ND, bowel sounds + Extremities: no edema, no cyanosis    The results of significant diagnostics from this hospitalization (including imaging, microbiology, ancillary and laboratory) are listed below for reference.     Microbiology: Recent Results (from the past 240 hour(s))  Blood Culture (routine x 2)     Status: None (Preliminary result)   Collection Time: 12/21/18 10:48 AM   Specimen: BLOOD  Result Value Ref Range Status   Specimen Description   Final    BLOOD RIGHT ANTECUBITAL Performed at [facility] [address], [place], [place] [address]    Special Requests   Final    BOTTLES DRAWN AEROBIC ONLY Blood Culture results may not be optimal due to an inadequate volume of blood received in  culture bottles Performed at [facility] [address] [place], [place] [address]    Culture   Final    NO GROWTH 2 DAYS Performed at [facility] [address] [place], [place] [address]    Report Status PENDING  Incomplete  SARS Coronavirus 2 by RT PCR (hospital order, performed in [facility] hospital lab) Nasopharyngeal Nasopharyngeal Swab     Status: Abnormal   Collection Time: 12/21/18 10:50 AM   Specimen: Nasopharyngeal Swab  Result Value Ref Range Status   SARS Coronavirus 2 POSITIVE (A) NEGATIVE Final    Comment: RESULT CALLED TO, READ BACK BY AND VERIFIED WITH: N.MOSCINSKI AT [id number] ON 12/21/18 BY N.THOMPSON (NOTE) If result is NEGATIVE SARS-CoV-2 target nucleic acids are NOT DETECTED. The SARS-CoV-2 RNA is generally detectable in upper and lower  respiratory specimens during the acute phase of infection. The lowest  concentration of SARS-CoV-2 viral copies this assay can detect is 250  copies / mL. A negative result does not preclude SARS-CoV-2 infection  and should not be used as  the sole basis for treatment or other  patient management decisions.  A negative result may occur with  improper specimen collection / handling, submission of specimen other  than nasopharyngeal swab, presence of viral mutation(s) within the  areas targeted by this assay, and inadequate number of viral copies  (<250 copies / mL). A negative result must be combined with clinical  observations, patient history, and epidemiological information. If result is POSITIVE SARS-CoV-2 target nucleic acids are DET ECTED. The SARS-CoV-2 RNA is generally detectable in upper and lower  respiratory specimens during the acute phase of infection.  Positive  results are indicative of active infection with SARS-CoV-2.  Clinical  correlation with patient history and other diagnostic information is  necessary to determine patient infection status.  Positive results do  not rule out bacterial infection or co-infection with other viruses. If result is PRESUMPTIVE POSTIVE SARS-CoV-2 nucleic acids MAY BE PRESENT.   A presumptive positive result was obtained on the submitted specimen  and confirmed on repeat testing.  While 2019 novel coronavirus  (SARS-CoV-2) nucleic acids may be present in the submitted sample  additional confirmatory testing may be necessary for epidemiological  and / or clinical management purposes  to differentiate between  SARS-CoV-2 and other Sarbecovirus currently known to infect humans.  If clinically indicated additional testing with an alternate test  methodology ([place]) is advised. The SARS-CoV-2 RNA is generally  detectable in upper and lower respiratory specimens during the acute  phase of infection. The expected result is Negative. Fact Sheet for Patients:  StrictlyIdeas.no Fact Sheet for Healthcare Providers: BankingDealers.co.za This test is not yet approved or cleared by the [place] FDA and has been authorized for  detection and/or diagnosis of SARS-CoV-2 by FDA under an Emergency Use Authorization (EUA).  This EUA will remain in effect (meaning this test can be used) for the duration of the COVID-19 declaration under Section 564(b)(1) of the Act, 21 U.S.C. section 360bbb-3(b)(1), unless the authorization is terminated or revoked sooner. Performed at [facility], [place] [address], [place],  [address]   Blood Culture (routine x 2)     Status: None (Preliminary result)   Collection Time: 12/21/18 11:50 AM   Specimen: BLOOD RIGHT FOREARM  Result Value Ref Range Status   Specimen Description   Final    BLOOD RIGHT FOREARM Performed at [facility] [address] [place],  [place] [address]    Special Requests   Final    BOTTLES DRAWN AEROBIC AND ANAEROBIC Blood Culture adequate volume Performed at [facility] [address], [place], [place] [address]    Culture   Final    NO GROWTH 2 DAYS Performed at [facility] [address] [place], [place] [address]    Report Status PENDING  Incomplete  Urine culture     Status: None   Collection Time: 12/21/18  3:11 PM   Specimen: In/Out Cath Urine  Result Value Ref Range Status   Specimen Description   Final    IN/OUT CATH URINE Performed at [facility] [address] [place], [place] [address]    Special Requests   Final    NONE Performed at [facility], [place] [address] [place], [place] [address]    Culture   Final    NO GROWTH Performed at [facility], [place] [address], [place], [place] [address]    Report Status 12/22/2018 FINAL  Final     Labs: BNP (last 3 results) No results for input(s): BNP in the last 8760 hours. Basic Metabolic Panel: Recent Labs  Lab 12/21/18 1051 12/23/18 0318 12/24/18 0058  NA 133* 132* 135  K 3.6 3.4* 3.9  CL 94* 100 102  CO2 23 21* 23  GLUCOSE 136* 117* 90  BUN 13 18 18    CREATININE 0.91 0.66 0.70  CALCIUM 9.0 8.6* 8.7*  MG  --  1.1* 1.2*   Liver Function Tests: Recent Labs  Lab 12/21/18 1051 12/23/18 0318 12/24/18 0058  AST 63* 45* 45*  ALT 31 22 23   ALKPHOS 132* 114 104  BILITOT 4.0* 1.5* 1.5*  PROT 9.0* 7.1 6.8  ALBUMIN 3.6 2.7* 2.7*   No results for input(s): LIPASE, AMYLASE in the last 168 hours. No results for input(s): AMMONIA in the last 168 hours. CBC: Recent Labs  Lab 12/21/18 1051 12/23/18 0318 12/24/18 0058  WBC 15.6* 9.8 8.9  NEUTROABS 13.4* 8.2* 6.4  HGB 13.1 10.4* 10.4*  HCT 38.5* 30.3* 30.5*  MCV 106.6* 104.5* 105.2*  PLT 182 129* 152   Cardiac Enzymes: No results for input(s): CKTOTAL, CKMB, CKMBINDEX, TROPONINI in the last 168 hours. BNP: Invalid input(s): POCBNP CBG: No results for input(s): GLUCAP in the last 168 hours. D-Dimer Recent Labs    12/23/18 0318 12/24/18 0058  DDIMER 2.80* 3.35*   Hgb A1c No results for input(s): HGBA1C in the last 72 hours. Lipid Profile No results for input(s): CHOL, HDL, LDLCALC, TRIG, CHOLHDL, LDLDIRECT in the last 72 hours. Thyroid function studies No results for input(s): TSH, T4TOTAL, T3FREE, THYROIDAB in the last 72 hours.  Invalid input(s): FREET3 Anemia work up Recent Labs    12/23/18 0318 12/24/18 0058  FERRITIN 669* 593*   Urinalysis    Component Value Date/Time   COLORURINE AMBER (A) 12/21/2018 1511   APPEARANCEUR HAZY (A) 12/21/2018 1511   LABSPEC 1.026 12/21/2018 1511   PHURINE 6.0 12/21/2018 1511   GLUCOSEU NEGATIVE 12/21/2018 1511   HGBUR NEGATIVE 12/21/2018 1511   BILIRUBINUR SMALL (A) 12/21/2018 1511   KETONESUR 5 (A) 12/21/2018 1511   PROTEINUR 100 (A) 12/21/2018 1511   UROBILINOGEN 2.0 (H) 03/28/2014 1624   NITRITE NEGATIVE 12/21/2018 1511   LEUKOCYTESUR NEGATIVE 12/21/2018 1511   Sepsis Labs Invalid input(s): PROCALCITONIN,  WBC,  LACTICIDVEN Microbiology Recent Results (from the past 240 hour(s))  Blood Culture (routine x 2)  Status: None (Preliminary result)   Collection Time: 12/21/18 10:48 AM   Specimen: BLOOD  Result Value Ref Range Status   Specimen Description   Final    BLOOD RIGHT ANTECUBITAL Performed at [facility] [address], [place], [place] [address]    Special Requests   Final    BOTTLES DRAWN AEROBIC ONLY Blood Culture results may not be optimal due to an inadequate volume of blood received in culture bottles Performed at [facility] [address], [place], [place] [address]    Culture   Final    NO GROWTH 2 DAYS Performed at [facility] [address] [place], [place] [address]    Report Status PENDING  Incomplete  SARS Coronavirus 2 by RT PCR (hospital order, performed in [facility] hospital lab) Nasopharyngeal Nasopharyngeal Swab     Status: Abnormal   Collection Time: 12/21/18 10:50 AM   Specimen: Nasopharyngeal Swab  Result Value Ref Range Status   SARS Coronavirus 2 POSITIVE (A) NEGATIVE Final    Comment: RESULT CALLED TO, READ BACK BY AND VERIFIED WITH: N.MOSCINSKI AT [id number] ON 12/21/18 BY N.THOMPSON (NOTE) If result is NEGATIVE SARS-CoV-2 target nucleic acids are NOT DETECTED. The SARS-CoV-2 RNA is generally detectable in upper and lower  respiratory specimens during the acute phase of infection. The lowest  concentration of SARS-CoV-2 viral copies this assay can detect is 250  copies / mL. A negative result does not preclude SARS-CoV-2 infection  and should not be used as the sole basis for treatment or other  patient management decisions.  A negative result may occur with  improper specimen collection / handling, submission of specimen other  than nasopharyngeal swab, presence of viral mutation(s) within the  areas targeted by this assay, and inadequate number of viral copies  (<250 copies / mL). A negative result must be combined with clinical  observations, patient history, and epidemiological information. If result is  POSITIVE SARS-CoV-2 target nucleic acids are DET ECTED. The SARS-CoV-2 RNA is generally detectable in upper and lower  respiratory specimens during the acute phase of infection.  Positive  results are indicative of active infection with SARS-CoV-2.  Clinical  correlation with patient history and other diagnostic information is  necessary to determine patient infection status.  Positive results do  not rule out bacterial infection or co-infection with other viruses. If result is PRESUMPTIVE POSTIVE SARS-CoV-2 nucleic acids MAY BE PRESENT.   A presumptive positive result was obtained on the submitted specimen  and confirmed on repeat testing.  While 2019 novel coronavirus  (SARS-CoV-2) nucleic acids may be present in the submitted sample  additional confirmatory testing may be necessary for epidemiological  and / or clinical management purposes  to differentiate between  SARS-CoV-2 and other Sarbecovirus currently known to infect humans.  If clinically indicated additional testing with an alternate test  methodology ([place]) is advised. The SARS-CoV-2 RNA is generally  detectable in upper and lower respiratory specimens during the acute  phase of infection. The expected result is Negative. Fact Sheet for Patients:  StrictlyIdeas.no Fact Sheet for Healthcare Providers: BankingDealers.co.za This test is not yet approved or cleared by the [place] FDA and has been authorized for detection and/or diagnosis of SARS-CoV-2 by FDA under an Emergency Use Authorization (EUA).  This EUA will remain in effect (meaning this test can be used) for the duration of the COVID-19 declaration under Section 564(b)(1) of the Act, 21 U.S.C. section 360bbb-3(b)(1), unless the  authorization is terminated or revoked sooner. Performed at [facility], [place] [address], [place], [place] [address]   Blood Culture (routine x 2)     Status:  None (Preliminary result)   Collection Time: 12/21/18 11:50 AM   Specimen: BLOOD RIGHT FOREARM  Result Value Ref Range Status   Specimen Description   Final    BLOOD RIGHT FOREARM Performed at [facility] [address], [place], [place] [address]    Special Requests   Final    BOTTLES DRAWN AEROBIC AND ANAEROBIC Blood Culture adequate volume Performed at [facility] [address] [place], [place] [address]    Culture   Final    NO GROWTH 2 DAYS Performed at [facility] [address] [place], [place] [address]    Report Status PENDING  Incomplete  Urine culture     Status: None   Collection Time: 12/21/18  3:11 PM   Specimen: In/Out Cath Urine  Result Value Ref Range Status   Specimen Description   Final    IN/OUT CATH URINE Performed at [facility] [address], [place], [place] [address]    Special Requests   Final    NONE Performed at [facility], [place] [address] [place], [place] [address]    Culture   Final    NO GROWTH Performed at [facility], [place] [address], [place], [place] [address]    Report Status 12/22/2018 FINAL  Final     Time coordinating discharge: Over 40 minutes  SIGNED:   [name], MD  [facility] 12/24/2018, 9:45 AM Pager   If 7PM-7AM, please contact night-coverage [email or web address] Password [email or web address]

## 2018-12-24 NOTE — TOC Transition Note (Signed)
Transition of Care [facility]) - CM/SW Discharge Note   Patient Details  Name: Steven Bradley MRN: [id number] Date of Birth: May 17, 1945  [facility] CM/SW Contact:  Ninfa Meeker, RN Phone Number: 12/24/2018, 11:47 AM   Clinical Narrative:  Patient is a 73 y.o.malepast medical history of GERD, essential hypertension PTSD comes into the hospital complaining of right knee hip and elbow pain denies any trauma or falls. He started having fevers the day before admission. Tested COVID Postive. Patient has improved and will discharge home today. No needs identified. Home with spouse.          Patient Goals and CMS Choice        Discharge Placement                       Discharge Plan and Services                                     Social Determinants of Health (SDOH) Interventions     Readmission Risk Interventions No flowsheet data found.

## 2018-12-24 NOTE — Plan of Care (Signed)
  Problem: Education: Goal: Knowledge of risk factors and measures for prevention of condition will improve Outcome: Adequate for Discharge   Problem: Coping: Goal: Psychosocial and spiritual needs will be supported Outcome: Adequate for Discharge   Problem: Respiratory: Goal: Will maintain a patent airway Outcome: Adequate for Discharge Goal: Complications related to the disease process, condition or treatment will be avoided or minimized Outcome: Adequate for Discharge   Problem: Education: Goal: Knowledge of General Education information will improve Description: Including pain rating scale, medication(s)/side effects and non-pharmacologic comfort measures Outcome: Adequate for Discharge   Problem: Health Behavior/Discharge Planning: Goal: Ability to manage health-related needs will improve Outcome: Adequate for Discharge   Problem: Clinical Measurements: Goal: Ability to maintain clinical measurements within normal limits will improve Outcome: Adequate for Discharge Goal: Will remain free from infection Outcome: Adequate for Discharge Goal: Diagnostic test results will improve Outcome: Adequate for Discharge Goal: Respiratory complications will improve Outcome: Adequate for Discharge Goal: Cardiovascular complication will be avoided Outcome: Adequate for Discharge   Problem: Activity: Goal: Risk for activity intolerance will decrease Outcome: Adequate for Discharge   Problem: Nutrition: Goal: Adequate nutrition will be maintained Outcome: Adequate for Discharge   Problem: Coping: Goal: Level of anxiety will decrease Outcome: Adequate for Discharge   Problem: Elimination: Goal: Will not experience complications related to bowel motility Outcome: Adequate for Discharge Goal: Will not experience complications related to urinary retention Outcome: Adequate for Discharge   Problem: Pain Managment: Goal: General experience of comfort will improve Outcome: Adequate  for Discharge   Problem: Safety: Goal: Ability to remain free from injury will improve Outcome: Adequate for Discharge   Problem: Skin Integrity: Goal: Risk for impaired skin integrity will decrease Outcome: Adequate for Discharge   Problem: Acute Rehab PT Goals(only PT should resolve) Goal: Pt Will Go Sit To Supine/Side Outcome: Adequate for Discharge Goal: Patient Will Transfer Sit To/From Stand Outcome: Adequate for Discharge Goal: Pt Will Perform Standing Balance Or Pre-Gait Outcome: Adequate for Discharge Goal: Pt Will Ambulate Outcome: Adequate for Discharge Goal: Pt/caregiver will Perform Home Exercise Program Outcome: Adequate for Discharge

## 2018-12-24 NOTE — Plan of Care (Signed)
Will continue with plan of care. 

## 2018-12-26 LAB — CULTURE, BLOOD (ROUTINE X 2)
Culture: NO GROWTH
Culture: NO GROWTH
Special Requests: ADEQUATE

## 2019-01-19 ENCOUNTER — Other Ambulatory Visit: Payer: Self-pay | Admitting: Internal Medicine

## 2019-05-15 ENCOUNTER — Other Ambulatory Visit: Payer: Self-pay

## 2019-05-15 ENCOUNTER — Emergency Department (HOSPITAL_COMMUNITY): Payer: No Typology Code available for payment source

## 2019-05-15 ENCOUNTER — Inpatient Hospital Stay (HOSPITAL_COMMUNITY)
Admission: EM | Admit: 2019-05-15 | Discharge: 2019-05-18 | DRG: 315 | Disposition: A | Discharge status: Home / Self Care | Payer: No Typology Code available for payment source | Attending: Internal Medicine | Admitting: Internal Medicine

## 2019-05-15 DIAGNOSIS — Z9889 Other specified postprocedural states: Secondary | ICD-10-CM

## 2019-05-15 DIAGNOSIS — N390 Urinary tract infection, site not specified: Secondary | ICD-10-CM | POA: Diagnosis present

## 2019-05-15 DIAGNOSIS — E876 Hypokalemia: Secondary | ICD-10-CM | POA: Diagnosis present

## 2019-05-15 DIAGNOSIS — I129 Hypertensive chronic kidney disease with stage 1 through stage 4 chronic kidney disease, or unspecified chronic kidney disease: Secondary | ICD-10-CM | POA: Diagnosis present

## 2019-05-15 DIAGNOSIS — E559 Vitamin D deficiency, unspecified: Secondary | ICD-10-CM | POA: Diagnosis present

## 2019-05-15 DIAGNOSIS — Z8261 Family history of arthritis: Secondary | ICD-10-CM

## 2019-05-15 DIAGNOSIS — E861 Hypovolemia: Secondary | ICD-10-CM | POA: Diagnosis present

## 2019-05-15 DIAGNOSIS — T380X5A Adverse effect of glucocorticoids and synthetic analogues, initial encounter: Secondary | ICD-10-CM | POA: Diagnosis present

## 2019-05-15 DIAGNOSIS — I959 Hypotension, unspecified: Secondary | ICD-10-CM | POA: Diagnosis not present

## 2019-05-15 DIAGNOSIS — Z888 Allergy status to other drugs, medicaments and biological substances status: Secondary | ICD-10-CM

## 2019-05-15 DIAGNOSIS — K219 Gastro-esophageal reflux disease without esophagitis: Secondary | ICD-10-CM | POA: Diagnosis present

## 2019-05-15 DIAGNOSIS — E1122 Type 2 diabetes mellitus with diabetic chronic kidney disease: Secondary | ICD-10-CM | POA: Diagnosis present

## 2019-05-15 DIAGNOSIS — I10 Essential (primary) hypertension: Secondary | ICD-10-CM | POA: Diagnosis present

## 2019-05-15 DIAGNOSIS — N181 Chronic kidney disease, stage 1: Secondary | ICD-10-CM | POA: Diagnosis present

## 2019-05-15 DIAGNOSIS — E871 Hypo-osmolality and hyponatremia: Secondary | ICD-10-CM | POA: Diagnosis present

## 2019-05-15 DIAGNOSIS — Z91040 Latex allergy status: Secondary | ICD-10-CM

## 2019-05-15 DIAGNOSIS — R000 Tachycardia, unspecified: Secondary | ICD-10-CM | POA: Diagnosis present

## 2019-05-15 DIAGNOSIS — Z91018 Allergy to other foods: Secondary | ICD-10-CM

## 2019-05-15 DIAGNOSIS — F4310 Post-traumatic stress disorder, unspecified: Secondary | ICD-10-CM | POA: Diagnosis present

## 2019-05-15 DIAGNOSIS — D72819 Decreased white blood cell count, unspecified: Secondary | ICD-10-CM | POA: Diagnosis present

## 2019-05-15 DIAGNOSIS — Z885 Allergy status to narcotic agent status: Secondary | ICD-10-CM

## 2019-05-15 DIAGNOSIS — N400 Enlarged prostate without lower urinary tract symptoms: Secondary | ICD-10-CM | POA: Diagnosis present

## 2019-05-15 DIAGNOSIS — Z803 Family history of malignant neoplasm of breast: Secondary | ICD-10-CM

## 2019-05-15 DIAGNOSIS — Z800 Family history of malignant neoplasm of digestive organs: Secondary | ICD-10-CM

## 2019-05-15 DIAGNOSIS — Z7982 Long term (current) use of aspirin: Secondary | ICD-10-CM

## 2019-05-15 DIAGNOSIS — Z79899 Other long term (current) drug therapy: Secondary | ICD-10-CM

## 2019-05-15 DIAGNOSIS — M1990 Unspecified osteoarthritis, unspecified site: Secondary | ICD-10-CM | POA: Diagnosis present

## 2019-05-15 DIAGNOSIS — B9689 Other specified bacterial agents as the cause of diseases classified elsewhere: Secondary | ICD-10-CM | POA: Diagnosis present

## 2019-05-15 DIAGNOSIS — R310 Gross hematuria: Secondary | ICD-10-CM | POA: Diagnosis present

## 2019-05-15 DIAGNOSIS — D539 Nutritional anemia, unspecified: Secondary | ICD-10-CM | POA: Diagnosis present

## 2019-05-15 DIAGNOSIS — M109 Gout, unspecified: Secondary | ICD-10-CM | POA: Diagnosis present

## 2019-05-15 DIAGNOSIS — Z8249 Family history of ischemic heart disease and other diseases of the circulatory system: Secondary | ICD-10-CM

## 2019-05-15 DIAGNOSIS — Z887 Allergy status to serum and vaccine status: Secondary | ICD-10-CM

## 2019-05-15 DIAGNOSIS — Z801 Family history of malignant neoplasm of trachea, bronchus and lung: Secondary | ICD-10-CM

## 2019-05-15 DIAGNOSIS — D696 Thrombocytopenia, unspecified: Secondary | ICD-10-CM | POA: Diagnosis present

## 2019-05-15 DIAGNOSIS — E785 Hyperlipidemia, unspecified: Secondary | ICD-10-CM | POA: Diagnosis present

## 2019-05-15 DIAGNOSIS — E872 Acidosis, unspecified: Secondary | ICD-10-CM

## 2019-05-15 DIAGNOSIS — Z8546 Personal history of malignant neoplasm of prostate: Secondary | ICD-10-CM

## 2019-05-15 DIAGNOSIS — Z20822 Contact with and (suspected) exposure to covid-19: Secondary | ICD-10-CM | POA: Diagnosis present

## 2019-05-15 LAB — CBC WITH DIFFERENTIAL/PLATELET
Abs Immature Granulocytes: 0.01 10*3/uL (ref 0.00–0.07)
Basophils Absolute: 0 10*3/uL (ref 0.0–0.1)
Basophils Relative: 1 %
Eosinophils Absolute: 0 10*3/uL (ref 0.0–0.5)
Eosinophils Relative: 1 %
HCT: 38.1 % — ABNORMAL LOW (ref 39.0–52.0)
Hemoglobin: 13.1 g/dL (ref 13.0–17.0)
Immature Granulocytes: 1 %
Lymphocytes Relative: 7 %
Lymphs Abs: 0.1 10*3/uL — ABNORMAL LOW (ref 0.7–4.0)
MCH: 36.2 pg — ABNORMAL HIGH (ref 26.0–34.0)
MCHC: 34.4 g/dL (ref 30.0–36.0)
MCV: 105.2 fL — ABNORMAL HIGH (ref 80.0–100.0)
Monocytes Absolute: 0 10*3/uL — ABNORMAL LOW (ref 0.1–1.0)
Monocytes Relative: 1 %
Neutro Abs: 1.8 10*3/uL (ref 1.7–7.7)
Neutrophils Relative %: 89 %
Platelets: 134 10*3/uL — ABNORMAL LOW (ref 150–400)
RBC: 3.62 MIL/uL — ABNORMAL LOW (ref 4.22–5.81)
RDW: 13.3 % (ref 11.5–15.5)
WBC: 2 10*3/uL — ABNORMAL LOW (ref 4.0–10.5)
nRBC: 3.1 % — ABNORMAL HIGH (ref 0.0–0.2)

## 2019-05-15 LAB — COMPREHENSIVE METABOLIC PANEL
ALT: 24 U/L (ref 0–44)
AST: 64 U/L — ABNORMAL HIGH (ref 15–41)
Albumin: 3.9 g/dL (ref 3.5–5.0)
Alkaline Phosphatase: 104 U/L (ref 38–126)
Anion gap: 19 — ABNORMAL HIGH (ref 5–15)
BUN: 24 mg/dL — ABNORMAL HIGH (ref 8–23)
CO2: 19 mmol/L — ABNORMAL LOW (ref 22–32)
Calcium: 8.6 mg/dL — ABNORMAL LOW (ref 8.9–10.3)
Chloride: 98 mmol/L (ref 98–111)
Creatinine, Ser: 1.13 mg/dL (ref 0.61–1.24)
GFR calc Af Amer: >60 mL/min (ref >60)
GFR calc non Af Amer: >60 mL/min (ref >60)
Glucose, Bld: 105 mg/dL — ABNORMAL HIGH (ref 70–99)
Potassium: 2.7 mmol/L — CL (ref 3.5–5.1)
Sodium: 136 mmol/L (ref 135–145)
Total Bilirubin: 1.5 mg/dL — ABNORMAL HIGH (ref 0.3–1.2)
Total Protein: 8.5 g/dL — ABNORMAL HIGH (ref 6.5–8.1)

## 2019-05-15 LAB — CBC
HCT: 36.7 % — ABNORMAL LOW (ref 39.0–52.0)
Hemoglobin: 12.1 g/dL — ABNORMAL LOW (ref 13.0–17.0)
MCH: 36.3 pg — ABNORMAL HIGH (ref 26.0–34.0)
MCHC: 33 g/dL (ref 30.0–36.0)
MCV: 110.2 fL — ABNORMAL HIGH (ref 80.0–100.0)
Platelets: 108 10*3/uL — ABNORMAL LOW (ref 150–400)
RBC: 3.33 MIL/uL — ABNORMAL LOW (ref 4.22–5.81)
RDW: 13.7 % (ref 11.5–15.5)
WBC: 10.5 10*3/uL (ref 4.0–10.5)
nRBC: 0 % (ref 0.0–0.2)

## 2019-05-15 LAB — TYPE AND SCREEN
ABO/RH(D): O POS
Antibody Screen: NEGATIVE

## 2019-05-15 LAB — PROTIME-INR
INR: 1.1 (ref 0.8–1.2)
Prothrombin Time: 14.1 seconds (ref 11.4–15.2)

## 2019-05-15 LAB — TROPONIN I (HIGH SENSITIVITY)
Troponin I (High Sensitivity): 5 ng/L (ref <18)
Troponin I (High Sensitivity): 5 ng/L (ref <18)

## 2019-05-15 LAB — APTT: aPTT: 25 seconds (ref 24–36)

## 2019-05-15 LAB — LACTIC ACID, PLASMA: Lactic Acid, Venous: 5.1 mmol/L (ref 0.5–1.9)

## 2019-05-15 MED ORDER — POTASSIUM CHLORIDE IN NACL 20-0.45 MEQ/L-% IV SOLN
INTRAVENOUS | Status: DC
  Filled 2019-05-15 (×3): qty 1000

## 2019-05-15 MED ORDER — TRAZODONE HCL 100 MG PO TABS
100.0000 mg | ORAL_TABLET | Freq: Every day | ORAL | Status: DC
  Administered 2019-05-15 – 2019-05-17 (×3): 100 mg via ORAL
  Filled 2019-05-15 (×3): qty 1

## 2019-05-15 MED ORDER — ACETAMINOPHEN 650 MG RE SUPP: 650.0000 mg | Freq: Four times a day (QID) | RECTAL | Status: DC | PRN

## 2019-05-15 MED ORDER — ATORVASTATIN CALCIUM 40 MG PO TABS
40.0000 mg | ORAL_TABLET | Freq: Every day | ORAL | Status: DC
  Administered 2019-05-15 – 2019-05-17 (×3): 40 mg via ORAL
  Filled 2019-05-15 (×3): qty 1

## 2019-05-15 MED ORDER — METOCLOPRAMIDE HCL 5 MG/ML IJ SOLN
10.0000 mg | Freq: Once | INTRAMUSCULAR | Status: AC
  Administered 2019-05-15: 10 mg via INTRAVENOUS
  Filled 2019-05-15: qty 2

## 2019-05-15 MED ORDER — HYDROCODONE-ACETAMINOPHEN 10-325 MG PO TABS
1.0000 | ORAL_TABLET | Freq: Two times a day (BID) | ORAL | Status: DC | PRN
  Administered 2019-05-16: 1 via ORAL
  Filled 2019-05-15: qty 1

## 2019-05-15 MED ORDER — ACETAMINOPHEN 325 MG PO TABS: 650.0000 mg | ORAL_TABLET | Freq: Four times a day (QID) | ORAL | Status: DC | PRN

## 2019-05-15 MED ORDER — SODIUM CHLORIDE 0.9 % IV BOLUS
1000.0000 mL | Freq: Once | INTRAVENOUS | Status: AC
  Administered 2019-05-15: 1000 mL via INTRAVENOUS

## 2019-05-15 MED ORDER — POTASSIUM CHLORIDE 10 MEQ/100ML IV SOLN
10.0000 meq | INTRAVENOUS | Status: AC
  Administered 2019-05-15 (×3): 10 meq via INTRAVENOUS
  Filled 2019-05-15 (×3): qty 100

## 2019-05-15 MED ORDER — GABAPENTIN 100 MG PO CAPS
100.0000 mg | ORAL_CAPSULE | Freq: Two times a day (BID) | ORAL | Status: DC
  Administered 2019-05-15 – 2019-05-18 (×6): 100 mg via ORAL
  Filled 2019-05-15 (×6): qty 1

## 2019-05-15 MED ORDER — POLYETHYLENE GLYCOL 3350 17 G PO PACK: 17.0000 g | PACK | Freq: Every day | ORAL | Status: DC | PRN

## 2019-05-15 MED ORDER — ADULT MULTIVITAMIN W/MINERALS CH
1.0000 | ORAL_TABLET | Freq: Every day | ORAL | Status: DC
  Administered 2019-05-15 – 2019-05-18 (×4): 1 via ORAL
  Filled 2019-05-15 (×4): qty 1

## 2019-05-15 MED ORDER — PANTOPRAZOLE SODIUM 40 MG PO TBEC
40.0000 mg | DELAYED_RELEASE_TABLET | Freq: Every day | ORAL | Status: DC
  Administered 2019-05-15 – 2019-05-18 (×4): 40 mg via ORAL
  Filled 2019-05-15 (×4): qty 1

## 2019-05-15 MED ORDER — SERTRALINE HCL 100 MG PO TABS
100.0000 mg | ORAL_TABLET | Freq: Every day | ORAL | Status: DC
  Administered 2019-05-15 – 2019-05-18 (×4): 100 mg via ORAL
  Filled 2019-05-15 (×4): qty 1

## 2019-05-15 MED ORDER — ONDANSETRON HCL 4 MG PO TABS: 4.0000 mg | ORAL_TABLET | Freq: Four times a day (QID) | ORAL | Status: DC | PRN

## 2019-05-15 MED ORDER — HYDROCORTISONE NA SUCCINATE PF 100 MG IJ SOLR
50.0000 mg | Freq: Three times a day (TID) | INTRAMUSCULAR | Status: DC
  Administered 2019-05-15 – 2019-05-16 (×2): 50 mg via INTRAVENOUS
  Filled 2019-05-15 (×2): qty 2

## 2019-05-15 MED ORDER — NAPHAZOLINE-GLYCERIN 0.012-0.2 % OP SOLN
1.0000 [drp] | Freq: Four times a day (QID) | OPHTHALMIC | Status: DC | PRN
  Filled 2019-05-15: qty 15

## 2019-05-15 MED ORDER — HYDROCODONE-ACETAMINOPHEN 5-325 MG PO TABS: 1.0000 | ORAL_TABLET | ORAL | Status: DC | PRN

## 2019-05-15 MED ORDER — ONDANSETRON HCL 4 MG/2ML IJ SOLN: 4.0000 mg | Freq: Four times a day (QID) | INTRAMUSCULAR | Status: DC | PRN

## 2019-05-15 NOTE — ED Notes (Signed)
Provider at bedside

## 2019-05-15 NOTE — ED Notes (Signed)
BP 99/71 MD aware

## 2019-05-15 NOTE — H&P (Deleted)
[facility] History & Physical  [place] 05/15/2019  Chief Complaint:   HPI: The patient is a 74 y.o. year-old w/ hx of HTN, syncope, HL, gout, DJD and prostate cancer who was at the [place] earlier today for a bladder procedure "to remove some scar tissue" and was dc'd home afterwards. When he got home he was very weak and nauseated so was brought to the ED.  In ED pt c/o dizziness. BP's were low in the 90's then dropped to the 60's. Pt got 2L NS bolus w/ sig improvement in BP's. Heart rates were 110 then 140's, but now are coming down again to 115. RA sat 97%.  No fevers, chills, cough or SOB.  Asked to see for admission.   Pt seen in [place] room.  Not feeling any worse, maybe a bit better.  Still feels poorly. No abd pain, CP or SOB.  HD 118, BP now 126/ 65.  Patient states he had prostate "completely removed" 4 yrs ago for prostate ca and that it has not recurred.  He was having voiding problems off and on and they did the procedure at the [place] today to "remove scar tissue" to improve his voiding. Did not have any hematuria pre-op. Not on any blood thinners, asa or plavix.    ROS  denies CP  no joint pain   no HA  no blurry vision  no rash  no diarrhea       Past Medical History  Past Medical History:  Diagnosis Date  . Allergy   . Arthritis    neck  . Cataract    bilateral - MD monitoring cataracts  . CHF (congestive heart failure) ([place])   . Chronic kidney disease, stage I    DR OTTELIN  HX UTIS  . Cirrhosis ([place])   . Cramp of limb   . Diabetes mellitus   . Dysphagia, unspecified(787.20)   . Dysuria   . Epistaxis   . GERD (gastroesophageal reflux disease)   . Heart murmur    NO CARDIOLOGIST  DX FOR YEARS ASYMPTOMATIC  . Lumbago   . Neoplasm of uncertain behavior of skin   . Nonspecific elevation of levels of transaminase or lactic acid dehydrogenase (LDH)   . Osteoarthrosis, unspecified whether generalized or localized, unspecified site   .  Other and unspecified hyperlipidemia    diet controlled  . Pain in joint, shoulder region   . Paresthesias 02/05/2015  . Postablative ovarian failure   . Trochanteric bursitis of left hip 10/21/2015  . Type 2 diabetes mellitus without complication ([place])   . Unspecified essential hypertension    no meds   Past Surgical History  Past Surgical History:  Procedure Laterality Date  . BREAST BIOPSY    . CARDIAC CATHETERIZATION N/A 12/03/2015   Procedure: Left Heart Cath and Coronary Angiography;  Surgeon: Belva Crome, MD;  Location: [place] [facility];  Service: Cardiovascular;  Laterality: N/A;  . COLONOSCOPY  2012   Dr Lajoyce Corners.   . COLONOSCOPY WITH PROPOFOL N/A 05/13/2016   Procedure: COLONOSCOPY WITH PROPOFOL;  Surgeon: Milus Banister, MD;  Location: [facility];  Service: Endoscopy;  Laterality: N/A;  . CORONARY ARTERY BYPASS GRAFT N/A 12/04/2015   Procedure: CORONARY ARTERY BYPASS GRAFTING (CABG) x 3 USING RIGHT LEG GREATER SAPHENOUS VEIN GRAFT;  Surgeon: Melrose Nakayama, MD;  Location: [place];  Service: Open Heart Surgery;  Laterality: N/A;  . ENDOVEIN HARVEST OF GREATER SAPHENOUS VEIN  Right 12/04/2015   Procedure: ENDOVEIN HARVEST OF GREATER SAPHENOUS VEIN;  Surgeon: Melrose Nakayama, MD;  Location: [place];  Service: Open Heart Surgery;  Laterality: Right;  . ESOPHAGEAL BANDING  02/01/2019   Procedure: ESOPHAGEAL BANDING;  Surgeon: Milus Banister, MD;  Location: [facility];  Service: Endoscopy;;  . ESOPHAGEAL BANDING  02/11/2019   Procedure: ESOPHAGEAL BANDING;  Surgeon: Juanita Craver, MD;  Location[facility] ENDOSCOPY;  Service: Endoscopy;;  . ESOPHAGOGASTRODUODENOSCOPY N/A 02/11/2019   Procedure: ESOPHAGOGASTRODUODENOSCOPY (EGD);  Surgeon: Juanita Craver, MD;  Location: [facility] ENDOSCOPY;  Service: Endoscopy;  Laterality: N/A;  . ESOPHAGOGASTRODUODENOSCOPY (EGD) WITH PROPOFOL N/A 05/13/2016   Procedure: ESOPHAGOGASTRODUODENOSCOPY (EGD) WITH PROPOFOL;  Surgeon: Milus Banister, MD;  Location: [facility]  ENDOS[facility];  Service: Endoscopy;  Laterality: N/A;  . ESOPHAGOGASTRODUODENOSCOPY (EGD) WITH PROPOFOL N/A 02/01/2019   Procedure: ESOPHAGOGASTRODUODENOSCOPY (EGD) WITH PROPOFOL;  Surgeon: Milus Banister, MD;  Location: [facility];  Service: Endoscopy;  Laterality: N/A;  . HEMOSTASIS CLIP PLACEMENT  02/11/2019   Procedure: HEMOSTASIS CLIP PLACEMENT;  Surgeon: Juanita Craver, MD;  Location: [place] ENDOSCOPY;  Service: Endoscopy;;  . IR ANGIOGRAM SELECTIVE EACH ADDITIONAL VESSEL  02/12/2019  . IR EMBO ART  VEN HEMORR LYMPH EXTRAV  INC GUIDE ROADMAPPING  02/12/2019  . IR PARACENTESIS  02/12/2019  . IR TIPS  02/12/2019  . MAXIMUM ACCESS (MAS)POSTERIOR LUMBAR INTERBODY FUSION (PLIF) 1 LEVEL Left 04/16/2015   Procedure: FOR MAXIMUM ACCESS (MAS) POSTERIOR LUMBAR INTERBODY FUSION (PLIF) LUMBAR THREE-FOUR EXTRAFORAMINAL MICRODISCECTOMY LUMBAR FIVE-SACRAL ONE LEFT;  Surgeon: Eustace Moore, MD;  Location: [place] NEURO ORS;  Service: Neurosurgery;  Laterality: Left;  . RADIOLOGY WITH ANESTHESIA N/A 02/12/2019   Procedure: RADIOLOGY WITH ANESTHESIA;  Surgeon: Radiologist, Medication, MD;  Location: [place];  Service: Radiology;  Laterality: N/A;  . SCLEROTHERAPY  02/11/2019   Procedure: SCLEROTHERAPY;  Surgeon: Juanita Craver, MD;  Location[facility] ENDOSCOPY;  Service: Endoscopy;;  . TEE WITHOUT CARDIOVERSION N/A 12/04/2015   Procedure: TRANSESOPHAGEAL ECHOCARDIOGRAM (TEE);  Surgeon: Melrose Nakayama, MD;  Location: [place];  Service: Open Heart Surgery;  Laterality: N/A;  . TUBAL LIGATION  1982   Dr Connye Burkitt  . UPPER GASTROINTESTINAL ENDOSCOPY    . VAGINAL HYSTERECTOMY  1997   Dr Rande Lawman   Family History  Family History  Problem Relation Age of Onset  . Lung cancer Father   . Arthritis Sister   . Arthritis Brother   . Heart disease Maternal Grandmother   . Heart disease Maternal Grandfather   . Heart disease Paternal Grandmother   . Heart disease Paternal Grandfather   . Breast cancer Mother   . Liver cancer Brother    . Breast cancer Maternal Aunt   . Breast cancer Paternal Aunt   . Colon cancer Neg Hx   . Esophageal cancer Neg Hx   . Rectal cancer Neg Hx   . Stomach cancer Neg Hx    Social History  reports that she has never smoked. She has never used smokeless tobacco. She reports that she does not drink alcohol or use drugs. Allergies  Allergies  Allergen Reactions  . Kiwi Extract Anaphylaxis  . Tdap [Tetanus-Diphth-Acell Pertussis] Swelling and Other (See Comments)    Swelling at injection site, gets very hot  . Statins     RHABDOMYOLYSIS  . Latex Itching, Dermatitis and Rash  . Tramadol Nausea And Vomiting   Home medications Prior to Admission medications   Medication Sig Start Date End Date Taking? Authorizing Provider  acetaminophen (TYLENOL) 500 MG  tablet Take 500 mg by mouth at bedtime.     [provider]  Aromatic Inhalants (VICKS VAPOR IN) Vicks Vapor Rub apply small amount to outside of nose to help breathing    [provider]  BD PEN NEEDLE NANO U/F 32G X 4 MM MISC USE THREE TIMES DAILY AS DIRECTED 10/02/18   Reed, Tiffany L, DO  Biotin 10000 MCG TABS Take 10,000 mcg by mouth every morning.    [provider]  bisacodyl (DULCOLAX) 10 MG suppository Place 1 suppository (10 mg total) rectally daily as needed for moderate constipation. 02/16/19   Aline August, MD  calcium carbonate (OS-CAL) 600 MG TABS Take 600 mg by mouth 2 (two) times daily with a meal.      [provider]  Cholecalciferol (VITAMIN D) 50 MCG (2000 UT) CAPS Take 2,000 Units by mouth daily.     [provider]  Cyanocobalamin (VITAMIN B 12 PO) Take 1,000 mcg by mouth daily.      [provider]  ezetimibe (ZETIA) 10 MG tablet TAKE 1 TABLET(10 MG) BY MOUTH DAILY 11/01/18   Reed, Tiffany L, DO  furosemide (LASIX) 40 MG tablet Take 40 mg by mouth daily.     [provider]  glucose blood test strip One Touch Ultra II strips. Use to test blood sugar three  times daily. Dx: E11.65 04/07/17   Reed, Tiffany L, DO  insulin detemir (LEVEMIR) 100 UNIT/ML injection Inject 0.2 mLs (20 Units total) into the skin at bedtime. 02/17/19   Aline August, MD  Insulin Syringe-Needle U-100 (INSULIN SYRINGE 1CC/31GX5/16") 31G X 5/16" 1 ML MISC USE AS DIRECTED DAILY WITH LEVEMIR 06/02/18   Reed, Tiffany L, DO  JARDIANCE 25 MG TABS tablet Take 25 mg by mouth daily. 03/06/19   [provider]  lactulose (CHRONULAC) 10 GM/15ML solution Take 20 g by mouth 3 (three) times daily. 03/10/19   [provider]  loratadine (CLARITIN) 10 MG tablet Take 10 mg by mouth daily as needed for allergies.    [provider]  MAGNESIUM PO Take 500 mg by mouth 2 (two) times daily in the am and at bedtime..    [provider]  Multiple Vitamins-Minerals (MULTIVITAMIN WITH MINERALS) tablet Take 1 tablet by mouth daily.      [provider]  NOVOLOG FLEXPEN 100 UNIT/ML FlexPen Inject 12 units under the skin every morning, 8 units at lunch and 12 units at supper 12/26/18   Reed, Tiffany L, DO  ondansetron (ZOFRAN) 4 MG tablet Take 1 tablet (4 mg total) by mouth every 6 (six) hours as needed for nausea. 02/16/19   Aline August, MD  pantoprazole (PROTONIX) 40 MG tablet Take 1 tablet (40 mg total) by mouth 2 (two) times daily. 02/16/19   Aline August, MD  Polyethyl Glycol-Propyl Glycol (SYSTANE OP) Place 1 drop into both eyes 2 (two) times daily.    [provider]  Probiotic Product (PROBIOTIC DAILY PO) Take 1 capsule by mouth daily. Digestive Advantage Probiotic    [provider]  spironolactone (ALDACTONE) 50 MG tablet Take 1 tablet (50 mg total) by mouth 2 (two) times daily. 02/19/19   Gayland Curry, DO   Liver Function Tests Recent Labs  Lab 03/19/19 1436 03/21/19 1042  AST 58* 62*  ALT 41* 45*  ALKPHOS  --  127*  BILITOT 3.5* 3.4*  PROT 7.8 7.2  ALBUMIN  --  3.0*   Recent Labs  Lab 03/21/19 1042  LIPASE  29    CBC Recent Labs  Lab 03/19/19 1436 03/21/19 1042 03/21/19 1056  WBC 10.4 7.7  --   NEUTROABS 8,299* 5.9  --   HGB 16.1* 14.9 15.0  HCT 47.2* 43.4 44.0  MCV 92.5 94.6  --   PLT 151 116*  --    Basic Metabolic Panel Recent Labs  Lab 03/19/19 1436 03/21/19 1042 03/21/19 1056  NA 133* 131* 133*  K 4.5 4.6 4.6  CL 96* 97*  --   CO2 24 23  --   GLUCOSE 269* 138*  --   BUN 19 20  --   CREATININE 1.05* 1.13*  --   CALCIUM 11.1* 10.0  --    Iron/TIBC/Ferritin/ %Sat    Component Value Date/Time   IRON 29 03/13/2016 1422   TIBC 427 03/13/2016 1422   FERRITIN 13 03/13/2016 1422   IRONPCTSAT 7 (L) 03/13/2016 1422    Vitals:   03/21/19 1018 03/21/19 1030 03/21/19 1045 03/21/19 1215  BP: (!) 123/51 (!) 101/46  (!) 125/54  Pulse: 89 83    Resp: 16 15  20   Temp: 97.8 F (36.6 C)  97.9 F (36.6 C)   TempSrc: Oral  Rectal   SpO2: 99% 98%      Exam Gen alert, lying flat, pleaseant No rash, cyanosis or gangrene Sclera anicteric, throat clear   No jvd or bruits Chest clear bilat to bases RRR no MRG Abd soft ntnd no mass or ascites +bs GU normal male w/ foley draining dark red bloody urine in small amts, there is about 400 cc dark red bloody urine in the bag MS no joint effusions or deformity Ext no LE or UE edema, no wounds or ulcers Neuro is alert, Ox 3 , nf    Home meds:  - amlodipine 10/ furosemide 40  - lipitor 40/ omeprazole 20 bid  - sertraline 100/ trazodone 100/ hydrocodone-aceta prn/ gabapentin 100 bid  - prednisone 5 qd prn gout  - prn's/ vitamins/ supplements     Assessment/ Plan: 1. Hypotension/ shock - suspect combination of hypovolemia from bleeding possibly, on home lasix and npo, etc.., also anesthesia.  BP's better after 2L NS in ED, still tachy though. Will repeat Hb (13 in ED) and rebolus 1-2 L . Does not appear septic. Long shot but could be adrenal insuff as stopped po pred 1 mo ago after some time on it for gout flares, will give HC IV 50  tid.  2. Hematuria - sp urology procedure at [facility] this am.  Have d/w urology, they recommend flushing catheter to make sure is not occluded. They will see in am, or sooner if needed if becomes hemodynamically unstable or cath not draining tonight. Repeating Hb now and at 12 MN.  3. HTN - hold norvasc and lasix for now 4. HL - statin 5. H/o gout - not active 6. Prostate cancer - hx of prostatectomy      Kelly Splinter, MD   [facility] 03/21/2019, 1:10 PM

## 2019-05-15 NOTE — ED Notes (Signed)
Updated patient's family, wife's number [phone]

## 2019-05-15 NOTE — ED Provider Notes (Addendum)
[place] DEPT Provider Note   CSN: [id number] Arrival date & time: 05/15/19  1410     History Chief Complaint  Patient presents with  . Hypotension    Steven Bradley is a 74 y.o. male.  Patient is a 74 year old male with a history of prostate cancer, syncope, hypertension and difficulty urinating who was at the [place] today with surgery for scar tissue removal and was discharged earlier today.  However when he got home he was unable to get out of the car because he was extremely weak and nauseated.  He was then brought to the emergency room.  Patient reports he feels nauseated with an upset stomach but denies any pain in the bladder area.  He also feels a little bit dizzy but denies any chest pain or shortness of breath.  Patient felt normal when he got there today for the procedure.  He did take his blood pressure medication today.  The history is provided by the patient.       Past Medical History:  Diagnosis Date  . Arthritis   . Enlarged prostate   . GERD (gastroesophageal reflux disease)   . Gout    Patient doesn't recall having gout but was prescribed allopurinol for ankle swelling.  Marland Kitchen Hyperlipidemia   . Hypertension   . Prostate cancer [facility]) June 2015  . PTSD (post-traumatic stress disorder)   . Syncope    Negative outpatient workup.  . Vitamin D deficiency     Patient Active Problem List   Diagnosis Date Noted  . Gout of multiple sites 12/22/2018  . Macrocytosis 12/21/2018  . COVID-19 virus infection 12/21/2018  . Severe sepsis ([place]) 06/04/2016  . Bacteremia due to Gram-positive bacteria 06/04/2016  . Acute pyelonephritis 06/02/2016  . Hyperlipemia 06/01/2016  . GERD (gastroesophageal reflux disease) 06/01/2016  . Nausea and vomiting 06/01/2016  . Nephrotic syndrome 12/29/2013  . Purpura ([place]) 12/29/2013  . Hypokalemia 12/29/2013  . Hyponatremia 12/29/2013  . Thrombocytopenia ([place]) 12/29/2013  . Essential hypertension  12/29/2013  . Elevated LFTs 12/29/2013    Past Surgical History:  Procedure Laterality Date  . PROSTATE BIOPSY  June 2015  . PROSTATE SURGERY    . Right hand surgery    . Scrapnel removal from scalp     During [place] war       No family history on file.  Social History   Tobacco Use  . Smoking status: Never Smoker  . Smokeless tobacco: Never Used  Substance Use Topics  . Alcohol use: Yes    Comment: occasionaly  . Drug use: No    Home Medications Prior to Admission medications   Medication Sig Start Date End Date Taking? Authorizing Provider  acetaminophen (TYLENOL) 325 MG tablet Take 2 tablets (650 mg total) by mouth every 6 (six) hours as needed for mild pain (or Fever >/= 101). Patient not taking: Reported on 06/15/2016 06/04/16   Debbe Odea, MD  amLODipine (NORVASC) 10 MG tablet Take 10 mg by mouth daily as needed (for BP >150).     [provider]  atorvastatin (LIPITOR) 80 MG tablet Take 40 mg by mouth at bedtime.    [provider]  furosemide (LASIX) 40 MG tablet Take 40 mg by mouth daily.    [provider]  gabapentin (NEURONTIN) 100 MG capsule Take 100 mg by mouth 2 (two) times daily.    [provider]  HYDROcodone-acetaminophen (NORCO) 10-325 MG per tablet Take 1 tablet by mouth every  12 (twelve) hours as needed for moderate pain.     [provider]  ibuprofen (ADVIL) 200 MG tablet Take 2 tablets (400 mg total) by mouth every 6 (six) hours as needed for fever or moderate pain. 12/24/18   [name], MD  ibuprofen (ADVIL) 200 MG tablet Take 1 tablet (200 mg total) by mouth every 4 (four) hours as needed for fever or moderate pain. 12/24/18   [name], MD  magnesium oxide (MAGNESIUM-OXIDE) 400 (241.3 Mg) MG tablet Take 400 mg by mouth daily.    [provider]  Multiple Vitamin (MULTIVITAMIN WITH MINERALS) TABS tablet Take 1 tablet by mouth daily.    [provider]  omeprazole  (PRILOSEC) 20 MG capsule Take 2 capsules (40 mg total) by mouth 2 (two) times daily before a meal. 12/24/18   [name], MD  predniSONE (DELTASONE) 5 MG tablet Take 5 mg by mouth daily as needed (gout).    [provider]  sertraline (ZOLOFT) 100 MG tablet Take 100 mg by mouth daily.    [provider]  Tetrahydroz-Dextran-PEG-Povid (VISINE ADVANCED RELIEF) 0.05-0.1-1-1 % SOLN Place 1 drop into both eyes daily as needed (for irritated eyes).     [provider]  traZODone (DESYREL) 100 MG tablet Take 100 mg by mouth at bedtime.    [provider]    Allergies    Allopurinol and Vitamin d analogs  Review of Systems   Review of Systems  All other systems reviewed and are negative.   Physical Exam Updated Vital Signs BP 95/83 (BP Location: Right Arm)   Pulse (!) 105   Temp 97.7 F (36.5 C) (Oral)   Resp 16   Ht 6\' 6"  (1.981 m)   Wt 100.2 kg   SpO2 96%   BMI 25.54 kg/m   Physical Exam Vitals and nursing note reviewed.  Constitutional:      General: He is not in acute distress.    Appearance: Normal appearance. He is well-developed and normal weight.  HENT:     Head: Normocephalic and atraumatic.  Eyes:     Conjunctiva/sclera: Conjunctivae normal.     Pupils: Pupils are equal, round, and reactive to light.  Cardiovascular:     Rate and Rhythm: Regular rhythm. Tachycardia present.     Heart sounds: No murmur.  Pulmonary:     Effort: Pulmonary effort is normal. No respiratory distress.     Breath sounds: Normal breath sounds. No wheezing or rales.  Abdominal:     General: There is no distension.     Palpations: Abdomen is soft.     Tenderness: There is no abdominal tenderness. There is no guarding or rebound.  Genitourinary:    Comments: Foley in place with blood in bag Musculoskeletal:        General: No tenderness. Normal range of motion.     Cervical back: Normal range of motion and neck supple.  Skin:    General: Skin  is warm and dry.     Coloration: Skin is pale.     Findings: No erythema or rash.  Neurological:     General: No focal deficit present.     Mental Status: He is alert and oriented to person, place, and time. Mental status is at baseline.  Psychiatric:        Mood and Affect: Mood normal.        Behavior: Behavior normal.        Thought Content: Thought  content normal.     ED Results / Procedures / Treatments   Labs (all labs ordered are listed, but only abnormal results are displayed) Labs Reviewed  CBC WITH DIFFERENTIAL/PLATELET - Abnormal; Notable for the following components:      Result Value   WBC 2.0 (*)    RBC 3.62 (*)    HCT 38.1 (*)    MCV 105.2 (*)    MCH 36.2 (*)    Platelets 134 (*)    nRBC 3.1 (*)    Lymphs Abs 0.1 (*)    Monocytes Absolute 0.0 (*)    All other components within normal limits  COMPREHENSIVE METABOLIC PANEL - Abnormal; Notable for the following components:   Potassium 2.7 (*)    CO2 19 (*)    Glucose, Bld 105 (*)    BUN 24 (*)    Calcium 8.6 (*)    Total Protein 8.5 (*)    AST 64 (*)    Total Bilirubin 1.5 (*)    Anion gap 19 (*)    All other components within normal limits  LACTIC ACID, PLASMA - Abnormal; Notable for the following components:   Lactic Acid, Venous 5.1 (*)    All other components within normal limits  PROTIME-INR  APTT  TYPE AND SCREEN  ABO/RH  TROPONIN I (HIGH SENSITIVITY)  TROPONIN I (HIGH SENSITIVITY)    EKG EKG Interpretation  Date/Time:  Tuesday May 15 2019 14:13:02 EST Ventricular Rate:  104 PR Interval:    QRS Duration: 92 QT Interval:  407 QTC Calculation: 536 R Axis:   65 Text Interpretation: Sinus tachycardia Atrial premature complexes Prolonged QT interval No significant change since last tracing Confirmed by Blanchie Dessert [id number]) on 05/15/2019 2:17:06 PM   Radiology DG Chest Port 1 View  Result Date: 05/15/2019 CLINICAL DATA:  Hypotension. EXAM: PORTABLE CHEST 1 VIEW COMPARISON:  Chest  x-ray dated December 21, 2018. FINDINGS: The heart size and mediastinal contours are within normal limits. Both lungs are clear. The visualized skeletal structures are unremarkable. IMPRESSION: No active disease. Electronically Signed   By: Titus Dubin M.D.   On: 05/15/2019 14:58    Procedures Procedures (including critical care time)  Medications Ordered in ED Medications  sodium chloride 0.9 % bolus 1,000 mL (has no administration in time range)  metoCLOPramide (REGLAN) injection 10 mg (10 mg Intravenous Given 05/15/19 1426)    ED Course  I have reviewed the triage vital signs and the nursing notes.  Pertinent labs & imaging results that were available during my care of the patient were reviewed by me and considered in my medical decision making (see chart for details).    MDM Rules/Calculators/A&P                      Patient presenting today after a bladder surgery at the [place] because he was too weak to get out of the car.  Patient is hypotensive and mildly tachycardic here.  He is vomiting and after vomiting has an episode of vagal hypotension.  This improves with Trendelenburg and IV fluids.  Patient is complaining of nausea but denies any chest pain or shortness of breath.  He has no significant bladder discomfort.  He does not take anticoagulation but does have blood in the Foley catheter from the procedure today.  Hemoglobin is stable at 13.  Coags are normal.  Chest x-ray within normal limits.  EKG without acute process.  We will continue to monitor patient and  ensure blood pressure remained stable.  Troponin and CMP are pending.  Will repeat CBC in a few hours to ensure no acute change in hemoglobin.  Low suspicion for sepsis and suspect this is from anesthesia versus bleeding postoperatively.  3:42 PM Patient's lactate is 5.  CMP with a potassium of 2.7 and anion gap of 19.  Suspect this is the cause of the patient's hypotension initially.  However patient does now tachycardic with  a rate in the 130s to 140s.  Repeat EKG pending.  4:05 PM Repeat EKG showed sinus tachy.  Pt admitted for further care  CRITICAL CARE Performed by: Marce Schartz Total critical care time: 30 minutes Critical care time was exclusive of separately billable procedures and treating other patients. Critical care was necessary to treat or prevent imminent or life-threatening deterioration. Critical care was time spent personally by me on the following activities: development of treatment plan with patient and/or surrogate as well as nursing, discussions with consultants, evaluation of patient's response to treatment, examination of patient, obtaining history from patient or surrogate, ordering and performing treatments and interventions, ordering and review of laboratory studies, ordering and review of radiographic studies, pulse oximetry and re-evaluation of patient's condition.   Final Clinical Impression(s) / ED Diagnoses Final diagnoses:  Lactic acidosis  Hypokalemia  Hypotension, unspecified hypotension type    Rx / DC Orders ED Discharge Orders    None       Blanchie Dessert, MD 05/15/19 1606    Blanchie Dessert, MD 05/15/19 1658

## 2019-05-16 DIAGNOSIS — E871 Hypo-osmolality and hyponatremia: Secondary | ICD-10-CM | POA: Diagnosis present

## 2019-05-16 DIAGNOSIS — I959 Hypotension, unspecified: Secondary | ICD-10-CM | POA: Diagnosis present

## 2019-05-16 DIAGNOSIS — Z8249 Family history of ischemic heart disease and other diseases of the circulatory system: Secondary | ICD-10-CM | POA: Diagnosis not present

## 2019-05-16 DIAGNOSIS — Z801 Family history of malignant neoplasm of trachea, bronchus and lung: Secondary | ICD-10-CM | POA: Diagnosis not present

## 2019-05-16 DIAGNOSIS — Z8261 Family history of arthritis: Secondary | ICD-10-CM | POA: Diagnosis not present

## 2019-05-16 DIAGNOSIS — M1990 Unspecified osteoarthritis, unspecified site: Secondary | ICD-10-CM | POA: Diagnosis present

## 2019-05-16 DIAGNOSIS — I10 Essential (primary) hypertension: Secondary | ICD-10-CM | POA: Diagnosis present

## 2019-05-16 DIAGNOSIS — E1122 Type 2 diabetes mellitus with diabetic chronic kidney disease: Secondary | ICD-10-CM | POA: Diagnosis present

## 2019-05-16 DIAGNOSIS — N400 Enlarged prostate without lower urinary tract symptoms: Secondary | ICD-10-CM | POA: Diagnosis present

## 2019-05-16 DIAGNOSIS — E876 Hypokalemia: Secondary | ICD-10-CM | POA: Diagnosis present

## 2019-05-16 DIAGNOSIS — I129 Hypertensive chronic kidney disease with stage 1 through stage 4 chronic kidney disease, or unspecified chronic kidney disease: Secondary | ICD-10-CM | POA: Diagnosis present

## 2019-05-16 DIAGNOSIS — F4310 Post-traumatic stress disorder, unspecified: Secondary | ICD-10-CM | POA: Diagnosis present

## 2019-05-16 DIAGNOSIS — K219 Gastro-esophageal reflux disease without esophagitis: Secondary | ICD-10-CM | POA: Diagnosis present

## 2019-05-16 DIAGNOSIS — Z20822 Contact with and (suspected) exposure to covid-19: Secondary | ICD-10-CM | POA: Diagnosis present

## 2019-05-16 DIAGNOSIS — N390 Urinary tract infection, site not specified: Secondary | ICD-10-CM | POA: Diagnosis present

## 2019-05-16 DIAGNOSIS — E559 Vitamin D deficiency, unspecified: Secondary | ICD-10-CM | POA: Diagnosis present

## 2019-05-16 DIAGNOSIS — Z79899 Other long term (current) drug therapy: Secondary | ICD-10-CM | POA: Diagnosis not present

## 2019-05-16 DIAGNOSIS — Z803 Family history of malignant neoplasm of breast: Secondary | ICD-10-CM | POA: Diagnosis not present

## 2019-05-16 DIAGNOSIS — E785 Hyperlipidemia, unspecified: Secondary | ICD-10-CM | POA: Diagnosis present

## 2019-05-16 DIAGNOSIS — Z8546 Personal history of malignant neoplasm of prostate: Secondary | ICD-10-CM | POA: Diagnosis not present

## 2019-05-16 DIAGNOSIS — E872 Acidosis: Secondary | ICD-10-CM | POA: Diagnosis present

## 2019-05-16 DIAGNOSIS — M109 Gout, unspecified: Secondary | ICD-10-CM | POA: Diagnosis present

## 2019-05-16 DIAGNOSIS — N181 Chronic kidney disease, stage 1: Secondary | ICD-10-CM | POA: Diagnosis present

## 2019-05-16 DIAGNOSIS — Z888 Allergy status to other drugs, medicaments and biological substances status: Secondary | ICD-10-CM | POA: Diagnosis not present

## 2019-05-16 LAB — BASIC METABOLIC PANEL
Anion gap: 12 (ref 5–15)
Anion gap: 11 (ref 5–15)
BUN: 20 mg/dL (ref 8–23)
BUN: 20 mg/dL (ref 8–23)
CO2: 18 mmol/L — ABNORMAL LOW (ref 22–32)
CO2: 21 mmol/L — ABNORMAL LOW (ref 22–32)
Calcium: 8.1 mg/dL — ABNORMAL LOW (ref 8.9–10.3)
Calcium: 8.1 mg/dL — ABNORMAL LOW (ref 8.9–10.3)
Chloride: 101 mmol/L (ref 98–111)
Chloride: 102 mmol/L (ref 98–111)
Creatinine, Ser: 0.98 mg/dL (ref 0.61–1.24)
Creatinine, Ser: 0.88 mg/dL (ref 0.61–1.24)
GFR calc Af Amer: >60 mL/min (ref >60)
GFR calc Af Amer: >60 mL/min (ref >60)
GFR calc non Af Amer: >60 mL/min (ref >60)
GFR calc non Af Amer: >60 mL/min (ref >60)
Glucose, Bld: 111 mg/dL — ABNORMAL HIGH (ref 70–99)
Glucose, Bld: 130 mg/dL — ABNORMAL HIGH (ref 70–99)
Potassium: 3.5 mmol/L (ref 3.5–5.1)
Potassium: 3.8 mmol/L (ref 3.5–5.1)
Sodium: 131 mmol/L — ABNORMAL LOW (ref 135–145)
Sodium: 134 mmol/L — ABNORMAL LOW (ref 135–145)

## 2019-05-16 LAB — CBC
HCT: 31.3 % — ABNORMAL LOW (ref 39.0–52.0)
Hemoglobin: 10.6 g/dL — ABNORMAL LOW (ref 13.0–17.0)
MCH: 35.6 pg — ABNORMAL HIGH (ref 26.0–34.0)
MCHC: 33.9 g/dL (ref 30.0–36.0)
MCV: 105 fL — ABNORMAL HIGH (ref 80.0–100.0)
Platelets: 100 10*3/uL — ABNORMAL LOW (ref 150–400)
RBC: 2.98 MIL/uL — ABNORMAL LOW (ref 4.22–5.81)
RDW: 13.6 % (ref 11.5–15.5)
WBC: 14.9 10*3/uL — ABNORMAL HIGH (ref 4.0–10.5)
nRBC: 0 % (ref 0.0–0.2)

## 2019-05-16 LAB — ABO/RH: ABO/RH(D): O POS

## 2019-05-16 LAB — MAGNESIUM
Magnesium: 0.9 mg/dL — CL (ref 1.7–2.4)
Magnesium: 2 mg/dL (ref 1.7–2.4)

## 2019-05-16 LAB — SARS CORONAVIRUS 2 (TAT 6-24 HRS): SARS Coronavirus 2: NEGATIVE

## 2019-05-16 MED ORDER — CHLORHEXIDINE GLUCONATE CLOTH 2 % EX PADS
6.0000 | MEDICATED_PAD | Freq: Every day | CUTANEOUS | Status: DC
  Administered 2019-05-16 – 2019-05-17 (×2): 6 via TOPICAL

## 2019-05-16 MED ORDER — MAGNESIUM SULFATE 4 GM/100ML IV SOLN
4.0000 g | Freq: Once | INTRAVENOUS | Status: AC
  Administered 2019-05-16: 4 g via INTRAVENOUS
  Filled 2019-05-16: qty 100

## 2019-05-16 NOTE — Progress Notes (Signed)
PROGRESS NOTE    Steven Bradley    Code Status: Full Code  [id number] DOB: [birth year] DOA: 05/15/2019 LOS: 0 days  PCP: [facility] CC:  Chief Complaint  Patient presents with  . Hypotension       Hospital Summary   This is a 74 year old male with history of hypertension, syncope hyper lipidemia, gout, DJD, prostate cancer s/p prostatectomy 4 years ago who was at the [place] early 3/2 for bladder procedure (resection of bladder neck contracture?)  And was DC'd home.  Once at home he felt weak and nauseated and brought to the ED with complaints of dizziness.  SBP in the low 90s then dropped to 60s.  Patient received 2 L NS bolus with improvement in BPs.  HR 110 then 140s and then improved.  Remained on room air.  Admitted for hypotension suspected from hypovolemia and possible shock with concern for adrenal insufficiency.  He was started on stress dose steroids.  Also noted to have hematuria status post procedure and urology was consulted.    3/3: urology did not recommend further urologic procedure and recommended continuing Foley catheter for drainage and continuing at discharge.  Recommended urine and blood cultures and starting empiric antibiotics.  A & P   Principal Problem:   Hypotension Active Problems:   Essential hypertension   Tachycardia   History of bladder surgery   Leukopenia   Hypovolemia   Gross hematuria   1. Hypotension and tachycardia, likely from hypovolemia vs. urologic procedure a. unlikely shock given improvement with IV fluids. b. Unlikely adrenal insufficiency as patient has been off his steroids for reportedly at least 1 month and was looks like he was on 5 mg for gout.  Adrenal insufficiency would likely occur with much higher doses of steroids and discontinuing would have likely occurred much closer to timing of discontinuation c. Afebrile and hemodynamically stable currently d. Leukocytosis likely steroid-induced e. Discontinued  steroids f. Agree with urine and blood cultures, ordered g. Hold antihypertensives h. Continue Foley per urology, patient to follow-up with [place] urologist for catheter removal in about 5 days i. Hold off on empiric antibiotics at this time as patient is afebrile on room air but have low threshold to start patient becomes febrile or any other sign of instability 2. Hypokalemia a. Improved with half-normal saline and KCl b. Discontinue IV fluids and follow-up BMP 3. Hyponatremia a. Follow-up BMP 4. Leukocytosis, likely steroid-induced a. Discontinue steroids and follow-up cultures 5. Macrocytic anemia with thrombocytopenia a. Check CBC with differential in a.m. 6. Hematuria secondary to urologic procedure a. Appreciate further urology recommendations 7. Hypertension a. Continue holding Norvasc and Lasix for now 8. Hyperlipidemia on statin 9. History of gout flare, not active 10. Prostate cancer s/p prostatectomy several years ago  DVT prophylaxis: SCD Family Communication: No family at bedside Disposition Plan:   Patient came from:   Home                                                                                          Anticipated d/c place: Home, patient declining home health PT  Barriers to d/c: Stability of  electrolytes, volume status and blood pressure and infectious work-up.  Hopeful discharge tomorrow if he remains hemodynamically stable  Pressure injury documentation    None  Consultants  Urology  Procedures  None  Antibiotics   Anti-infectives (From admission, onward)   None        Subjective   Patient seen and examined at bedside in no acute distress and resting comfortably. No acute events overnight. Denies any acute complaints at this time. Tolerating diet well.   Objective   Vitals:   05/15/19 2027 05/16/19 0458 05/16/19 0500 05/16/19 1317  BP: 129/87 115/81  131/83  Pulse:  85  85  Resp:  18  14  Temp:  98.1 F (36.7 C)  (!) 97.5 F (36.4  C)  TempSrc:  Oral  Oral  SpO2:  98%  96%  Weight:   99 kg   Height:        Intake/Output Summary (Last 24 hours) at 05/16/2019 1556 Last data filed at 05/16/2019 1500 Gross per 24 hour  Intake 2404.54 ml  Output 1800 ml  Net 604.54 ml   Filed Weights   05/15/19 1415 05/15/19 1812 05/16/19 0500  Weight: 100.2 kg 95.9 kg 99 kg    Examination:  Physical Exam Vitals and nursing note reviewed.  Constitutional:      Appearance: Normal appearance.     Comments: Seems slightly tired  HENT:     Head: Normocephalic and atraumatic.  Eyes:     Conjunctiva/sclera: Conjunctivae normal.  Cardiovascular:     Rate and Rhythm: Normal rate and regular rhythm.  Pulmonary:     Effort: Pulmonary effort is normal.     Breath sounds: Normal breath sounds.  Abdominal:     General: Abdomen is flat.     Palpations: Abdomen is soft.  Musculoskeletal:        General: No swelling or tenderness.  Skin:    Coloration: Skin is not jaundiced or pale.  Neurological:     Mental Status: He is alert. Mental status is at baseline.  Psychiatric:        Mood and Affect: Mood normal.        Behavior: Behavior normal.     Data Reviewed: I have personally reviewed following labs and imaging studies  CBC: Recent Labs  Lab 05/15/19 1424 05/15/19 1839 05/16/19 0007  WBC 2.0* 10.5 14.9*  NEUTROABS 1.8  --   --   HGB 13.1 12.1* 10.6*  HCT 38.1* 36.7* 31.3*  MCV 105.2* 110.2* 105.0*  PLT 134* 108* 123XX123*   Basic Metabolic Panel: Recent Labs  Lab 05/15/19 1424 05/16/19 0007  NA 136 131*  K 2.7* 3.5  CL 98 101  CO2 19* 18*  GLUCOSE 105* 111*  BUN 24* 20  CREATININE 1.13 0.98  CALCIUM 8.6* 8.1*   GFR: Estimated Creatinine Clearance: 88.1 mL/min (by C-G formula based on SCr of 0.98 mg/dL). Liver Function Tests: Recent Labs  Lab 05/15/19 1424  AST 64*  ALT 24  ALKPHOS 104  BILITOT 1.5*  PROT 8.5*  ALBUMIN 3.9   No results for input(s): LIPASE, AMYLASE in the last 168 hours. No  results for input(s): AMMONIA in the last 168 hours. Coagulation Profile: Recent Labs  Lab 05/15/19 1424  INR 1.1   Cardiac Enzymes: No results for input(s): CKTOTAL, CKMB, CKMBINDEX, TROPONINI in the last 168 hours. BNP (last 3 results) No results for input(s): PROBNP in the last 8760 hours. HbA1C: No results for input(s): HGBA1C in the last  72 hours. CBG: No results for input(s): GLUCAP in the last 168 hours. Lipid Profile: No results for input(s): CHOL, HDL, LDLCALC, TRIG, CHOLHDL, LDLDIRECT in the last 72 hours. Thyroid Function Tests: No results for input(s): TSH, T4TOTAL, FREET4, T3FREE, THYROIDAB in the last 72 hours. Anemia Panel: No results for input(s): VITAMINB12, FOLATE, FERRITIN, TIBC, IRON, RETICCTPCT in the last 72 hours. Sepsis Labs: Recent Labs  Lab 05/15/19 1440  LATICACIDVEN 5.1*    Recent Results (from the past 240 hour(s))  SARS CORONAVIRUS 2 (TAT 6-24 HRS) Nasopharyngeal Nasopharyngeal Swab     Status: None   Collection Time: 05/15/19  5:44 PM   Specimen: Nasopharyngeal Swab  Result Value Ref Range Status   SARS Coronavirus 2 NEGATIVE NEGATIVE Final    Comment: (NOTE) SARS-CoV-2 target nucleic acids are NOT DETECTED. The SARS-CoV-2 RNA is generally detectable in upper and lower respiratory specimens during the acute phase of infection. Negative results do not preclude SARS-CoV-2 infection, do not rule out co-infections with other pathogens, and should not be used as the sole basis for treatment or other patient management decisions. Negative results must be combined with clinical observations, patient history, and epidemiological information. The expected result is Negative. Fact Sheet for Patients: SugarRoll.be Fact Sheet for Healthcare Providers: https://www.woods-mathews.com/ This test is not yet approved or cleared by the [place] FDA and  has been authorized for detection and/or diagnosis of  SARS-CoV-2 by FDA under an Emergency Use Authorization (EUA). This EUA will remain  in effect (meaning this test can be used) for the duration of the COVID-19 declaration under Section 56 4(b)(1) of the Act, 21 U.S.C. section 360bbb-3(b)(1), unless the authorization is terminated or revoked sooner. Performed at [facility], [place] [address] [place], [place] [address]   Culture, blood (routine x 2)     Status: None (Preliminary result)   Collection Time: 05/16/19 11:33 AM   Specimen: BLOOD  Result Value Ref Range Status   Specimen Description   Final    BLOOD RIGHT HAND Performed at [facility] [address] [name], [place] [address]    Special Requests   Final    BOTTLES DRAWN AEROBIC ONLY Blood Culture adequate volume Performed at [facility] [address] [place], [place] [address]    Culture   Final    NO GROWTH < 12 HOURS Performed at [facility] [address] [place], [place] [address]    Report Status PENDING  Incomplete  Culture, blood (routine x 2)     Status: None (Preliminary result)   Collection Time: 05/16/19 11:37 AM   Specimen: BLOOD  Result Value Ref Range Status   Specimen Description   Final    BLOOD LEFT ANTECUBITAL Performed at [facility] [address], [place], [place] [address]    Special Requests   Final    BOTTLES DRAWN AEROBIC ONLY Blood Culture adequate volume Performed at [facility] [address], [place], [place] [address]    Culture   Final    NO GROWTH < 12 HOURS Performed at [facility] [address], [place], [place] [address]    Report Status PENDING  Incomplete         Radiology Studies: DG Chest Port 1 View  Result Date: 05/15/2019 CLINICAL DATA:  Hypotension. EXAM: PORTABLE CHEST 1 VIEW COMPARISON:  Chest x-ray dated December 21, 2018. FINDINGS: The heart size and mediastinal contours are within normal  limits. Both  lungs are clear. The visualized skeletal structures are unremarkable. IMPRESSION: No active disease. Electronically Signed   By: Titus Dubin M.D.   On: 05/15/2019 14:58        Scheduled Meds: . atorvastatin  40 mg Oral QHS  . Chlorhexidine Gluconate Cloth  6 each Topical Daily  . gabapentin  100 mg Oral BID  . multivitamin with minerals  1 tablet Oral Daily  . pantoprazole  40 mg Oral Daily  . sertraline  100 mg Oral Daily  . traZODone  100 mg Oral QHS   Continuous Infusions: . 0.45 % NaCl with KCl 20 mEq / L 100 mL/hr at 05/16/19 [id number]     Time spent: 26 minutes with over 50% of the time coordinating the patient's care    Harold Hedge, DO [facility] Pager [phone]  Call night coverage person covering after 7pm

## 2019-05-16 NOTE — Consult Note (Signed)
Urology Consult   Physician requesting consult: Roney Jaffe  Reason for consult: Gross hematuria  History of Present Illness: SHONN WITTEMAN is a 74 y.o. male with a remote history of prostate cancer status post prior prostatectomy about 4 years ago who presented on 05/15/2019 after what sounds like a resection of a bladder neck contracture at the [facility] by his urologist.  Records from the [place] are not available for review.  Patient reports that there was profuse bleeding in his catheter prior to discharge from the [place].  He went home and continued to experience hematuria per the catheter.  He also states that he went into "shock."  He was shaking and felt lightheaded and dizzy.  He called the [place] and they advised him to report to the emergency room.  Per report when he arrived in the emergency room his systolic blood pressure was in the 90s with a heart rate ranging up to 140.  He received 2 L bolus and continued fluid resuscitation with quick resolution of hypotension and continued improvement in tachycardia until this normalized.  He was afebrile throughout.  Nursing staff reports that urine has remained clear yellow overnight with no blood clots upon flushing and no difficulty with drainage.  Hemoglobin 13.1 on admission slightly down trended to 10.6 after large-volume fluid resuscitation.  He has not had a transfusion requirement.  WBC 14.9  He is feeling much better this morning, and all vital signs have normalized.  He is not on any blood thinners.   He reports that he is supposed to see his urologist in about 5 days for catheter removal and follow-up.   Past Medical History:  Diagnosis Date  . Arthritis   . Enlarged prostate   . GERD (gastroesophageal reflux disease)   . Gout    Patient doesn't recall having gout but was prescribed allopurinol for ankle swelling.  Marland Kitchen Hyperlipidemia   . Hypertension   . Prostate cancer [facility]) June 2015  . PTSD (post-traumatic stress disorder)   . Syncope     Negative outpatient workup.  . Vitamin D deficiency     Past Surgical History:  Procedure Laterality Date  . PROSTATE BIOPSY  June 2015  . PROSTATE SURGERY    . Right hand surgery    . Scrapnel removal from scalp     During [place] war    Current Hospital Medications:  Home Meds:  No current facility-administered medications on file prior to encounter.   Current Outpatient Medications on File Prior to Encounter  Medication Sig Dispense Refill  . amLODipine (NORVASC) 10 MG tablet Take 10 mg by mouth daily as needed (for BP >150).     Marland Kitchen atorvastatin (LIPITOR) 80 MG tablet Take 40 mg by mouth at bedtime.    . furosemide (LASIX) 40 MG tablet Take 40 mg by mouth daily.    Marland Kitchen gabapentin (NEURONTIN) 100 MG capsule Take 100 mg by mouth 2 (two) times daily.    Marland Kitchen HYDROcodone-acetaminophen (NORCO) 10-325 MG per tablet Take 1 tablet by mouth every 12 (twelve) hours as needed for moderate pain.     Marland Kitchen ibuprofen (ADVIL) 200 MG tablet Take 2 tablets (400 mg total) by mouth every 6 (six) hours as needed for fever or moderate pain. 30 tablet 0  . magnesium oxide (MAGNESIUM-OXIDE) 400 (241.3 Mg) MG tablet Take 400 mg by mouth daily.    . Multiple Vitamin (MULTIVITAMIN WITH MINERALS) TABS tablet Take 1 tablet by mouth daily.    Marland Kitchen omeprazole (PRILOSEC)  20 MG capsule Take 2 capsules (40 mg total) by mouth 2 (two) times daily before a meal. 60 capsule 0  . predniSONE (DELTASONE) 5 MG tablet Take 5 mg by mouth daily as needed (gout).    . sertraline (ZOLOFT) 100 MG tablet Take 100 mg by mouth daily.    Sallye Lat (VISINE ADVANCED RELIEF) 0.05-0.1-1-1 % SOLN Place 1 drop into both eyes daily as needed (for irritated eyes).     . traZODone (DESYREL) 100 MG tablet Take 100 mg by mouth at bedtime.       Scheduled Meds: . atorvastatin  40 mg Oral QHS  . Chlorhexidine Gluconate Cloth  6 each Topical Daily  . gabapentin  100 mg Oral BID  . hydrocortisone sod succinate (SOLU-CORTEF)  inj  50 mg Intravenous Q8H  . multivitamin with minerals  1 tablet Oral Daily  . pantoprazole  40 mg Oral Daily  . sertraline  100 mg Oral Daily  . traZODone  100 mg Oral QHS   Continuous Infusions: . 0.45 % NaCl with KCl 20 mEq / L 100 mL/hr at 05/16/19 0718   PRN Meds:.acetaminophen **OR** acetaminophen, HYDROcodone-acetaminophen, naphazoline-glycerin, ondansetron **OR** ondansetron (ZOFRAN) IV, polyethylene glycol  Allergies:  Allergies  Allergen Reactions  . Allopurinol Rash  . Vitamin D Analogs Rash    No family history on file.  Social History:  reports that he has never smoked. He has never used smokeless tobacco. He reports current alcohol use. He reports that he does not use drugs.  ROS: A complete review of systems was performed.  All systems are negative except for pertinent findings as noted.  Physical Exam:  Vital signs in last 24 hours: Temp:  [97.7 F (36.5 C)-98.6 F (37 C)] 98.1 F (36.7 C) (03/03 0458) Pulse Rate:  [85-141] 85 (03/03 0458) Resp:  [16-24] 18 (03/03 0458) BP: (95-141)/(74-121) 115/81 (03/03 0458) SpO2:  [96 %-100 %] 98 % (03/03 0458) Weight:  [95.9 kg-100.2 kg] 99 kg (03/03 0500) Constitutional:  Alert and oriented, No acute distress Cardiovascular: Regular rate  Respiratory: Normal respiratory effort,  GI: Abdomen is soft, nontender, nondistended, no abdominal masses [id number] draining clear yellow urine in tubing Lymphatic: No lymphadenopathy Neurologic: Grossly intact, no focal deficits Psychiatric: Normal mood and affect  Laboratory Data:  Recent Labs    05/15/19 1424 05/15/19 1839 05/16/19 0007  WBC 2.0* 10.5 14.9*  HGB 13.1 12.1* 10.6*  HCT 38.1* 36.7* 31.3*  PLT 134* 108* 100*    Recent Labs    05/15/19 1424 05/16/19 0007  NA 136 131*  K 2.7* 3.5  CL 98 101  GLUCOSE 105* 111*  BUN 24* 20  CALCIUM 8.6* 8.1*  CREATININE 1.13 0.98     Results for orders placed or performed during the hospital encounter of  05/15/19 (from the past 24 hour(s))  ABO/Rh     Status: None (Preliminary result)   Collection Time: 05/15/19  2:22 PM  Result Value Ref Range   ABO/RH(D)      O POS Performed at [facility], [place] [address], [place], [place] [address]   Type and screen [place]     Status: None   Collection Time: 05/15/19  2:23 PM  Result Value Ref Range   ABO/RH(D) O POS    Antibody Screen NEG    Sample Expiration      05/18/2019,2359 Performed at [facility], [place] [address] [place], [place] [address]   CBC with Differential/Platelet  Status: Abnormal   Collection Time: 05/15/19  2:24 PM  Result Value Ref Range   WBC 2.0 (L) 4.0 - 10.5 K/uL   RBC 3.62 (L) 4.22 - 5.81 MIL/uL   Hemoglobin 13.1 13.0 - 17.0 g/dL   HCT 38.1 (L) 39.0 - 52.0 %   MCV 105.2 (H) 80.0 - 100.0 fL   MCH 36.2 (H) 26.0 - 34.0 pg   MCHC 34.4 30.0 - 36.0 g/dL   RDW 13.3 11.5 - 15.5 %   Platelets 134 (L) 150 - 400 K/uL   nRBC 3.1 (H) 0.0 - 0.2 %   Neutrophils Relative % 89 %   Neutro Abs 1.8 1.7 - 7.7 K/uL   Lymphocytes Relative 7 %   Lymphs Abs 0.1 (L) 0.7 - 4.0 K/uL   Monocytes Relative 1 %   Monocytes Absolute 0.0 (L) 0.1 - 1.0 K/uL   Eosinophils Relative 1 %   Eosinophils Absolute 0.0 0.0 - 0.5 K/uL   Basophils Relative 1 %   Basophils Absolute 0.0 0.0 - 0.1 K/uL   Immature Granulocytes 1 %   Abs Immature Granulocytes 0.01 0.00 - 0.07 K/uL  Comprehensive metabolic panel     Status: Abnormal   Collection Time: 05/15/19  2:24 PM  Result Value Ref Range   Sodium 136 135 - 145 mmol/L   Potassium 2.7 (LL) 3.5 - 5.1 mmol/L   Chloride 98 98 - 111 mmol/L   CO2 19 (L) 22 - 32 mmol/L   Glucose, Bld 105 (H) 70 - 99 mg/dL   BUN 24 (H) 8 - 23 mg/dL   Creatinine, Ser 1.13 0.61 - 1.24 mg/dL   Calcium 8.6 (L) 8.9 - 10.3 mg/dL   Total Protein 8.5 (H) 6.5 - 8.1 g/dL   Albumin 3.9 3.5 - 5.0 g/dL   AST 64 (H) 15 - 41 U/L   ALT 24 0 - 44 U/L   Alkaline  Phosphatase 104 38 - 126 U/L   Total Bilirubin 1.5 (H) 0.3 - 1.2 mg/dL   GFR calc non Af Amer >60 >60 mL/min   GFR calc Af Amer >60 >60 mL/min   Anion gap 19 (H) 5 - 15  Protime-INR     Status: None   Collection Time: 05/15/19  2:24 PM  Result Value Ref Range   Prothrombin Time 14.1 11.4 - 15.2 seconds   INR 1.1 0.8 - 1.2  APTT     Status: None   Collection Time: 05/15/19  2:24 PM  Result Value Ref Range   aPTT 25 24 - 36 seconds  Troponin I (High Sensitivity)     Status: None   Collection Time: 05/15/19  2:24 PM  Result Value Ref Range   Troponin I (High Sensitivity) 5 <18 ng/L  Lactic acid, plasma     Status: Abnormal   Collection Time: 05/15/19  2:40 PM  Result Value Ref Range   Lactic Acid, Venous 5.1 (HH) 0.5 - 1.9 mmol/L  Troponin I (High Sensitivity)     Status: None   Collection Time: 05/15/19  4:24 PM  Result Value Ref Range   Troponin I (High Sensitivity) 5 <18 ng/L  SARS CORONAVIRUS 2 (TAT 6-24 HRS) Nasopharyngeal Nasopharyngeal Swab     Status: None   Collection Time: 05/15/19  5:44 PM   Specimen: Nasopharyngeal Swab  Result Value Ref Range   SARS Coronavirus 2 NEGATIVE NEGATIVE  CBC     Status: Abnormal   Collection Time: 05/15/19  6:39 PM  Result Value Ref  Range   WBC 10.5 4.0 - 10.5 K/uL   RBC 3.33 (L) 4.22 - 5.81 MIL/uL   Hemoglobin 12.1 (L) 13.0 - 17.0 g/dL   HCT 36.7 (L) 39.0 - 52.0 %   MCV 110.2 (H) 80.0 - 100.0 fL   MCH 36.3 (H) 26.0 - 34.0 pg   MCHC 33.0 30.0 - 36.0 g/dL   RDW 13.7 11.5 - 15.5 %   Platelets 108 (L) 150 - 400 K/uL   nRBC 0.0 0.0 - 0.2 %  Basic metabolic panel     Status: Abnormal   Collection Time: 05/16/19 12:07 AM  Result Value Ref Range   Sodium 131 (L) 135 - 145 mmol/L   Potassium 3.5 3.5 - 5.1 mmol/L   Chloride 101 98 - 111 mmol/L   CO2 18 (L) 22 - 32 mmol/L   Glucose, Bld 111 (H) 70 - 99 mg/dL   BUN 20 8 - 23 mg/dL   Creatinine, Ser 0.98 0.61 - 1.24 mg/dL   Calcium 8.1 (L) 8.9 - 10.3 mg/dL   GFR calc non Af Amer >60 >60  mL/min   GFR calc Af Amer >60 >60 mL/min   Anion gap 12 5 - 15  CBC     Status: Abnormal   Collection Time: 05/16/19 12:07 AM  Result Value Ref Range   WBC 14.9 (H) 4.0 - 10.5 K/uL   RBC 2.98 (L) 4.22 - 5.81 MIL/uL   Hemoglobin 10.6 (L) 13.0 - 17.0 g/dL   HCT 31.3 (L) 39.0 - 52.0 %   MCV 105.0 (H) 80.0 - 100.0 fL   MCH 35.6 (H) 26.0 - 34.0 pg   MCHC 33.9 30.0 - 36.0 g/dL   RDW 13.6 11.5 - 15.5 %   Platelets 100 (L) 150 - 400 K/uL   nRBC 0.0 0.0 - 0.2 %   Recent Results (from the past 240 hour(s))  SARS CORONAVIRUS 2 (TAT 6-24 HRS) Nasopharyngeal Nasopharyngeal Swab     Status: None   Collection Time: 05/15/19  5:44 PM   Specimen: Nasopharyngeal Swab  Result Value Ref Range Status   SARS Coronavirus 2 NEGATIVE NEGATIVE Final    Comment: (NOTE) SARS-CoV-2 target nucleic acids are NOT DETECTED. The SARS-CoV-2 RNA is generally detectable in upper and lower respiratory specimens during the acute phase of infection. Negative results do not preclude SARS-CoV-2 infection, do not rule out co-infections with other pathogens, and should not be used as the sole basis for treatment or other patient management decisions. Negative results must be combined with clinical observations, patient history, and epidemiological information. The expected result is Negative. Fact Sheet for Patients: SugarRoll.be Fact Sheet for Healthcare Providers: https://www.woods-mathews.com/ This test is not yet approved or cleared by the [place] FDA and  has been authorized for detection and/or diagnosis of SARS-CoV-2 by FDA under an Emergency Use Authorization (EUA). This EUA will remain  in effect (meaning this test can be used) for the duration of the COVID-19 declaration under Section 56 4(b)(1) of the Act, 21 U.S.C. section 360bbb-3(b)(1), unless the authorization is terminated or revoked sooner. Performed at [facility], [place] [address],  [place], [place] [address]     Renal Function: Recent Labs    05/15/19 1424 05/16/19 0007  CREATININE 1.13 0.98   Estimated Creatinine Clearance: 88.1 mL/min (by C-G formula based on SCr of 0.98 mg/dL).  Radiologic Imaging: DG Chest Port 1 View  Result Date: 05/15/2019 CLINICAL DATA:  Hypotension. EXAM: PORTABLE CHEST 1 VIEW COMPARISON:  Chest  x-ray dated December 21, 2018. FINDINGS: The heart size and mediastinal contours are within normal limits. Both lungs are clear. The visualized skeletal structures are unremarkable. IMPRESSION: No active disease. Electronically Signed   By: Titus Dubin M.D.   On: 05/15/2019 14:58    I independently reviewed the above imaging studies.  Impression/Recommendations 74 year old male presenting with gross hematuria, hypotension, and tachycardia after urologic procedure yesterday.  From patient report, it sounds like he had a resection of a bladder neck contracture although I do not have the outside records available to me.  Hematuria in the catheter tubing or bag is not unexpected after this procedure.  As long as he remains hemodynamically stable and catheter is draining fine, then no urologic intervention is needed.  His hypotension and tachycardia upon admission are not explained by the hematuria given he has not needed any transfusions, has no signs of active bleeding, and responded rapidly to fluid resuscitation.  I recommend evaluation for other sources of sepsis.  Most likely etiology after urologic intervention would be urosepsis.  Therefore I recommend urine and blood cultures and empiric antibiotics until culture results are available.  - No surgical intervention indicated from urology standpoint.  - Continue Foley catheter to drainage. Continue catheter upon discharge; patient will follow up with [place] urologist for catheter removal plan in about five days. - Recommend urine culture and blood cultures. After cultures collected, start empiric  antibiotics. Recommend continuing antibiotics upon discharge and narrowing as appropriate once culture results are available.  - If patient becomes hemodynamically unstable, develops a transfusion requirement, or catheter stops draining, please advise urology.   Haskel Schroeder 05/16/2019, 7:29 AM

## 2019-05-16 NOTE — Evaluation (Signed)
Physical Therapy One Time Evaluation Patient Details Name: Steven Bradley MRN: [id number] DOB: [birth year] Today's Date: 05/16/2019   History of Present Illness  75 year old male with a history of prostate cancer, syncope, hypertension and difficulty urinating who was at the [place] today with surgery for scar tissue removal and was discharged.  Pt presenting to ED and admitted for gross hematuria and hypotension  Clinical Impression  Pt admitted with above diagnosis.  Pt currently with functional limitations due to the deficits listed below (see PT Problem List). Pt will benefit from skilled PT to increase their independence and safety with mobility to allow discharge to the venue listed below.  Pt a little unsteady with ambulating however no physical assist required.  Pt typically uses SPC however required RW today. Pt and spouse report pt has equipment at home and feel pt will return to baseline quickly upon d/c so they politely decline f/u PT.  Pt encouraged to get OOB to recliner and ambulate during hospitalization with nursing staff so maintain mobility and strength.  Since pt politely declines acute and post-acute PT, PT will sign off at this time.    Follow Up Recommendations Home health PT(however pt politely declines)    Equipment Recommendations  None recommended by PT    Recommendations for Other Services       Precautions / Restrictions Precautions Precautions: Fall      Mobility  Bed Mobility Overal bed mobility: Needs Assistance Bed Mobility: Supine to Sit;Sit to Supine     Supine to sit: Supervision;HOB elevated Sit to supine: Supervision;HOB elevated   General bed mobility comments: very effortful  Transfers Overall transfer level: Needs assistance Equipment used: Rolling walker (2 wheeled) Transfers: Sit to/from Stand Sit to Stand: Min guard         General transfer comment: min/guard for safety, cues for hand placement  Ambulation/Gait Ambulation/Gait  assistance: Min guard Gait Distance (Feet): 120 Feet Assistive device: Rolling walker (2 wheeled) Gait Pattern/deviations: Step-through pattern;Decreased stride length;Wide base of support     General Gait Details: verbal cues for posture and remaining within RW, unsteady at times however no physical assist required as pt utilized RW for UE support  Stairs            Wheelchair Mobility    Modified Rankin (Stroke Patients Only)       Balance Overall balance assessment: Needs assistance(denies recent fall hx)         Standing balance support: Bilateral upper extremity supported Standing balance-Leahy Scale: Poor Standing balance comment: requiring UE support                             Pertinent Vitals/Pain Pain Assessment: No/denies pain    Home Living Family/patient expects to be discharged to:: Private residence Living Arrangements: Spouse/significant other Available Help at Discharge: Family Type of Home: House Home Access: Ramped entrance     Home Layout: [place]: Cane - single point;Walker - 2 wheels      Prior Function Level of Independence: Independent with assistive device(s)         Comments: typically uses SPC     Hand Dominance        Extremity/Trunk Assessment        Lower Extremity Assessment Lower Extremity Assessment: Generalized weakness    Cervical / Trunk Assessment Cervical / Trunk Assessment: Normal  Communication   Communication: No difficulties  Cognition Arousal/Alertness: Awake/alert Behavior During Therapy:  WFL for tasks assessed/performed Overall Cognitive Status: Within Functional Limits for tasks assessed                                        General Comments      Exercises     Assessment/Plan    PT Assessment All further PT needs can be met in the next venue of care  PT Problem List Decreased strength;Decreased mobility;Decreased balance;Decreased  knowledge of use of DME       PT Treatment Interventions      PT Goals (Current goals can be found in the Care Plan section)  Acute Rehab PT Goals PT Goal Formulation: All assessment and education complete, DC therapy    Frequency     Barriers to discharge        Co-evaluation               AM-PAC PT "6 Clicks" Mobility  Outcome Measure Help needed turning from your back to your side while in a flat bed without using bedrails?: A Little Help needed moving from lying on your back to sitting on the side of a flat bed without using bedrails?: A Little Help needed moving to and from a bed to a chair (including a wheelchair)?: A Little Help needed standing up from a chair using your arms (e.g., wheelchair or bedside chair)?: A Little Help needed to walk in hospital room?: A Little Help needed climbing 3-5 steps with a railing? : A Little 6 Click Score: 18    End of Session Equipment Utilized During Treatment: Gait belt Activity Tolerance: Patient tolerated treatment well Patient left: in bed;with call bell/phone within reach;with bed alarm set;with family/visitor present Nurse Communication: Mobility status PT Visit Diagnosis: Difficulty in walking, not elsewhere classified (R26.2)    Time: 4259-5638 PT Time Calculation (min) (ACUTE ONLY): 17 min   Charges:   PT Evaluation $PT Eval Low Complexity: 1 Low         Kati PT, DPT Acute Rehabilitation Services Office: [phone]  Trena Platt 05/16/2019, 4:05 PM

## 2019-05-17 DIAGNOSIS — N390 Urinary tract infection, site not specified: Secondary | ICD-10-CM

## 2019-05-17 LAB — CBC WITH DIFFERENTIAL/PLATELET
Abs Immature Granulocytes: 1.32 10*3/uL — ABNORMAL HIGH (ref 0.00–0.07)
Basophils Absolute: 0.1 10*3/uL (ref 0.0–0.1)
Basophils Relative: 0 %
Eosinophils Absolute: 0.1 10*3/uL (ref 0.0–0.5)
Eosinophils Relative: 0 %
HCT: 31.5 % — ABNORMAL LOW (ref 39.0–52.0)
Hemoglobin: 10.9 g/dL — ABNORMAL LOW (ref 13.0–17.0)
Immature Granulocytes: 8 %
Lymphocytes Relative: 8 %
Lymphs Abs: 1.3 10*3/uL (ref 0.7–4.0)
MCH: 36.5 pg — ABNORMAL HIGH (ref 26.0–34.0)
MCHC: 34.6 g/dL (ref 30.0–36.0)
MCV: 105.4 fL — ABNORMAL HIGH (ref 80.0–100.0)
Monocytes Absolute: 1.3 10*3/uL — ABNORMAL HIGH (ref 0.1–1.0)
Monocytes Relative: 8 %
Neutro Abs: 12.7 10*3/uL — ABNORMAL HIGH (ref 1.7–7.7)
Neutrophils Relative %: 76 %
Platelets: 107 10*3/uL — ABNORMAL LOW (ref 150–400)
RBC: 2.99 MIL/uL — ABNORMAL LOW (ref 4.22–5.81)
RDW: 13.9 % (ref 11.5–15.5)
WBC: 16.8 10*3/uL — ABNORMAL HIGH (ref 4.0–10.5)
nRBC: 0 % (ref 0.0–0.2)

## 2019-05-17 LAB — BASIC METABOLIC PANEL
Anion gap: 9 (ref 5–15)
BUN: 21 mg/dL (ref 8–23)
CO2: 22 mmol/L (ref 22–32)
Calcium: 8.6 mg/dL — ABNORMAL LOW (ref 8.9–10.3)
Chloride: 102 mmol/L (ref 98–111)
Creatinine, Ser: 0.84 mg/dL (ref 0.61–1.24)
GFR calc Af Amer: >60 mL/min (ref >60)
GFR calc non Af Amer: >60 mL/min (ref >60)
Glucose, Bld: 105 mg/dL — ABNORMAL HIGH (ref 70–99)
Potassium: 3.4 mmol/L — ABNORMAL LOW (ref 3.5–5.1)
Sodium: 133 mmol/L — ABNORMAL LOW (ref 135–145)

## 2019-05-17 LAB — URINALYSIS, ROUTINE W REFLEX MICROSCOPIC
Glucose, UA: NEGATIVE mg/dL
Ketones, ur: NEGATIVE mg/dL
Nitrite: NEGATIVE
Protein, ur: >300 mg/dL — AB
Specific Gravity, Urine: 1.025 (ref 1.005–1.030)
pH: 6 (ref 5.0–8.0)

## 2019-05-17 LAB — URINALYSIS, MICROSCOPIC (REFLEX): Squamous Epithelial / HPF: NONE SEEN (ref 0–5)

## 2019-05-17 LAB — MAGNESIUM: Magnesium: 1.8 mg/dL (ref 1.7–2.4)

## 2019-05-17 MED ORDER — SODIUM CHLORIDE 0.9 % IV SOLN
1.0000 g | INTRAVENOUS | Status: DC
  Administered 2019-05-17 – 2019-05-18 (×2): 1 g via INTRAVENOUS
  Filled 2019-05-17 (×2): qty 1

## 2019-05-17 MED ORDER — POTASSIUM CHLORIDE CRYS ER 20 MEQ PO TBCR
20.0000 meq | EXTENDED_RELEASE_TABLET | Freq: Once | ORAL | Status: AC
  Administered 2019-05-17: 20 meq via ORAL
  Filled 2019-05-17: qty 1

## 2019-05-17 NOTE — TOC Initial Note (Signed)
Transition of Care [facility]) - Initial/Assessment Note    Patient Details  Name: Steven Bradley MRN: [id number] Date of Birth: 06-14-1945  [facility] CM/SW Contact:    Dessa Phi, RN Phone Number: 05/17/2019, 2:44 PM  Clinical Narrative: Patient/spouse ecline HHPT-they have everything they need. They would like to do an advanced directive-will inform chaplain to assist.No further CM needs.                  Expected Discharge Plan: Home/Self Care Barriers to Discharge: Continued Medical Work up   Patient Goals and CMS Choice Patient states their goals for this hospitalization and ongoing recovery are:: go home      Expected Discharge Plan and Services Expected Discharge Plan: Home/Self Care   Discharge Planning Services: CM Consult                               HH Arranged: Refused HH          Prior Living Arrangements/Services   Lives with:: Spouse Patient language and need for interpreter reviewed:: Yes Do you feel safe going back to the place where you live?: Yes      Need for Family Participation in Patient Care: No (Comment) Care giver support system in place?: Yes (comment) Current home services: DME(cane,rw) Criminal Activity/Legal Involvement Pertinent to Current Situation/Hospitalization: No - Comment as needed  Activities of Daily Living Home Assistive Devices/Equipment: Eyeglasses, CPAP ADL Screening (condition at time of admission) Patient's cognitive ability adequate to safely complete daily activities?: No Is the patient deaf or have difficulty hearing?: No Does the patient have difficulty seeing, even when wearing glasses/contacts?: No Does the patient have difficulty concentrating, remembering, or making decisions?: No Patient able to express need for assistance with ADLs?: No Does the patient have difficulty dressing or bathing?: No Independently performs ADLs?: Yes (appropriate for developmental age) Does the patient have  difficulty walking or climbing stairs?: No Weakness of Legs: None Weakness of Arms/Hands: None  Permission Sought/Granted Permission sought to share information with : Case Manager Permission granted to share information with : Yes, Verbal Permission Granted              Emotional Assessment Appearance:: Appears stated age Attitude/Demeanor/Rapport: Gracious Affect (typically observed): Accepting Orientation: : Oriented to Self, Oriented to Place, Oriented to  Time, Oriented to Situation Alcohol / Substance Use: Not Applicable Psych Involvement: No (comment)  Admission diagnosis:  Hypokalemia [E87.6] Lactic acidosis [E87.2] Hypotension [I95.9] Hypotension, unspecified hypotension type [I95.9] Patient Active Problem List   Diagnosis Date Noted  . Hypotension 05/15/2019  . Tachycardia 05/15/2019  . History of bladder surgery 05/15/2019  . Leukopenia 05/15/2019  . Hypovolemia 05/15/2019  . Gross hematuria 05/15/2019  . Gout of multiple sites 12/22/2018  . Macrocytosis 12/21/2018  . COVID-19 virus infection 12/21/2018  . Severe sepsis ([place]) 06/04/2016  . Bacteremia due to Gram-positive bacteria 06/04/2016  . Acute pyelonephritis 06/02/2016  . Hyperlipemia 06/01/2016  . GERD (gastroesophageal reflux disease) 06/01/2016  . Nausea and vomiting 06/01/2016  . Nephrotic syndrome 12/29/2013  . Purpura ([place]) 12/29/2013  . Hypokalemia 12/29/2013  . Hyponatremia 12/29/2013  . Thrombocytopenia ([place]) 12/29/2013  . Essential hypertension 12/29/2013  . Elevated LFTs 12/29/2013   PCP:  Clinic, [place], [address] [place] [address] Phone: [phone] Fax: [phone]     Social Determinants of  Health (SDOH) Interventions    Readmission Risk Interventions No flowsheet data found.

## 2019-05-17 NOTE — Progress Notes (Addendum)
PROGRESS NOTE    Steven Bradley    Code Status: Full Code  [id number] DOB: [birth year] DOA: 05/15/2019 LOS: 1 days  PCP: [facility] CC:  Chief Complaint  Patient presents with  . Hypotension       Hospital Summary   This is a 74 year old male with history of hypertension, syncope hyper lipidemia, gout, DJD, prostate cancer s/p prostatectomy 4 years ago who was at the [place] early 3/2 for bladder procedure (resection of bladder neck contracture?)  And was DC'd home.  Once at home he felt weak and nauseated and brought to the ED with complaints of dizziness.  SBP in the low 90s then dropped to 60s.  Patient received 2 L NS bolus with improvement in BPs.  HR 110 then 140s and then improved.  Remained on room air.  Admitted for hypotension suspected from hypovolemia and possible shock with concern for adrenal insufficiency.  He was started on stress dose steroids.  Also noted to have hematuria status post procedure and urology was consulted.    3/3: urology did not recommend further urologic procedure and recommended continuing Foley catheter for drainage and continuing at discharge.  Recommended urine and blood cultures and starting empiric antibiotics.  A & P   Principal Problem:   Hypotension Active Problems:   Essential hypertension   Tachycardia   History of bladder surgery   Leukopenia   Hypovolemia   Gross hematuria   1. Hypotension and tachycardia, likely from hypovolemia vs. urologic procedure possibly from bacterial translocation during procedure a. Vitals normalized b. Unlikely shock given improvement with IV fluids. Unlikely adrenal insufficiency  c. Though leukocytosis initially thought to be steroid-induced, it has increased today despite no steroids.  Concerning for infection.  UA positive for UTI d. will start on ceftriaxone e. Follow-up urine and blood cultures 2. UTI with history of UTIs a. will give IV antibiotics and follow-up WBC in a.m. 3.  Hypokalemia a. Replete 4. Hypomagnesemia a. Resolved with IV supplement 5. Hyponatremia, stable 6. Macrocytic anemia with thrombocytopenia a. Check CBC with differential in a.m. 7. Hematuria secondary to urologic procedure a. Follow-up outpatient b. Continue Foley at discharge 8. Hypertension a. Continue holding Norvasc and Lasix for now 9. Hyperlipidemia on statin 10. History of gout flare, not active 11. Prostate cancer s/p prostatectomy several years ago  DVT prophylaxis: SCD Family Communication: discussed with wife at bedside Disposition Plan:   Patient came from: Home                                                                                          Anticipated d/c place: Home, patient declining home health PT Barriers to d/c: Needs inpatient for IV antibiotics, follow-up on WBC in a.m. and clinical status, if improving overall will discharge on oral antibiotics Pressure injury documentation    None  Consultants  Urology  Procedures  None  Antibiotics   Anti-infectives (From admission, onward)   Start     Dose/Rate Route Frequency Ordered Stop   05/17/19 1200  cefTRIAXone (ROCEPHIN) 1 g in sodium chloride 0.9 % 100 mL IVPB     1 g  200 mL/hr over 30 Minutes Intravenous Every 24 hours 05/17/19 1051          Subjective   Patient seen and examined at bedside no acute distress and resting comfortably.  No events overnight.  Tolerating diet.  Denies any chest pain, shortness of breath, fever, nausea, vomiting, urinary complaints.  Admits to having bowel movement.  Otherwise ROS negative   Objective   Vitals:   05/16/19 0500 05/16/19 1317 05/16/19 2000 05/17/19 0414  BP:  131/83 (!) 116/92 119/85  Pulse:  85 90 71  Resp:  14 20   Temp:  (!) 97.5 F (36.4 C) 97.6 F (36.4 C) 97.6 F (36.4 C)  TempSrc:  Oral Oral Oral  SpO2:  96% 97% 94%  Weight: 99 kg     Height:        Intake/Output Summary (Last 24 hours) at 05/17/2019 1308 Last data filed at  05/17/2019 0500 Gross per 24 hour  Intake 1046.98 ml  Output 1060 ml  Net -13.02 ml   Filed Weights   05/15/19 1415 05/15/19 1812 05/16/19 0500  Weight: 100.2 kg 95.9 kg 99 kg    Examination:  Physical Exam Vitals and nursing note reviewed.  Constitutional:      General: He is not in acute distress. HENT:     Head: Normocephalic.     Mouth/Throat:     Mouth: Mucous membranes are moist.  Eyes:     Conjunctiva/sclera: Conjunctivae normal.  Cardiovascular:     Rate and Rhythm: Normal rate and regular rhythm.  Pulmonary:     Effort: Pulmonary effort is normal.     Breath sounds: Normal breath sounds.  Abdominal:     General: Abdomen is flat. There is no distension.  Genitourinary:    Comments: Foley draining red tinged urine with some sediment Musculoskeletal:        General: No swelling or tenderness.  Skin:    Coloration: Skin is not pale.  Neurological:     Mental Status: He is alert.  Psychiatric:        Mood and Affect: Mood normal.        Behavior: Behavior normal.     Data Reviewed: I have personally reviewed following labs and imaging studies  CBC: Recent Labs  Lab 05/15/19 1424 05/15/19 1839 05/16/19 0007 05/17/19 0457  WBC 2.0* 10.5 14.9* 16.8*  NEUTROABS 1.8  --   --  12.7*  HGB 13.1 12.1* 10.6* 10.9*  HCT 38.1* 36.7* 31.3* 31.5*  MCV 105.2* 110.2* 105.0* 105.4*  PLT 134* 108* 100* [id number]*   Basic Metabolic Panel: Recent Labs  Lab 05/15/19 1424 05/16/19 0007 05/16/19 1653 05/16/19 2155 05/17/19 0457  NA 136 131* 134*  --  133*  K 2.7* 3.5 3.8  --  3.4*  CL 98 101 102  --  102  CO2 19* 18* 21*  --  22  GLUCOSE 105* 111* 130*  --  105*  BUN 24* 20 20  --  21  CREATININE 1.13 0.98 0.88  --  0.84  CALCIUM 8.6* 8.1* 8.1*  --  8.6*  MG  --   --  0.9* 2.0 1.8   GFR: Estimated Creatinine Clearance: 102.8 mL/min (by C-G formula based on SCr of 0.84 mg/dL). Liver Function Tests: Recent Labs  Lab 05/15/19 1424  AST 64*  ALT 24  ALKPHOS 104   BILITOT 1.5*  PROT 8.5*  ALBUMIN 3.9   No results for input(s): LIPASE, AMYLASE in the last 168 hours.  No results for input(s): AMMONIA in the last 168 hours. Coagulation Profile: Recent Labs  Lab 05/15/19 1424  INR 1.1   Cardiac Enzymes: No results for input(s): CKTOTAL, CKMB, CKMBINDEX, TROPONINI in the last 168 hours. BNP (last 3 results) No results for input(s): PROBNP in the last 8760 hours. HbA1C: No results for input(s): HGBA1C in the last 72 hours. CBG: No results for input(s): GLUCAP in the last 168 hours. Lipid Profile: No results for input(s): CHOL, HDL, LDLCALC, TRIG, CHOLHDL, LDLDIRECT in the last 72 hours. Thyroid Function Tests: No results for input(s): TSH, T4TOTAL, FREET4, T3FREE, THYROIDAB in the last 72 hours. Anemia Panel: No results for input(s): VITAMINB12, FOLATE, FERRITIN, TIBC, IRON, RETICCTPCT in the last 72 hours. Sepsis Labs: Recent Labs  Lab 05/15/19 1440  LATICACIDVEN 5.1*    Recent Results (from the past 240 hour(s))  SARS CORONAVIRUS 2 (TAT 6-24 HRS) Nasopharyngeal Nasopharyngeal Swab     Status: None   Collection Time: 05/15/19  5:44 PM   Specimen: Nasopharyngeal Swab  Result Value Ref Range Status   SARS Coronavirus 2 NEGATIVE NEGATIVE Final    Comment: (NOTE) SARS-CoV-2 target nucleic acids are NOT DETECTED. The SARS-CoV-2 RNA is generally detectable in upper and lower respiratory specimens during the acute phase of infection. Negative results do not preclude SARS-CoV-2 infection, do not rule out co-infections with other pathogens, and should not be used as the sole basis for treatment or other patient management decisions. Negative results must be combined with clinical observations, patient history, and epidemiological information. The expected result is Negative. Fact Sheet for Patients: SugarRoll.be Fact Sheet for Healthcare Providers: https://www.woods-mathews.com/ This test is not  yet approved or cleared by the [place] FDA and  has been authorized for detection and/or diagnosis of SARS-CoV-2 by FDA under an Emergency Use Authorization (EUA). This EUA will remain  in effect (meaning this test can be used) for the duration of the COVID-19 declaration under Section 56 4(b)(1) of the Act, 21 U.S.C. section 360bbb-3(b)(1), unless the authorization is terminated or revoked sooner. Performed at [facility], [place] [address] [place], [place] [address]   Culture, blood (routine x 2)     Status: None (Preliminary result)   Collection Time: 05/16/19 11:33 AM   Specimen: BLOOD  Result Value Ref Range Status   Specimen Description   Final    BLOOD RIGHT HAND Performed at [facility] [address] [place], [place] [address]    Special Requests   Final    BOTTLES DRAWN AEROBIC ONLY Blood Culture adequate volume Performed at [facility] [address], [place], [place] [address]    Culture   Final    NO GROWTH < 24 HOURS Performed at [facility] [address], [place], [place] [address]    Report Status PENDING  Incomplete  Culture, blood (routine x 2)     Status: None (Preliminary result)   Collection Time: 05/16/19 11:37 AM   Specimen: BLOOD  Result Value Ref Range Status   Specimen Description   Final    BLOOD LEFT ANTECUBITAL Performed at [facility] [address], [place], [place] [address]    Special Requests   Final    BOTTLES DRAWN AEROBIC ONLY Blood Culture adequate volume Performed at [facility] [address] [place], [place] [address]    Culture   Final    NO GROWTH < 24 HOURS Performed at [facility],  [address], [place], [place] [address]    Report Status PENDING  Incomplete         Radiology Studies: DG Chest Port 1 View  Result Date: 05/15/2019 CLINICAL DATA:  Hypotension. EXAM: PORTABLE CHEST 1 VIEW COMPARISON:  Chest  x-ray dated December 21, 2018. FINDINGS: The heart size and mediastinal contours are within normal limits. Both lungs are clear. The visualized skeletal structures are unremarkable. IMPRESSION: No active disease. Electronically Signed   By: Titus Dubin M.D.   On: 05/15/2019 14:58        Scheduled Meds: . atorvastatin  40 mg Oral QHS  . Chlorhexidine Gluconate Cloth  6 each Topical Daily  . gabapentin  100 mg Oral BID  . multivitamin with minerals  1 tablet Oral Daily  . pantoprazole  40 mg Oral Daily  . sertraline  100 mg Oral Daily  . traZODone  100 mg Oral QHS   Continuous Infusions: . cefTRIAXone (ROCEPHIN)  IV       Time spent: 27 minutes with over 50% of the time coordinating the patient's care    Harold Hedge, DO [facility] Pager [phone]  Call night coverage person covering after 7pm

## 2019-05-17 NOTE — Progress Notes (Signed)
Chaplain received page from nursing regarding Advance Directives.   Assessed need with patient and spouse at bedside.  Pt reports [facility] has been encouraging him to have a living will.  Chaplain provided education around living will.  Pt states he does not wish to complete living will.   Rather, he wishes to complete financial will and durable power of attorney.  Understands chaplain cannot provide this service.   Pt understands process for completing living will and has paperwork.

## 2019-05-18 LAB — BASIC METABOLIC PANEL
Anion gap: 8 (ref 5–15)
BUN: 17 mg/dL (ref 8–23)
CO2: 21 mmol/L — ABNORMAL LOW (ref 22–32)
Calcium: 9 mg/dL (ref 8.9–10.3)
Chloride: 104 mmol/L (ref 98–111)
Creatinine, Ser: 0.75 mg/dL (ref 0.61–1.24)
GFR calc Af Amer: >60 mL/min (ref >60)
GFR calc non Af Amer: >60 mL/min (ref >60)
Glucose, Bld: 98 mg/dL (ref 70–99)
Potassium: 3.7 mmol/L (ref 3.5–5.1)
Sodium: 133 mmol/L — ABNORMAL LOW (ref 135–145)

## 2019-05-18 LAB — CBC
HCT: 31.6 % — ABNORMAL LOW (ref 39.0–52.0)
Hemoglobin: 10.6 g/dL — ABNORMAL LOW (ref 13.0–17.0)
MCH: 35.8 pg — ABNORMAL HIGH (ref 26.0–34.0)
MCHC: 33.5 g/dL (ref 30.0–36.0)
MCV: 106.8 fL — ABNORMAL HIGH (ref 80.0–100.0)
Platelets: 103 10*3/uL — ABNORMAL LOW (ref 150–400)
RBC: 2.96 MIL/uL — ABNORMAL LOW (ref 4.22–5.81)
RDW: 14 % (ref 11.5–15.5)
WBC: 13 10*3/uL — ABNORMAL HIGH (ref 4.0–10.5)
nRBC: 0 % (ref 0.0–0.2)

## 2019-05-18 LAB — MAGNESIUM: Magnesium: 1.5 mg/dL — ABNORMAL LOW (ref 1.7–2.4)

## 2019-05-18 MED ORDER — SULFAMETHOXAZOLE-TRIMETHOPRIM 400-80 MG PO TABS: 1.0000 | ORAL_TABLET | Freq: Two times a day (BID) | ORAL | 0 refills | Status: AC

## 2019-05-18 MED ORDER — MAGNESIUM SULFATE 4 GM/100ML IV SOLN
4.0000 g | Freq: Once | INTRAVENOUS | Status: AC
  Administered 2019-05-18: 4 g via INTRAVENOUS
  Filled 2019-05-18: qty 100

## 2019-05-18 NOTE — Discharge Summary (Signed)
Physician Discharge Summary  Steven Bradley [id number] DOB: 01-Feb-1946   PCP: [facility]  Admit date: 05/15/2019 Discharge date: 05/18/2019 Length of Stay: 2 days   Code Status: Full Code  Admitted From:  Home Discharged to:   [place]:  None  Equipment/Devices:  None Discharge Condition:  Stable  Recommendations for Outpatient Follow-up   1. Follow up with [facility]rologist early next week for Foley removal  2. Follow up BMP/CBC/Mg  3. To complete a total of 7 days antibiotics for UTI (DC'd with Bactrim) 4. Urine culture not back at time of discharge, will need to follow up and adjust antibiotics if necessary  Hospital Summary  This is a 74 year old male with history of hypertension, syncope hyper lipidemia, gout, DJD, prostate cancer s/p prostatectomy 4 years ago who was at the [facility] early 3/2 for bladder procedure (resection of bladder neck contracture?)  And was DC'd home.  Once at home he felt weak and nauseated and brought to the ED with complaints of dizziness.  SBP in the low 90s then dropped to 60s.  Patient received 2 L NS bolus with improvement in BPs.  HR 110 then 140s and then improved.  Remained on room air.  Admitted for hypotension suspected from hypovolemia and possible shock with concern for adrenal insufficiency.  He was started on stress dose steroids.  Also noted to have hematuria status post procedure and urology was consulted.    3/3: urology did not recommend further urologic procedure and recommended continuing Foley catheter for drainage and continuing at discharge.  Recommended urine and blood cultures and starting empiric antibiotics.  3/5: Improved with Ceftriaxone. Discharged with Bactrim for total 7 days antibiotics and with foley catheter  A & P   Principal Problem:   Hypotension Active Problems:   Essential hypertension   Tachycardia   History of bladder surgery   Leukopenia   Hypovolemia   Gross hematuria    1. Hypotension  and tachycardia, likely from hypovolemia vs. urologic procedure possibly from bacterial translocation during procedure a. Vitals normalized b. Given IV steroids on admission due to concern of adrenal insufficiency as patient received prednisone over a month ago.  This was discontinued.  Unlikely shock given improvement with IV fluids. Unlikely adrenal insufficiency  steroids or low-dose and discontinued over a month ago for gout. c. Leukocytosis initially thought to be steroid-induced, though it increased despite no steroids. UA positive for UTI.  Started on ceftriaxone with improved WBC d. Blood cultures no growth to date e. Discharged with Bactrim for total 7 days antibiotics and will need to follow-up on urine cultures f. To follow-up with his urologist for Foley removal 2. Gram-negative rod UTI with history of UTIs a. As above 3. Hypokalemia a. Repleted 4. Hypomagnesemia a. Repleted IV prior to discharge and restarted home p.o. b. Follow-up outpatient lab 5. Hyponatremia, stable 6. Macrocytic anemia with thrombocytopenia a. Improved b. Consider further outpatient work-up 7. Hematuria secondary to urologic procedure a. Urine clear yellow in Foley bag today b. Continue Foley at discharge 8. Hypertension a. Home BP meds held during hospitalization due to hypotension b. Can restart at discharge 9. Hyperlipidemia on statin 10. History of gout flare, not active 11. Prostate cancer s/p prostatectomy several years ago   Discussed with wife over the phone   Consultants  . Urology  Procedures  . None  Antibiotics   Anti-infectives (From admission, onward)   Start     Dose/Rate Route Frequency Ordered Stop  05/18/19 0000  sulfamethoxazole-trimethoprim (BACTRIM) 400-80 MG tablet     1 tablet Oral 2 times daily 05/18/19 0826 05/24/19 2359   05/17/19 1200  cefTRIAXone (ROCEPHIN) 1 g in sodium chloride 0.9 % 100 mL IVPB     1 g 200 mL/hr over 30 Minutes Intravenous Every 24 hours  05/17/19 1051         Subjective  Patient seen and examined at bedside no acute distress and resting comfortably.  No events overnight.  Tolerating diet. In good spirits and anticipating discharge.   Denies any chest pain, shortness of breath, fever, nausea, vomiting, urinary or bowel complaints. Otherwise ROS negative    Objective   Discharge Exam: Vitals:   05/17/19 2102 05/18/19 0508  BP: 118/69 115/78  Pulse: 70 63  Resp: 20 16  Temp: 98.3 F (36.8 C) 98.1 F (36.7 C)  SpO2: 97% 98%   Vitals:   05/17/19 0414 05/17/19 1354 05/17/19 2102 05/18/19 0508  BP: 119/85 108/69 118/69 115/78  Pulse: 71 69 70 63  Resp:   20 16  Temp: 97.6 F (36.4 C) 97.6 F (36.4 C) 98.3 F (36.8 C) 98.1 F (36.7 C)  TempSrc: Oral Oral Oral Oral  SpO2: 94% 94% 97% 98%  Weight:    96.2 kg  Height:        Physical Exam Vitals and nursing note reviewed.  Constitutional:      Appearance: Normal appearance.  HENT:     Head: Normocephalic and atraumatic.  Eyes:     Conjunctiva/sclera: Conjunctivae normal.  Cardiovascular:     Rate and Rhythm: Normal rate and regular rhythm.  Pulmonary:     Effort: Pulmonary effort is normal.     Breath sounds: Normal breath sounds.  Abdominal:     General: Abdomen is flat.     Palpations: Abdomen is soft.  Genitourinary:    Comments: Foley bag with clear yellow urine Musculoskeletal:        General: No swelling or tenderness.  Skin:    Coloration: Skin is not jaundiced or pale.  Neurological:     Mental Status: He is alert. Mental status is at baseline.  Psychiatric:        Mood and Affect: Mood normal.        Behavior: Behavior normal.       The results of significant diagnostics from this hospitalization (including imaging, microbiology, ancillary and laboratory) are listed below for reference.     Microbiology: Recent Results (from the past 240 hour(s))  SARS CORONAVIRUS 2 (TAT 6-24 HRS) Nasopharyngeal Nasopharyngeal Swab      Status: None   Collection Time: 05/15/19  5:44 PM   Specimen: Nasopharyngeal Swab  Result Value Ref Range Status   SARS Coronavirus 2 NEGATIVE NEGATIVE Final    Comment: (NOTE) SARS-CoV-2 target nucleic acids are NOT DETECTED. The SARS-CoV-2 RNA is generally detectable in upper and lower respiratory specimens during the acute phase of infection. Negative results do not preclude SARS-CoV-2 infection, do not rule out co-infections with other pathogens, and should not be used as the sole basis for treatment or other patient management decisions. Negative results must be combined with clinical observations, patient history, and epidemiological information. The expected result is Negative. Fact Sheet for Patients: SugarRoll.be Fact Sheet for Healthcare Providers: https://www.woods-mathews.com/ This test is not yet approved or cleared by the [place] FDA and  has been authorized for detection and/or diagnosis of SARS-CoV-2 by FDA under an Emergency Use Authorization (EUA). This EUA will  remain  in effect (meaning this test can be used) for the duration of the COVID-19 declaration under Section 56 4(b)(1) of the Act, 21 U.S.C. section 360bbb-3(b)(1), unless the authorization is terminated or revoked sooner. Performed at [facility], [place] [address], [place], [place] [address]   Culture, blood (routine x 2)     Status: None (Preliminary result)   Collection Time: 05/16/19 11:33 AM   Specimen: BLOOD  Result Value Ref Range Status   Specimen Description   Final    BLOOD RIGHT HAND Performed at [facility] [address] [place], [place] [address]    Special Requests   Final    BOTTLES DRAWN AEROBIC ONLY Blood Culture adequate volume Performed at [facility] [address] [place], [place] [address]    Culture   Final    NO GROWTH < 24 HOURS Performed at [facility] [address], [place], [place] [address]    Report Status PENDING  Incomplete  Culture, blood (routine x 2)     Status: None (Preliminary result)   Collection Time: 05/16/19 11:37 AM   Specimen: BLOOD  Result Value Ref Range Status   Specimen Description   Final    BLOOD LEFT ANTECUBITAL Performed at [facility] [address] [place], [place] [address]    Special Requests   Final    BOTTLES DRAWN AEROBIC ONLY Blood Culture adequate volume Performed at [facility] [address], [place], [place] [address]    Culture   Final    NO GROWTH < 24 HOURS Performed at [facility] [address] [place], [place] [address]    Report Status PENDING  Incomplete  Culture, Urine     Status: Abnormal (Preliminary result)   Collection Time: 05/16/19  4:54 PM   Specimen: Urine, Catheterized  Result Value Ref Range Status   Specimen Description   Final    URINE, CATHETERIZED Performed at [facility] [address] [place], [place] [address]    Special Requests   Final    NONE Performed at [facility], [place] [address], [place], [place] [address]    Culture (A)  Final    20,000 COLONIES/mL GRAM NEGATIVE RODS IDENTIFICATION AND SUSCEPTIBILITIES TO FOLLOW CULTURE REINCUBATED FOR BETTER GROWTH Performed at [facility], [place] [address], [place], [place] [address]    Report Status PENDING  Incomplete     Labs: BNP (last 3 results) No results for input(s): BNP in the last 8760 hours. Basic Metabolic Panel: Recent Labs  Lab 05/15/19 1424 05/16/19 0007 05/16/19 1653 05/16/19 2155 05/17/19 0457 05/18/19 0533  NA 136 131* 134*  --  133* 133*  K 2.7* 3.5 3.8  --  3.4* 3.7  CL 98 101 102  --  102 104  CO2 19* 18* 21*  --  22 21*  GLUCOSE 105* 111* 130*  --  105* 98  BUN 24* 20 20  --  21 17  CREATININE 1.13 0.98 0.88  --  0.84 0.75  CALCIUM 8.6* 8.1* 8.1*  --  8.6* 9.0  MG  --   --  0.9* 2.0 1.8 1.5*    Liver Function Tests: Recent Labs  Lab 05/15/19 1424  AST 64*  ALT 24  ALKPHOS 104  BILITOT 1.5*  PROT 8.5*  ALBUMIN 3.9   No results for input(s): LIPASE, AMYLASE in the last 168 hours.  No results for input(s): AMMONIA in the last 168 hours. CBC: Recent Labs  Lab 05/15/19 1424 05/15/19 1839 05/16/19 0007 05/17/19 0457 05/18/19 0533  WBC 2.0* 10.5 14.9* 16.8* 13.0*  NEUTROABS 1.8  --   --  12.7*  --   HGB 13.1 12.1* 10.6* 10.9* 10.6*  HCT 38.1* 36.7* 31.3* 31.5* 31.6*  MCV 105.2* 110.2* 105.0* 105.4* 106.8*  PLT 134* 108* 100* 107* 103*   Cardiac Enzymes: No results for input(s): CKTOTAL, CKMB, CKMBINDEX, TROPONINI in the last 168 hours. BNP: Invalid input(s): POCBNP CBG: No results for input(s): GLUCAP in the last 168 hours. D-Dimer No results for input(s): DDIMER in the last 72 hours. Hgb A1c No results for input(s): HGBA1C in the last 72 hours. Lipid Profile No results for input(s): CHOL, HDL, LDLCALC, TRIG, CHOLHDL, LDLDIRECT in the last 72 hours. Thyroid function studies No results for input(s): TSH, T4TOTAL, T3FREE, THYROIDAB in the last 72 hours.  Invalid input(s): FREET3 Anemia work up No results for input(s): VITAMINB12, FOLATE, FERRITIN, TIBC, IRON, RETICCTPCT in the last 72 hours. Urinalysis    Component Value Date/Time   COLORURINE ORANGE (A) 05/17/2019 1144   APPEARANCEUR HAZY (A) 05/17/2019 1144   LABSPEC 1.025 05/17/2019 1144   PHURINE 6.0 05/17/2019 1144   GLUCOSEU NEGATIVE 05/17/2019 1144   HGBUR LARGE (A) 05/17/2019 1144   BILIRUBINUR MODERATE (A) 05/17/2019 1144   KETONESUR NEGATIVE 05/17/2019 1144   PROTEINUR >300 (A) 05/17/2019 1144   UROBILINOGEN 2.0 (H) 03/28/2014 1624   NITRITE NEGATIVE 05/17/2019 1144   LEUKOCYTESUR SMALL (A) 05/17/2019 1144   Sepsis Labs Invalid input(s): PROCALCITONIN,  WBC,  LACTICIDVEN Microbiology Recent Results (from the past 240 hour(s))  SARS CORONAVIRUS 2 (TAT 6-24 HRS) Nasopharyngeal  Nasopharyngeal Swab     Status: None   Collection Time: 05/15/19  5:44 PM   Specimen: Nasopharyngeal Swab  Result Value Ref Range Status   SARS Coronavirus 2 NEGATIVE NEGATIVE Final    Comment: (NOTE) SARS-CoV-2 target nucleic acids are NOT DETECTED. The SARS-CoV-2 RNA is generally detectable in upper and lower respiratory specimens during the acute phase of infection. Negative results do not preclude SARS-CoV-2 infection, do not rule out co-infections with other pathogens, and should not be used as the sole basis for treatment or other patient management decisions. Negative results must be combined with clinical observations, patient history, and epidemiological information. The expected result is Negative. Fact Sheet for Patients: SugarRoll.be Fact Sheet for Healthcare Providers: https://www.woods-mathews.com/ This test is not yet approved or cleared by the [place] FDA and  has been authorized for detection and/or diagnosis of SARS-CoV-2 by FDA under an Emergency Use Authorization (EUA). This EUA will remain  in effect (meaning this test can be used) for the duration of the COVID-19 declaration under Section 56 4(b)(1) of the Act, 21 U.S.C. section 360bbb-3(b)(1), unless the authorization is terminated or revoked sooner. Performed at [facility], [place] [address], [place], [place] [address]   Culture, blood (routine x 2)     Status: None (Preliminary result)   Collection Time: 05/16/19 11:33 AM   Specimen: BLOOD  Result Value Ref Range Status   Specimen Description   Final    BLOOD RIGHT HAND Performed at [facility] [address], [place], [place] [address]    Special Requests   Final    BOTTLES DRAWN AEROBIC ONLY Blood Culture adequate volume Performed at [facility] [address] [place], [place] [address]    Culture  Final    NO GROWTH < 24 HOURS Performed at [facility], [place] [address], [place], [place] [address]    Report Status PENDING  Incomplete  Culture, blood (routine x 2)     Status: None (Preliminary result)   Collection Time: 05/16/19 11:37 AM   Specimen: BLOOD  Result Value Ref Range Status   Specimen Description   Final    BLOOD LEFT ANTECUBITAL Performed at [facility] [address], [place], [place] [address]    Special Requests   Final    BOTTLES DRAWN AEROBIC ONLY Blood Culture adequate volume Performed at [facility] [address], [place], [place] [address]    Culture   Final    NO GROWTH < 24 HOURS Performed at [facility] [address], [place], [place] [address]    Report Status PENDING  Incomplete  Culture, Urine     Status: Abnormal (Preliminary result)   Collection Time: 05/16/19  4:54 PM   Specimen: Urine, Catheterized  Result Value Ref Range Status   Specimen Description   Final    URINE, CATHETERIZED Performed at [facility] [address], [place], [place] [address]    Special Requests   Final    NONE Performed at [facility], [place] [address] [place], [place] [address]    Culture (A)  Final    20,000 COLONIES/mL GRAM NEGATIVE RODS IDENTIFICATION AND SUSCEPTIBILITIES TO FOLLOW CULTURE REINCUBATED FOR BETTER GROWTH Performed at [facility], [place] [address] [place],  [address]    Report Status PENDING  Incomplete    Discharge Instructions     Discharge Instructions    Continue foley catheter   Complete by: As directed    Diet - low sodium heart healthy   Complete by: As directed    Discharge instructions   Complete by: As directed    You were seen and examined in the hospital for urinary tract infection after your urologic procedure and cared for by a hospitalist.   Upon Discharge:  - take bactrim twice daily for the next 6 days - continue your foley catheter at discharge. You should  follow up with your urologist at the [facility] early next week to have it removed - Get lab work in the next week Bring all home medications to your appointment to review Request that your primary physician go over all hospital tests and procedures/radiological results at the follow up.   Please get all hospital records sent to your physician by signing a hospital release before you go home.   Read the complete instructions along with all the possible side effects for all the medicines you take and that have been prescribed to you. Take any new medicines after you have completely understood and accept all the possible adverse reactions/side effects.   If you have any questions about your discharge medications or the care you received while you were in the hospital, you can call the unit and asked to speak with the hospitalist on call. Once you are discharged, your primary care physician will handle any further medical issues. Please note that NO REFILLS for any discharge medications will be authorized, as it is imperative that you return to your primary care physician (or establish a relationship with a primary care physician if you do not have one) for your aftercare needs so that they can reassess your need for medications and monitor your lab values.  Do not drive, operate heavy machinery, perform activities at heights, swimming or participation in water activities or provide baby sitting services if your were admitted for loss of consciousness/seizures or if you are on sedating medications including, but not limited to benzodiazepines, sleep medications, narcotic pain medications, etc., until you have been cleared to do so by a medical doctor.   Do not take more than prescribed medications.   Wear a seat belt while driving.  If you have smoked or chewed Tobacco in the last 2 years please stop smoking; also stop any regular Alcohol and/or any Recreational drug use including marijuana.  If you  experience worsening of your admission symptoms or develop shortness of breath, chest pain, suicidal or homicidal thoughts or experience a life threatening emergency, you must seek medical attention immediately by calling 911 or calling your PCP immediately.   Increase activity slowly   Complete by: As directed      Allergies as of 05/18/2019      Reactions   Allopurinol Rash   Vitamin D Analogs Rash      Medication List    TAKE these medications   amLODipine 10 MG tablet Commonly known as: NORVASC Take 10 mg by mouth daily as needed (for BP >150).   atorvastatin 80 MG tablet Commonly known as: LIPITOR Take 40 mg by mouth at bedtime.   furosemide 40 MG tablet Commonly known as: LASIX Take 40 mg by mouth daily.   gabapentin 100 MG capsule Commonly known as: NEURONTIN Take 100 mg by mouth 2 (two) times daily.   HYDROcodone-acetaminophen 10-325 MG tablet Commonly known as: NORCO Take 1 tablet by mouth every 12 (twelve) hours as needed for moderate pain.   ibuprofen 200 MG tablet Commonly known as: ADVIL Take 2 tablets (400 mg total) by mouth every 6 (six) hours as needed for fever or moderate pain.   MAGnesium-Oxide 400 (241.3 Mg) MG tablet Generic drug: magnesium oxide Take 400 mg by mouth daily.   multivitamin with minerals Tabs tablet Take 1 tablet by mouth daily.   omeprazole 20 MG capsule Commonly known as: PRILOSEC Take 2 capsules (40 mg total) by mouth 2 (two) times daily before a meal.   predniSONE 5 MG tablet Commonly known as: DELTASONE Take 5 mg by mouth daily as needed (gout).   sertraline 100 MG tablet Commonly known as: ZOLOFT Take 100 mg by mouth daily.   sulfamethoxazole-trimethoprim 400-80 MG tablet Commonly known as: Bactrim Take 1 tablet by mouth 2 (two) times daily for 6 days.   traZODone 100 MG tablet Commonly known as: DESYREL Take 100 mg by mouth at bedtime.   Visine Advanced Relief 0.05-0.1-1-1 % Soln Generic drug:  Tetrahydroz-Dextran-PEG-Povid Place 1 drop into both eyes daily as needed (for irritated eyes).       Allergies  Allergen Reactions  . Allopurinol Rash  . Vitamin D Analogs Rash    Time coordinating discharge: Over 30 minutes   SIGNED:   Harold Hedge, D.O. [facility] Pager: [phone]  05/18/2019, 11:32 AM

## 2019-05-21 LAB — CULTURE, BLOOD (ROUTINE X 2)
Culture: NO GROWTH
Culture: NO GROWTH
Special Requests: ADEQUATE
Special Requests: ADEQUATE

## 2019-05-22 LAB — URINE CULTURE: Culture: 20000 — AB

## 2019-05-22 LAB — CARBAPENEM RESISTANCE PANEL
Carba Resistance IMP Gene: NOT DETECTED
Carba Resistance KPC Gene: NOT DETECTED
Carba Resistance NDM Gene: NOT DETECTED
Carba Resistance OXA48 Gene: NOT DETECTED
Carba Resistance VIM Gene: NOT DETECTED

## 2019-06-14 DIAGNOSIS — E872 Acidosis, unspecified: Secondary | ICD-10-CM

## 2019-08-31 NOTE — H&P (Signed)
[facility] History & Physical  Steven Bradley 05/15/2019  Chief Complaint:   HPI: The patient is a 74 y.o. year-old w/ hx of HTN, syncope, HL, gout, DJD and prostate cancer who was at the [place] earlier today for a bladder procedure "to remove some scar tissue" and was dc'd home afterwards. When he got home he was very weak and nauseated so was brought to the ED.  In ED pt c/o dizziness. BP's were low in the 90's then dropped to the 60's. Pt got 2L NS bolus w/ sig improvement in BP's. Heart rates were 110 then 140's, but now are coming down again to 115. RA sat 97%.  No fevers, chills, cough or SOB.  Asked to see for admission.   Pt seen in [place] room.  Not feeling any worse, maybe a bit better.  Still feels poorly. No abd pain, CP or SOB.  HD 118, BP now 126/ 65.  Patient states he had prostate "completely removed" 4 yrs ago for prostate ca and that it has not recurred.  He was having voiding problems off and on and they did the procedure at the [place] today to "remove scar tissue" to improve his voiding. Did not have any hematuria pre-op. Not on any blood thinners, asa or plavix.    ROS  denies CP  no joint pain   no HA  no blurry vision  no rash  no diarrhea     Past Medical History  Past Medical History:  Diagnosis Date  . Arthritis   . Enlarged prostate   . GERD (gastroesophageal reflux disease)   . Gout    Patient doesn't recall having gout but was prescribed allopurinol for ankle swelling.  Marland Kitchen Hyperlipidemia   . Hypertension   . Prostate cancer [facility]) June 2015  . PTSD (post-traumatic stress disorder)   . Syncope    Negative outpatient workup.  . Vitamin D deficiency    Past Surgical History  Past Surgical History:  Procedure Laterality Date  . PROSTATE BIOPSY  June 2015  . PROSTATE SURGERY    . Right hand surgery    . Scrapnel removal from scalp     During [place] war   Family History No family history on file. Social History  reports that  he has never smoked. He has never used smokeless tobacco. He reports current alcohol use. He reports that he does not use drugs. Allergies  Allergies  Allergen Reactions  . Allopurinol Rash  . Vitamin D Analogs Rash   Home medications Prior to Admission medications   Medication Sig Start Date End Date Taking? Authorizing Provider  amLODipine (NORVASC) 10 MG tablet Take 10 mg by mouth daily as needed (for BP >150).    Yes [provider]  atorvastatin (LIPITOR) 80 MG tablet Take 40 mg by mouth at bedtime.   Yes [provider]  furosemide (LASIX) 40 MG tablet Take 40 mg by mouth daily.   Yes [provider]  gabapentin (NEURONTIN) 100 MG capsule Take 100 mg by mouth 2 (two) times daily.   Yes [provider]  HYDROcodone-acetaminophen (NORCO) 10-325 MG per tablet Take 1 tablet by mouth every 12 (twelve) hours as needed for moderate pain.    Yes [provider]  ibuprofen (ADVIL) 200 MG tablet Take 2 tablets (400 mg total) by mouth every 6 (six) hours as needed for fever or moderate pain. 12/24/18  Yes [name], MD  magnesium oxide (  MAGNESIUM-OXIDE) 400 (241.3 Mg) MG tablet Take 400 mg by mouth daily.   Yes [provider]  Multiple Vitamin (MULTIVITAMIN WITH MINERALS) TABS tablet Take 1 tablet by mouth daily.   Yes [provider]  omeprazole (PRILOSEC) 20 MG capsule Take 2 capsules (40 mg total) by mouth 2 (two) times daily before a meal. 12/24/18  Yes [name], MD  predniSONE (DELTASONE) 5 MG tablet Take 5 mg by mouth daily as needed (gout).   Yes [provider]  sertraline (ZOLOFT) 100 MG tablet Take 100 mg by mouth daily.   Yes [provider]  Tetrahydroz-Dextran-PEG-Povid (VISINE ADVANCED RELIEF) 0.05-0.1-1-1 % SOLN Place 1 drop into both eyes daily as needed (for irritated eyes).    Yes [provider]  traZODone (DESYREL) 100 MG tablet Take 100 mg by mouth at bedtime.   Yes  [provider]       Exam Gen alert, lying flat, pleaseant No rash, cyanosis or gangrene Sclera anicteric, throat clear   No jvd or bruits Chest clear bilat to bases RRR no MRG Abd soft ntnd no mass or ascites +bs GU normal male w/ foley draining dark red bloody urine in small amts, there is about 400 cc dark red bloody urine in the bag MS no joint effusions or deformity Ext no LE or UE edema, no wounds or ulcers Neuro is alert, Ox 3 , nf    Home meds:  - amlodipine 10/ furosemide 40  - lipitor 40/ omeprazole 20 bid  - sertraline 100/ trazodone 100/ hydrocodone-aceta prn/ gabapentin 100 bid  - prednisone 5 qd prn gout  - prn's/ vitamins/ supplements     Assessment/ Plan: 1. Hypotension/ shock - suspect combination of hypovolemia from bleeding possibly, on home lasix and npo, etc.., also anesthesia.  BP's better after 2L NS in ED, still tachy though. Will repeat Hb (13 in ED) and rebolus 1-2 L . Does not appear septic. Long shot but could be adrenal insuff as stopped po pred 1 mo ago after some time on it for gout flares, will give HC IV 50 tid.  2. Hematuria - sp urology procedure at [facility] this am.  Have d/w urology, they recommend flushing catheter to make sure is not occluded. They will see in am, or sooner if needed if becomes hemodynamically unstable or cath not draining tonight. Repeating Hb now and at 12 MN.  3. HTN - hold norvasc and lasix for now 4. HL - statin 5. H/o gout - not active 6. Prostate cancer - hx of prostatectomy     Kelly Splinter, MD   [facility] 05/15/2019, 4:56 PM

## 2019-09-13 ENCOUNTER — Other Ambulatory Visit: Payer: Self-pay

## 2019-09-13 ENCOUNTER — Encounter (HOSPITAL_COMMUNITY): Payer: Self-pay

## 2019-09-13 ENCOUNTER — Emergency Department (HOSPITAL_COMMUNITY)
Admission: EM | Admit: 2019-09-13 | Discharge: 2019-09-13 | Disposition: A | Discharge status: Home / Self Care | Payer: No Typology Code available for payment source | Attending: Emergency Medicine | Admitting: Emergency Medicine

## 2019-09-13 DIAGNOSIS — I10 Essential (primary) hypertension: Secondary | ICD-10-CM | POA: Insufficient documentation

## 2019-09-13 DIAGNOSIS — K1379 Other lesions of oral mucosa: Secondary | ICD-10-CM

## 2019-09-13 DIAGNOSIS — K068 Other specified disorders of gingiva and edentulous alveolar ridge: Secondary | ICD-10-CM | POA: Insufficient documentation

## 2019-09-13 LAB — CBC
HCT: 40.4 % (ref 39.0–52.0)
Hemoglobin: 13.9 g/dL (ref 13.0–17.0)
MCH: 35.6 pg — ABNORMAL HIGH (ref 26.0–34.0)
MCHC: 34.4 g/dL (ref 30.0–36.0)
MCV: 103.6 fL — ABNORMAL HIGH (ref 80.0–100.0)
Platelets: 134 10*3/uL — ABNORMAL LOW (ref 150–400)
RBC: 3.9 MIL/uL — ABNORMAL LOW (ref 4.22–5.81)
RDW: 14.9 % (ref 11.5–15.5)
WBC: 7.1 10*3/uL (ref 4.0–10.5)
nRBC: 0 % (ref 0.0–0.2)

## 2019-09-13 LAB — COMPREHENSIVE METABOLIC PANEL
ALT: 36 U/L (ref 0–44)
AST: 84 U/L — ABNORMAL HIGH (ref 15–41)
Albumin: 4.5 g/dL (ref 3.5–5.0)
Alkaline Phosphatase: 108 U/L (ref 38–126)
Anion gap: 18 — ABNORMAL HIGH (ref 5–15)
BUN: 22 mg/dL (ref 8–23)
CO2: 24 mmol/L (ref 22–32)
Calcium: 8.9 mg/dL (ref 8.9–10.3)
Chloride: 96 mmol/L — ABNORMAL LOW (ref 98–111)
Creatinine, Ser: 1.12 mg/dL (ref 0.61–1.24)
GFR calc Af Amer: >60 mL/min (ref >60)
GFR calc non Af Amer: >60 mL/min (ref >60)
Glucose, Bld: 93 mg/dL (ref 70–99)
Potassium: 3.9 mmol/L (ref 3.5–5.1)
Sodium: 138 mmol/L (ref 135–145)
Total Bilirubin: 2.1 mg/dL — ABNORMAL HIGH (ref 0.3–1.2)
Total Protein: 9.7 g/dL — ABNORMAL HIGH (ref 6.5–8.1)

## 2019-09-13 LAB — PROTIME-INR
INR: 1 (ref 0.8–1.2)
Prothrombin Time: 12.8 seconds (ref 11.4–15.2)

## 2019-09-13 LAB — APTT: aPTT: 31 seconds (ref 24–36)

## 2019-09-13 NOTE — Discharge Instructions (Signed)
As discussed, yours labs are reassuring. Your platelets count if better than it normally is. Brush with a soft tooth brush. Follow-up with your dentist this week. Return to the ER for new or worsening symptoms.

## 2019-09-13 NOTE — ED Triage Notes (Signed)
Patient states he has been having blood in his mouth since yesterday. Patient states that it is enough that he has to spit. Patient denies emesis or coughing up blood. Patient denies being on blood thinners. Patient denies pain anywhere.

## 2019-09-13 NOTE — ED Notes (Signed)
Pt states bleeding starts when brushing his teeth and will typically subside after a few minutes. Pt denies difficulty or pain with chewing. Pt denies any loose teeth.

## 2019-09-13 NOTE — ED Provider Notes (Signed)
[facility] DEPT Provider Note   CSN: [id number] Arrival date & time: 09/13/19  1801     History Chief Complaint  Patient presents with  . oral bleeding    Steven Bradley is a 74 y.o. male with a past medical history significant for GERD, hyperlipidemia, hypertension, prostate cancer, vitamin D deficiency, and history of thrombocytopenia who presents to the ED due to oral bleeding for the past 2 days.  Patient states yesterday he was having oral bleeding only while brushing his teeth however today, noticed oral bleeding randomly throughout the day without any brushing. Patient notes bleeding was coming all his gums instead of a specific location. Denies any dental pain. He sees a Pharmacist, community at the [place] who he last saw last year. Denies fever and chills. Denies any bleeding disorders. He is not currently on any blood thinners. Denies fever and chills. No treatment prior to arrival.  Denies melena, hematochezia, and hematemesis.  Denies hematuria.  History obtained from patient and past medical records. No interpreter used during encounter.      Past Medical History:  Diagnosis Date  . Arthritis   . Enlarged prostate   . GERD (gastroesophageal reflux disease)   . Gout    Patient doesn't recall having gout but was prescribed allopurinol for ankle swelling.  Marland Kitchen Hyperlipidemia   . Hypertension   . Prostate cancer [facility]) June 2015  . PTSD (post-traumatic stress disorder)   . Syncope    Negative outpatient workup.  . Vitamin D deficiency     Patient Active Problem List   Diagnosis Date Noted  . Lactic acidosis   . Hypotension 05/15/2019  . Tachycardia 05/15/2019  . History of bladder surgery 05/15/2019  . Leukopenia 05/15/2019  . Hypovolemia 05/15/2019  . Gross hematuria 05/15/2019  . Gout of multiple sites 12/22/2018  . Macrocytosis 12/21/2018  . COVID-19 virus infection 12/21/2018  . Severe sepsis ([place]) 06/04/2016  . Bacteremia due to Gram-positive  bacteria 06/04/2016  . Acute pyelonephritis 06/02/2016  . Acute lower UTI 06/01/2016  . Hyperlipemia 06/01/2016  . GERD (gastroesophageal reflux disease) 06/01/2016  . Nausea and vomiting 06/01/2016  . Nephrotic syndrome 12/29/2013  . Purpura ([place]) 12/29/2013  . Hypokalemia 12/29/2013  . Hyponatremia 12/29/2013  . Thrombocytopenia ([place]) 12/29/2013  . Essential hypertension 12/29/2013  . Elevated LFTs 12/29/2013    Past Surgical History:  Procedure Laterality Date  . PROSTATE BIOPSY  June 2015  . PROSTATE SURGERY    . Right hand surgery    . Scrapnel removal from scalp     During [place] war       Family History  Problem Relation Age of Onset  . Cancer Father     Social History   Tobacco Use  . Smoking status: Never Smoker  . Smokeless tobacco: Never Used  Vaping Use  . Vaping Use: Never used  Substance Use Topics  . Alcohol use: Yes    Comment: occasionaly  . Drug use: No    Home Medications Prior to Admission medications   Medication Sig Start Date End Date Taking? Authorizing Provider  amLODipine (NORVASC) 10 MG tablet Take 10 mg by mouth daily as needed (for BP >150).     [provider]  atorvastatin (LIPITOR) 80 MG tablet Take 40 mg by mouth at bedtime.    [provider]  furosemide (LASIX) 40 MG tablet Take 40 mg by mouth daily.    [provider]  gabapentin (NEURONTIN) 100 MG capsule Take  100 mg by mouth 2 (two) times daily.    [provider]  HYDROcodone-acetaminophen (NORCO) 10-325 MG per tablet Take 1 tablet by mouth every 12 (twelve) hours as needed for moderate pain.     [provider]  ibuprofen (ADVIL) 200 MG tablet Take 2 tablets (400 mg total) by mouth every 6 (six) hours as needed for fever or moderate pain. 12/24/18   [name], MD  magnesium oxide (MAGNESIUM-OXIDE) 400 (241.3 Mg) MG tablet Take 400 mg by mouth daily.    [provider]  Multiple Vitamin (MULTIVITAMIN WITH  MINERALS) TABS tablet Take 1 tablet by mouth daily.    [provider]  omeprazole (PRILOSEC) 20 MG capsule Take 2 capsules (40 mg total) by mouth 2 (two) times daily before a meal. 12/24/18   [name], MD  predniSONE (DELTASONE) 5 MG tablet Take 5 mg by mouth daily as needed (gout).    [provider]  sertraline (ZOLOFT) 100 MG tablet Take 100 mg by mouth daily.    [provider]  Tetrahydroz-Dextran-PEG-Povid (VISINE ADVANCED RELIEF) 0.05-0.1-1-1 % SOLN Place 1 drop into both eyes daily as needed (for irritated eyes).     [provider]  traZODone (DESYREL) 100 MG tablet Take 100 mg by mouth at bedtime.    [provider]    Allergies    Allopurinol and Vitamin d analogs  Review of Systems   Review of Systems  Constitutional: Negative for chills and fever.  HENT: Positive for dental problem (oral bleeding).   Respiratory: Negative for shortness of breath.   Cardiovascular: Negative for chest pain.  All other systems reviewed and are negative.   Physical Exam Updated Vital Signs BP 118/90 (BP Location: Right Arm)   Pulse (!) 106   Temp 98.4 F (36.9 C) (Oral)   Resp 18   Ht 6\' 6"  (1.981 m)   Wt 98.9 kg   SpO2 99%   BMI 25.19 kg/m   Physical Exam Vitals and nursing note reviewed.  Constitutional:      General: He is not in acute distress.    Appearance: He is not ill-appearing.  HENT:     Head: Normocephalic.     Mouth/Throat:     Comments: Good dentition throughout.  No obvious gum bleeding.  No gingivitis.  No abscess appreciated on exam.  Tongue in normal position without protrusion.  No tenderness to palpation below tongue.  Normal phonation.  Patient tolerating oral secretions without difficulty. Eyes:     Pupils: Pupils are equal, round, and reactive to light.  Cardiovascular:     Rate and Rhythm: Normal rate and regular rhythm.     Pulses: Normal pulses.     Heart sounds: Normal heart sounds. No murmur  heard.  No friction rub. No gallop.   Pulmonary:     Effort: Pulmonary effort is normal.     Breath sounds: Normal breath sounds.  Abdominal:     General: Abdomen is flat. There is no distension.     Palpations: Abdomen is soft.     Tenderness: There is no abdominal tenderness. There is no guarding or rebound.  Musculoskeletal:     Cervical back: Neck supple.     Comments: Able to move all 4 extremities without difficulty.   Skin:    General: Skin is warm and dry.  Neurological:     General: No focal deficit present.     Mental Status: He is alert.  Psychiatric:  Mood and Affect: Mood normal.        Behavior: Behavior normal.     ED Results / Procedures / Treatments   Labs (all labs ordered are listed, but only abnormal results are displayed) Labs Reviewed  COMPREHENSIVE METABOLIC PANEL - Abnormal; Notable for the following components:      Result Value   Chloride 96 (*)    Total Protein 9.7 (*)    AST 84 (*)    Total Bilirubin 2.1 (*)    Anion gap 18 (*)    All other components within normal limits  CBC - Abnormal; Notable for the following components:   RBC 3.90 (*)    MCV 103.6 (*)    MCH 35.6 (*)    Platelets 134 (*)    All other components within normal limits  PROTIME-INR  APTT    EKG None  Radiology No results found.  Procedures Procedures (including critical care time)  Medications Ordered in ED Medications - No data to display  ED Course  I have reviewed the triage vital signs and the nursing notes.  Pertinent labs & imaging results that were available during my care of the patient were reviewed by me and considered in my medical decision making (see chart for details).  Clinical Course as of Sep 12 2109  Thu Sep 13, 2019  1948 Pulse Rate(!): 114 [CA]    Clinical Course User Index [CA] Karie Kirks   MDM Rules/Calculators/A&P                         74 year old male presents to the ED due to oral bleeding for the past  2 days.  Patient states bleeding is coming from his gums both provoked and unprovoked by brushing.  Patient has a history of thrombocytopenia.  He is not currently on any blood thinners.  Upon arrival, patient afebrile, tachycardic at 114, but otherwise stable vitals.  Physical exam reassuring.  Good dentition throughout.  No signs of gingivitis.  No abscess appreciated on exam.  No active bleeding.  Will obtain routine labs to check for platelet number. Will also obtain INR/APTT.   CBC reassuring with no leukocytosis and normal hemoglobin.  No concern for anemia.  Thrombocytopenia at 134 which appears better than baseline. Suspect bleeding from thrombocytopenia.  CMP reassuring with normal renal function.  Mild elevation in AST.  Anion gap of 18.  Suspect bleeding due to thrombocytopenia which is better than baseline.  INR and APTT within normal limits.  Instructed patient to use soft toothbrush to prevent further bleeding.  Tachycardia has improved throughout patient's ED stay.  Discussed case with Dr. Gilford Raid who evaluated patient at bedside and agrees with assessment and plan.  Instructed patient to follow-up with PCP within the next week for further evaluation of thrombocytopenia. Strict ED precautions discussed with patient. Patient states understanding and agrees to plan. Patient discharged home in no acute distress and stable vitals. Final Clinical Impression(s) / ED Diagnoses Final diagnoses:  Oral bleeding    Rx / DC Orders ED Discharge Orders    None       Karie Kirks 09/13/19 2111    Isla Pence, MD 09/13/19 [phone]

## 2020-10-03 ENCOUNTER — Encounter (HOSPITAL_COMMUNITY): Payer: Self-pay

## 2020-10-03 ENCOUNTER — Emergency Department (HOSPITAL_COMMUNITY): Payer: No Typology Code available for payment source

## 2020-10-03 ENCOUNTER — Other Ambulatory Visit: Payer: Self-pay

## 2020-10-03 ENCOUNTER — Emergency Department (HOSPITAL_COMMUNITY)
Admission: EM | Admit: 2020-10-03 | Discharge: 2020-10-03 | Disposition: A | Discharge status: Home / Self Care | Payer: No Typology Code available for payment source | Attending: Emergency Medicine | Admitting: Emergency Medicine

## 2020-10-03 DIAGNOSIS — Z8546 Personal history of malignant neoplasm of prostate: Secondary | ICD-10-CM | POA: Insufficient documentation

## 2020-10-03 DIAGNOSIS — S34101A Unspecified injury to L1 level of lumbar spinal cord, initial encounter: Secondary | ICD-10-CM | POA: Diagnosis present

## 2020-10-03 DIAGNOSIS — Z79899 Other long term (current) drug therapy: Secondary | ICD-10-CM | POA: Diagnosis not present

## 2020-10-03 DIAGNOSIS — W01198A Fall on same level from slipping, tripping and stumbling with subsequent striking against other object, initial encounter: Secondary | ICD-10-CM | POA: Insufficient documentation

## 2020-10-03 DIAGNOSIS — I10 Essential (primary) hypertension: Secondary | ICD-10-CM | POA: Diagnosis not present

## 2020-10-03 DIAGNOSIS — S32010A Wedge compression fracture of first lumbar vertebra, initial encounter for closed fracture: Secondary | ICD-10-CM | POA: Insufficient documentation

## 2020-10-03 DIAGNOSIS — S32030A Wedge compression fracture of third lumbar vertebra, initial encounter for closed fracture: Secondary | ICD-10-CM

## 2020-10-03 DIAGNOSIS — Z8616 Personal history of COVID-19: Secondary | ICD-10-CM | POA: Insufficient documentation

## 2020-10-03 DIAGNOSIS — S32020A Wedge compression fracture of second lumbar vertebra, initial encounter for closed fracture: Secondary | ICD-10-CM | POA: Diagnosis not present

## 2020-10-03 DIAGNOSIS — S0990XA Unspecified injury of head, initial encounter: Secondary | ICD-10-CM | POA: Diagnosis not present

## 2020-10-03 MED ORDER — HYDROCODONE-ACETAMINOPHEN 5-325 MG PO TABS
1.0000 | ORAL_TABLET | Freq: Once | ORAL | Status: AC
  Administered 2020-10-03: 1 via ORAL
  Filled 2020-10-03: qty 1

## 2020-10-03 NOTE — ED Triage Notes (Signed)
Fall from standing position on Sunday. Patient complaining of lower back pain. Patient states he hit head on fall Sunday and denies BT usage and -loc.

## 2020-10-03 NOTE — Discharge Instructions (Addendum)
Please follow-up with Neurosurgery in clinic in two weeks for reassessment and follow-up.  Continue taking your home opiates that were prescribed by the [place].

## 2020-10-03 NOTE — ED Notes (Signed)
Pt wife requests phone call if there is a change in pt status [phone]

## 2020-10-03 NOTE — ED Notes (Signed)
MD made aware that family would like to speak with them

## 2020-10-03 NOTE — Progress Notes (Signed)
Orthopedic Tech Progress Note Patient Details:  Steven Bradley 12-Oct-1945 [id number]  Patient ID: Steven Bradley, male   DOB: Mar 31, 1945, 75 y.o.   MRN: [id number]  Steven Bradley 10/03/2020, 3:24 PMCalled and routed TLSO brace order to [facility].

## 2020-10-03 NOTE — ED Provider Notes (Signed)
[facility] DEPT Provider Note   CSN: [id number] Arrival date & time: 10/03/20  1039     History Chief Complaint  Patient presents with   Steven Bradley is a 75 y.o. male.  HPI    75 year old male presenting to the emergency department with sharp and severe back pain and difficulty ambulating after mechanical fall on Sunday.  The patient states that he normally ambulates with the assistance of a cane and walker at home.  He states that he got out of bed and his legs "just gave out on me" resulting in a ground-level mechanical fall.  He states that he hit his head but did not lose consciousness.  The patient states that he is not currently on anticoagulation.  Since his fall he has been nonambulatory due to sharp and severe midline lumbar back pain.  He denies any fecal or urinary incontinence.  He denies any numbness or weakness in his legs bilaterally.  He he denies any radiculopathy.  He states that his ambulation is primarily limited due to his low back pain.  Past Medical History:  Diagnosis Date   Arthritis    Enlarged prostate    GERD (gastroesophageal reflux disease)    Gout    Patient doesn't recall having gout but was prescribed allopurinol for ankle swelling.   Hyperlipidemia    Hypertension    Prostate cancer [facility]) June 2015   PTSD (post-traumatic stress disorder)    Syncope    Negative outpatient workup.   Vitamin D deficiency     Patient Active Problem List   Diagnosis Date Noted   Lactic acidosis    Hypotension 05/15/2019   Tachycardia 05/15/2019   History of bladder surgery 05/15/2019   Leukopenia 05/15/2019   Hypovolemia 05/15/2019   Gross hematuria 05/15/2019   Gout of multiple sites 12/22/2018   Macrocytosis 12/21/2018   COVID-19 virus infection 12/21/2018   Severe sepsis ([place]) 06/04/2016   Bacteremia due to Gram-positive bacteria 06/04/2016   Acute pyelonephritis 06/02/2016   Acute lower UTI 06/01/2016    Hyperlipemia 06/01/2016   GERD (gastroesophageal reflux disease) 06/01/2016   Nausea and vomiting 06/01/2016   Nephrotic syndrome 12/29/2013   Purpura ([place]) 12/29/2013   Hypokalemia 12/29/2013   Hyponatremia 12/29/2013   Thrombocytopenia ([place]) 12/29/2013   Essential hypertension 12/29/2013   Elevated LFTs 12/29/2013    Past Surgical History:  Procedure Laterality Date   PROSTATE BIOPSY  June 2015   PROSTATE SURGERY     Right hand surgery     Scrapnel removal from scalp     During [place] war       Family History  Problem Relation Age of Onset   Cancer Father     Social History   Tobacco Use   Smoking status: Never   Smokeless tobacco: Never  Vaping Use   Vaping Use: Never used  Substance Use Topics   Alcohol use: Yes    Comment: occasionaly   Drug use: No    Home Medications Prior to Admission medications   Medication Sig Start Date End Date Taking? Authorizing Provider  amLODipine (NORVASC) 10 MG tablet Take 10 mg by mouth daily.   Yes [provider]  atorvastatin (LIPITOR) 80 MG tablet Take 40 mg by mouth at bedtime.   Yes [provider]  colchicine 0.6 MG tablet Take 0.6 mg by mouth daily as needed (gout). 05/08/20  Yes [provider]  diclofenac Sodium (VOLTAREN) 1 % GEL  Apply 2 g topically 2 (two) times daily. 10/01/20  Yes [provider]  furosemide (LASIX) 40 MG tablet Take 40 mg by mouth daily.   Yes [provider]  gabapentin (NEURONTIN) 100 MG capsule Take 100 mg by mouth 2 (two) times daily.   Yes [provider]  HYDROcodone-acetaminophen (NORCO) 10-325 MG per tablet Take 1 tablet by mouth every 12 (twelve) hours as needed for moderate pain.    Yes [provider]  loratadine (CLARITIN) 10 MG tablet Take 10 mg by mouth daily as needed for allergies. 05/08/20  Yes [provider]  magnesium oxide (MAG-OX) 400 (241.3 Mg) MG tablet Take 400 mg by mouth daily.   Yes [provider]  Multiple Vitamin (MULTIVITAMIN WITH MINERALS) TABS tablet Take 1 tablet by mouth daily.   Yes [provider]  naloxone (NARCAN) nasal spray 4 mg/0.1 mL Place 1 spray into the nose once as needed (overdose). 08/16/20  Yes [provider]  omeprazole (PRILOSEC) 20 MG capsule Take 2 capsules (40 mg total) by mouth 2 (two) times daily before a meal. Patient taking differently: Take 20 mg by mouth daily as needed. 12/24/18  Yes [name], MD  sertraline (ZOLOFT) 100 MG tablet Take 100 mg by mouth daily.   Yes [provider]  Tetrahydroz-Dextran-PEG-Povid 0.05-0.1-1-1 % SOLN Place 1 drop into both eyes daily.   Yes [provider]  traZODone (DESYREL) 100 MG tablet Take 100 mg by mouth at bedtime.   Yes [provider]  ibuprofen (ADVIL) 200 MG tablet Take 2 tablets (400 mg total) by mouth every 6 (six) hours as needed for fever or moderate pain. Patient not taking: No sig reported 12/24/18   [name], MD    Allergies    Viagra [sildenafil], Allopurinol, Ciprofloxacin, Spironolactone, and Vitamin d analogs  Review of Systems   Review of Systems  Constitutional:  Negative for chills and fever.  HENT:  Negative for ear pain and sore throat.   Eyes:  Negative for pain and visual disturbance.  Respiratory:  Negative for cough and shortness of breath.   Cardiovascular:  Negative for chest pain and palpitations.  Gastrointestinal:  Negative for abdominal pain and vomiting.  Genitourinary:  Negative for decreased urine volume, difficulty urinating, dysuria and hematuria.  Musculoskeletal:  Positive for back pain. Negative for arthralgias and neck pain.  Skin:  Negative for color change and rash.  Neurological:  Negative for seizures, syncope, weakness, numbness and headaches.  All other systems reviewed and are negative.  Physical Exam Updated Vital Signs BP (!) 124/93 (BP Location: Right Arm)   Pulse 81   Temp  98.2 F (36.8 C)   Resp 18   Ht '6\' 6"'$  (1.981 m)   Wt 98 kg   SpO2 97%   BMI 24.97 kg/m   Physical Exam Vitals and nursing note reviewed.  Constitutional:      Appearance: He is well-developed.     Comments: GCS 15, ABC intact  HENT:     Head: Normocephalic and atraumatic.  Eyes:     Conjunctiva/sclera: Conjunctivae normal.  Neck:     Comments: No midline tenderness to palpation of the cervical spine. Cardiovascular:     Rate and Rhythm: Normal rate and regular rhythm.     Heart sounds: No murmur heard. Pulmonary:     Effort: Pulmonary effort is normal. No respiratory distress.     Breath sounds: Normal breath sounds.  Chest:  Comments: Clavicles nontender to palpation.  Chest wall nontender to AP and lateral compression. Abdominal:     Palpations: Abdomen is soft.     Tenderness: There is no abdominal tenderness.  Musculoskeletal:        General: Tenderness present. No deformity or signs of injury.     Cervical back: Neck supple.     Comments: No midline tenderness to palpation of the thoracic spine.  Midline tenderness to palpation of the lumbar spine about L3.  Negative straight leg raise test  Skin:    General: Skin is warm and dry.  Neurological:     General: No focal deficit present.     Mental Status: He is alert. Mental status is at baseline.     Cranial Nerves: No cranial nerve deficit.     Sensory: No sensory deficit.     Motor: No weakness.     Coordination: Coordination normal.     Comments: Gait not assessed as the patient is a fall risk.    ED Results / Procedures / Treatments   Labs (all labs ordered are listed, but only abnormal results are displayed) Labs Reviewed - No data to display  EKG None  Radiology CT Head Wo Contrast  Result Date: 10/03/2020 CLINICAL DATA:  Head trauma, minor (Age >= 65y); Neck trauma (Age >= 65y) EXAM: CT HEAD WITHOUT CONTRAST CT CERVICAL SPINE WITHOUT CONTRAST TECHNIQUE: Multidetector CT imaging of the head and  cervical spine was performed following the standard protocol without intravenous contrast. Multiplanar CT image reconstructions of the cervical spine were also generated. COMPARISON:  03/02/2009 FINDINGS: CT HEAD FINDINGS Brain: No evidence of acute infarction, hemorrhage, hydrocephalus, extra-axial collection or mass lesion/mass effect. Scattered low-density changes within the periventricular and subcortical white matter compatible with chronic microvascular ischemic change. Mild diffuse cerebral volume loss. Vascular: Atherosclerotic calcifications involving the large vessels of the skull base. No unexpected hyperdense vessel. Skull: Normal. Negative for fracture or focal lesion. Sinuses/Orbits: Minimal mucosal thickening in the right maxillary sinus. Other: Negative for scalp hematoma. CT CERVICAL SPINE FINDINGS Alignment: Facet joints are aligned without dislocation or traumatic listhesis. Dens and lateral masses are aligned. Skull base and vertebrae: No acute fracture. No primary bone lesion or focal pathologic process. Soft tissues and spinal canal: No prevertebral fluid or swelling. No visible canal hematoma. Disc levels: Progressive degenerative disc disease at the C5-6 and C6-7 levels with canal stenosis suspected at both levels. Moderate multilevel bilateral facet arthropathy, right worse than left, also progressed from prior. Slight asymmetric widening of the right C4-5 facet joint with subchondral irregularity may be related to joint effusion and possible inflammatory arthropathy (series 6, image 25). Upper chest: Included lung apices are clear. Other: Bilateral carotid atherosclerosis. IMPRESSION: 1. No acute intracranial findings. 2. No acute fracture or traumatic listhesis of the cervical spine. 3. Slight asymmetric widening of the right C4-5 facet joint with subchondral irregularity may be related to joint effusion and associated arthritis or possible inflammatory arthropathy. Although felt to be  less likely, septic arthritis of the facet joint could have this appearance in the appropriate clinical setting. 4. 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 Electronically Signed   By: Davina Poke D.O.   On: 10/03/2020 14:44   CT Cervical Spine Wo Contrast  Result Date: 10/03/2020 CLINICAL DATA:  Head trauma, minor (Age >= 65y); Neck trauma (Age >= 65y) EXAM: CT HEAD WITHOUT CONTRAST CT CERVICAL SPINE WITHOUT CONTRAST TECHNIQUE: Multidetector CT imaging of the  head and cervical spine was performed following the standard protocol without intravenous contrast. Multiplanar CT image reconstructions of the cervical spine were also generated. COMPARISON:  03/02/2009 FINDINGS: CT HEAD FINDINGS Brain: No evidence of acute infarction, hemorrhage, hydrocephalus, extra-axial collection or mass lesion/mass effect. Scattered low-density changes within the periventricular and subcortical white matter compatible with chronic microvascular ischemic change. Mild diffuse cerebral volume loss. Vascular: Atherosclerotic calcifications involving the large vessels of the skull base. No unexpected hyperdense vessel. Skull: Normal. Negative for fracture or focal lesion. Sinuses/Orbits: Minimal mucosal thickening in the right maxillary sinus. Other: Negative for scalp hematoma. CT CERVICAL SPINE FINDINGS Alignment: Facet joints are aligned without dislocation or traumatic listhesis. Dens and lateral masses are aligned. Skull base and vertebrae: No acute fracture. No primary bone lesion or focal pathologic process. Soft tissues and spinal canal: No prevertebral fluid or swelling. No visible canal hematoma. Disc levels: Progressive degenerative disc disease at the C5-6 and C6-7 levels with canal stenosis suspected at both levels. Moderate multilevel bilateral facet arthropathy, right worse than left, also progressed from prior. Slight asymmetric widening of the right C4-5 facet joint with  subchondral irregularity may be related to joint effusion and possible inflammatory arthropathy (series 6, image 25). Upper chest: Included lung apices are clear. Other: Bilateral carotid atherosclerosis. IMPRESSION: 1. No acute intracranial findings. 2. No acute fracture or traumatic listhesis of the cervical spine. 3. Slight asymmetric widening of the right C4-5 facet joint with subchondral irregularity may be related to joint effusion and associated arthritis or possible inflammatory arthropathy. Although felt to be less likely, septic arthritis of the facet joint could have this appearance in the appropriate clinical setting. 4. 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 Electronically Signed   By: Davina Poke D.O.   On: 10/03/2020 14:44   CT Lumbar Spine Wo Contrast  Result Date: 10/03/2020 CLINICAL DATA:  Compression fracture, lumbar spine. Fall from standing position 5 days ago. Low back pain. EXAM: CT LUMBAR SPINE WITHOUT CONTRAST TECHNIQUE: Multidetector CT imaging of the lumbar spine was performed without intravenous contrast administration. Multiplanar CT image reconstructions were also generated. COMPARISON:  Abdominopelvic CT 06/01/2016. Lumbar spine radiographs 03/02/2009. FINDINGS: Segmentation: There are 5 lumbar type vertebral bodies. Alignment: Straightening without focal angulation or listhesis. Vertebrae: The bones are demineralized. Compared with the 2018 CT, there are new mild superior endplate compression fractures involving the superior endplates of L1, L2 and L3. Each of these results in less than 15% loss of vertebral body height, and there is no osseous retropulsion or involvement of posterior elements. Stable chronic endplate degenerative changes at L4-5. Mild sacroiliac degenerative changes bilaterally. Paraspinal and other soft tissues: No significant paraspinal hematoma. There is minimal soft tissue stranding in the retroperitoneum from  L1 through L3 which supports the fractures being acute. Aortic and branch vessel atherosclerosis. Disc levels: Relatively mild spondylosis from L1-2 through L3-4 with mild disc bulging and facet hypertrophy. Disc height is maintained, and there is no significant spinal stenosis or nerve root encroachment. At L4-5, there is chronic degenerative disc disease with loss of disc height, endplate osteophytes and facet hypertrophy contributing to mild spinal stenosis mild foraminal narrowing bilaterally. At L5-S1, the disc height and hydration are maintained. There is mild bilateral facet hypertrophy. IMPRESSION: 1. Mild superior endplate compression deformities at L1, L2 and L3, new from CT of 2018. Although technically indeterminate in age, there is minimal surrounding soft tissue stranding which supports possible acuity. 2. No osseous retropulsion or  significant spinal stenosis. 3. 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 4.  Aortic Atherosclerosis (ICD10-I70.0). Electronically Signed   By: Richardean Sale M.D.   On: 10/03/2020 12:01   MR LUMBAR SPINE WO CONTRAST  Result Date: 10/03/2020 CLINICAL DATA:  Low back pain, trauma EXAM: MRI LUMBAR SPINE WITHOUT CONTRAST TECHNIQUE: Multiplanar, multisequence MR imaging of the lumbar spine was performed. No intravenous contrast was administered. COMPARISON:  CT 10/03/2020 FINDINGS: Segmentation:  Standard. Alignment:  Physiologic. Vertebrae: Acute mild superior endplate compression fractures of the L1, L2, and L3 vertebral bodies each with approximately 10-15% vertebral body height loss. No bony retropulsion. No fracture extension into the posterior elements. Remaining vertebral body heights are maintained. No additional fractures. No evidence of discitis. No suspicious marrow replacing bone lesion. Chronic discogenic endplate marrow changes at the L4-5 level. Conus medullaris and cauda equina: Conus extends to the L1 level. Conus and cauda equina appear normal.  Paraspinal and other soft tissues: None. Disc levels: T12-L1: Mild facet arthropathy. No disc protrusion. No foraminal or canal stenosis. L1-L2: Mild facet arthropathy. Shallow left foraminal disc protrusion. No foraminal or canal stenosis. L2-L3: Mild facet arthropathy. Mild circumferential disc bulge. No foraminal or canal stenosis. L3-L4: Mild facet arthropathy. Mild circumferential disc bulge. No foraminal or canal stenosis. L4-L5: Disc height loss with circumferential disc osteophyte complex. Moderate facet arthropathy. Ligamentum flavum buckling. Moderate right and mild left foraminal stenosis without significant canal stenosis. L5-S1: Minimal annular disc bulge. Mild bilateral facet arthropathy. No foraminal or canal stenosis. IMPRESSION: 1. Acute mild superior endplate compression fractures of the L1, L2, and L3 vertebral bodies each with approximately 10-15% vertebral body height loss. 2. Mild multilevel degenerative changes of the lumbar spine greatest at the L4-5 level where there is moderate right and mild left foraminal stenosis. No significant canal stenosis at any level. Electronically Signed   By: Davina Poke D.O.   On: 10/03/2020 15:07   DG Pelvis Portable  Result Date: 10/03/2020 CLINICAL DATA:  Low back pain after fall. EXAM: PORTABLE PELVIS 1-2 VIEWS COMPARISON:  CT abdomen pelvis dated June 01, 2016. FINDINGS: There is no evidence of pelvic fracture or diastasis. No pelvic bone lesions are seen. Mild left hip osteoarthritis. IMPRESSION: 1. No acute osseous abnormality. Electronically Signed   By: Titus Dubin M.D.   On: 10/03/2020 12:15    Procedures Procedures   Medications Ordered in ED Medications  HYDROcodone-acetaminophen (NORCO/VICODIN) 5-325 MG per tablet 1 tablet (1 tablet Oral Given 10/03/20 1113)    ED Course  I have reviewed the triage vital signs and the nursing notes.  Pertinent labs & imaging results that were available during my care of the patient were  reviewed by me and considered in my medical decision making (see chart for details).    MDM Rules/Calculators/A&P                           Patient is a 75 year old male presenting with midline lumbar spinal pain and difficulty ambulating after a ground-level mechanical fall this past Sunday.  The patient states that one of his legs give out on him causing him to fall onto his left side with a twisting mechanism.  He also struck his head but did not lose consciousness.  He denies any neck pain or headache since the fall.  He denies any neurologic deficits.  He denies any other red flag symptoms other than the recent trauma.  He states that he normally ambulates  with the assistance of a walker and a cane but has been completely unable to ambulate since the fall due to significant pain in his back.  The pain is nonradiating.  No radiculopathy.  No loss of bowel or bladder function.  CT imaging of the lumbar spine was concerning for possible age-indeterminate L1, L2, L3 compression fractures.  Some concern for acuity given some reactive edema. Neurosurgery, Dr. Christella Noa was paged to discuss the patient's care.  Recommendations include MRI of the lumbar spine to evaluate for acuity of fractures.  Imaging of the head and cervical spine were negative for acute intracranial abnormality, possible widening of the disc space at C4-C5 facet joint with subchondral irregularity may related to joint effusion and associated arthritis or inflammatory arthropathy.  Patient has no fevers or chills, low concern for septic arthritis of the patient's facet joint in the cervical spine at this time.  There is no tenderness to palpation on physical exam with intact range of motion of the cervical spine.   MRI imaging was performed and concerning for acute mild SEP fractures of the L1, L2 and L3 vertebral bodies with approximately 10 to 15% vertebral body height loss.  Dr. Christella Noa  was contacted and recommended treatment with an  LSO brace, follow-up in clinic with neurosurgery in 2 weeks.  The patient plans to contact the [facility] for follow-up.  He currently has an outpatient prescription for opiates that he obtained through the [place].    Final Clinical Impression(s) / ED Diagnoses Final diagnoses:  Compression fracture of L1 vertebra, initial encounter ([place])  Compression fracture of L2 vertebra, initial encounter ([place])  Compression fracture of L3 vertebra, initial encounter [facility])    Rx / DC Orders ED Discharge Orders          Ordered    Ambulatory referral to Neurosurgery        10/03/20 1620             Regan Lemming, MD 10/03/20 1622

## 2020-10-03 NOTE — ED Notes (Signed)
Ortho tech called for TLSO brace

## 2021-05-17 ENCOUNTER — Encounter (HOSPITAL_COMMUNITY): Payer: Self-pay

## 2021-05-17 ENCOUNTER — Emergency Department (HOSPITAL_COMMUNITY)
Admission: EM | Admit: 2021-05-17 | Discharge: 2021-05-17 | Disposition: A | Discharge status: Home / Self Care | Payer: No Typology Code available for payment source | Attending: Emergency Medicine | Admitting: Emergency Medicine

## 2021-05-17 ENCOUNTER — Other Ambulatory Visit: Payer: Self-pay

## 2021-05-17 DIAGNOSIS — Z79899 Other long term (current) drug therapy: Secondary | ICD-10-CM | POA: Insufficient documentation

## 2021-05-17 DIAGNOSIS — E876 Hypokalemia: Secondary | ICD-10-CM | POA: Insufficient documentation

## 2021-05-17 DIAGNOSIS — K068 Other specified disorders of gingiva and edentulous alveolar ridge: Secondary | ICD-10-CM | POA: Diagnosis not present

## 2021-05-17 LAB — CBC WITH DIFFERENTIAL/PLATELET
Abs Immature Granulocytes: 0.03 10*3/uL (ref 0.00–0.07)
Basophils Absolute: 0.1 10*3/uL (ref 0.0–0.1)
Basophils Relative: 1 %
Eosinophils Absolute: 0.2 10*3/uL (ref 0.0–0.5)
Eosinophils Relative: 3 %
HCT: 34 % — ABNORMAL LOW (ref 39.0–52.0)
Hemoglobin: 12.2 g/dL — ABNORMAL LOW (ref 13.0–17.0)
Immature Granulocytes: 1 %
Lymphocytes Relative: 27 %
Lymphs Abs: 1.4 10*3/uL (ref 0.7–4.0)
MCH: 39.4 pg — ABNORMAL HIGH (ref 26.0–34.0)
MCHC: 35.9 g/dL (ref 30.0–36.0)
MCV: 109.7 fL — ABNORMAL HIGH (ref 80.0–100.0)
Monocytes Absolute: 0.8 10*3/uL (ref 0.1–1.0)
Monocytes Relative: 15 %
Neutro Abs: 2.8 10*3/uL (ref 1.7–7.7)
Neutrophils Relative %: 53 %
Platelets: 151 10*3/uL (ref 150–400)
RBC: 3.1 MIL/uL — ABNORMAL LOW (ref 4.22–5.81)
RDW: 12.8 % (ref 11.5–15.5)
WBC: 5.1 10*3/uL (ref 4.0–10.5)
nRBC: 0 % (ref 0.0–0.2)

## 2021-05-17 LAB — BASIC METABOLIC PANEL
Anion gap: 11 (ref 5–15)
BUN: 16 mg/dL (ref 8–23)
CO2: 23 mmol/L (ref 22–32)
Calcium: 9 mg/dL (ref 8.9–10.3)
Chloride: 100 mmol/L (ref 98–111)
Creatinine, Ser: 1.1 mg/dL (ref 0.61–1.24)
GFR, Estimated: >60 mL/min (ref >60)
Glucose, Bld: 108 mg/dL — ABNORMAL HIGH (ref 70–99)
Potassium: 3 mmol/L — ABNORMAL LOW (ref 3.5–5.1)
Sodium: 134 mmol/L — ABNORMAL LOW (ref 135–145)

## 2021-05-17 LAB — PROTIME-INR
INR: 1 (ref 0.8–1.2)
Prothrombin Time: 13.1 seconds (ref 11.4–15.2)

## 2021-05-17 MED ORDER — POTASSIUM CHLORIDE CRYS ER 20 MEQ PO TBCR
40.0000 meq | EXTENDED_RELEASE_TABLET | Freq: Once | ORAL | Status: AC
  Administered 2021-05-17: 40 meq via ORAL
  Filled 2021-05-17: qty 2

## 2021-05-17 NOTE — ED Triage Notes (Addendum)
Per EMS, Pt, from home, presents after waking up w/ gum bleeding x 3-4 days.  Denies pain.  Pt reports he is seen by a dentist at the [place].  Pt reports bleeding from top R teeth.    ? ?248m NS given en route ?

## 2021-05-17 NOTE — ED Notes (Addendum)
Upon assessment, EDP noted receeding gums and irritation on top R gums. ?

## 2021-05-17 NOTE — Discharge Instructions (Signed)
Return for any problem. ? ?Use softer toothbrush as discussed ? ?Please try to floss your teeth every day. ? ?Follow-up closely with your dentist as discussed. ?

## 2021-05-17 NOTE — ED Provider Notes (Signed)
[facility] EMERGENCY DEPARTMENT Provider Note   CSN: [id number] Arrival date & time: 05/17/21  6734     History  Chief Complaint  Patient presents with   Dental Bleeding    Steven Bradley is a 76 y.o. male.  76 year old male with prior medical history as detailed below presents for evaluation.  Patient reports longstanding issues with gingival bleeding.  This appears to have been a longstanding issue.  The patient reports regular bleeding after using a very stiff toothbrush.  Patient denies any blood in his stool.  Patient denies any hematemesis.  Patient denies any recent nosebleed.  Patient reports that he noticed slightly more blood than normal underneath his lip along his upper gum this morning when he woke.  Secondary to this he decided to come in for an evaluation.    The history is provided by the patient and medical records.  Illness Location:  Bleeding from gums Severity:  Mild Onset quality:  Gradual Duration:  3 weeks Timing:  Sporadic Progression:  Waxing and waning Chronicity:  Recurrent     Home Medications Prior to Admission medications   Medication Sig Start Date End Date Taking? Authorizing Provider  atorvastatin (LIPITOR) 80 MG tablet Take 40 mg by mouth at bedtime.   Yes [provider]  cholecalciferol (VITAMIN D) 25 MCG (1000 UNIT) tablet Take 1,000 Units by mouth daily.   Yes [provider]  colchicine 0.6 MG tablet Take 0.6 mg by mouth daily as needed (gout). 05/08/20  Yes [provider]  furosemide (LASIX) 40 MG tablet Take 40 mg by mouth daily.   Yes [provider]  gabapentin (NEURONTIN) 100 MG capsule Take 100 mg by mouth 3 (three) times daily.   Yes [provider]  loratadine (CLARITIN) 10 MG tablet Take 10 mg by mouth daily as needed for allergies. 05/08/20  Yes [provider]  magnesium oxide (MAG-OX) 400 (241.3 Mg) MG tablet Take 400 mg by mouth daily.   Yes  [provider]  Multiple Vitamin (MULTIVITAMIN WITH MINERALS) TABS tablet Take 1 tablet by mouth daily.   Yes [provider]  naproxen (NAPROSYN) 500 MG tablet Take 500 mg by mouth 2 (two) times daily with a meal.   Yes [provider]  sertraline (ZOLOFT) 100 MG tablet Take 100 mg by mouth daily.   Yes [provider]  Tetrahydroz-Dextran-PEG-Povid 0.05-0.1-1-1 % SOLN Place 1 drop into both eyes daily.   Yes [provider]  traZODone (DESYREL) 100 MG tablet Take 100 mg by mouth at bedtime.   Yes [provider]  ibuprofen (ADVIL) 200 MG tablet Take 2 tablets (400 mg total) by mouth every 6 (six) hours as needed for fever or moderate pain. Patient not taking: Reported on 10/03/2020 12/24/18   [name], MD  omeprazole (PRILOSEC) 20 MG capsule Take 2 capsules (40 mg total) by mouth 2 (two) times daily before a meal. Patient not taking: Reported on 05/17/2021 12/24/18   [name], MD      Allergies    Viagra [sildenafil], Allopurinol, Ciprofloxacin, Spironolactone, and Vitamin d analogs    Review of Systems   Review of Systems  All other systems reviewed and are negative.  Physical Exam Updated Vital Signs BP 121/85    Pulse 79    Temp 98 F (36.7 C) (Oral) Comment: Simultaneous filing. User may not have seen previous data. Comment (Src): Simultaneous filing. User may not have seen previous data.  Resp 16    Ht '6\' 6"'$  (1.981 m)    Wt 98 kg    SpO2 100%    BMI 24.96 kg/m  Physical Exam Vitals and nursing note reviewed.  Constitutional:      General: He is not in acute distress.    Appearance: Normal appearance. He is well-developed.  HENT:     Head: Normocephalic and atraumatic.     Mouth/Throat:     Comments: Mild gingival irritation along the upper gumline.  Several sites with apparent recent bleeding.  No evidence of dental abscess.  No evidence of significant dental decay.   Eyes:     Conjunctiva/sclera:  Conjunctivae normal.     Pupils: Pupils are equal, round, and reactive to light.  Cardiovascular:     Rate and Rhythm: Normal rate and regular rhythm.     Heart sounds: Normal heart sounds.  Pulmonary:     Effort: Pulmonary effort is normal. No respiratory distress.     Breath sounds: Normal breath sounds.  Abdominal:     General: There is no distension.     Palpations: Abdomen is soft.     Tenderness: There is no abdominal tenderness.  Musculoskeletal:        General: No deformity. Normal range of motion.     Cervical back: Normal range of motion and neck supple.  Skin:    General: Skin is warm and dry.  Neurological:     General: No focal deficit present.     Mental Status: He is alert and oriented to person, place, and time.    ED Results / Procedures / Treatments   Labs (all labs ordered are listed, but only abnormal results are displayed) Labs Reviewed  BASIC METABOLIC PANEL - Abnormal; Notable for the following components:      Result Value   Sodium 134 (*)    Potassium 3.0 (*)    Glucose, Bld 108 (*)    All other components within normal limits  CBC WITH DIFFERENTIAL/PLATELET - Abnormal; Notable for the following components:   RBC 3.10 (*)    Hemoglobin 12.2 (*)    HCT 34.0 (*)    MCV 109.7 (*)    MCH 39.4 (*)    All other components within normal limits  PROTIME-INR    EKG EKG Interpretation  Date/Time:  Sunday May 17 2021 09:40:28 EST Ventricular Rate:  99 PR Interval:  167 QRS Duration: 121 QT Interval:  363 QTC Calculation: 466 R Axis:   60 Text Interpretation: Sinus rhythm Nonspecific intraventricular conduction delay Confirmed by Dene Gentry [phone]) on 05/17/2021 9:52:17 AM  Radiology No results found.  Procedures Procedures    Medications Ordered in ED Medications  potassium chloride SA (KLOR-CON M) CR tablet 40 mEq (40 mEq Oral Given 05/17/21 1112)    ED Course/ Medical Decision Making/ A&P                           Medical Decision  Making Amount and/or Complexity of Data Reviewed Labs: ordered.  Risk Prescription drug management.   Medical Screen Complete  This patient presented to the ED with complaint of gingival bleeding.  This complaint involves an extensive number of treatment options. The initial differential diagnosis includes, but is not limited to, gingival bleeding, metabolic abnormality, thrombocytopenia, coagulopathy, etc  This presentation is: Acute, Chronic, Self-Limited, Previously Undiagnosed, Uncertain Prognosis, Complicated, Systemic Symptoms, and Threat to Life/Bodily Function  Is presenting with complaint of  gingival bleeding.  Patient reports that this has been an ongoing issue especially after he brushes with a very stiff toothbrush.  Patient without current use of antiplatelet agents or blood thinners.  Patient denies any GI bleeding.  Screening labs obtained are without significant abnormality.  Patient's blood counts are without significant abnormality.  INR is 1.  Potassium is mildly decreased at 3.0.  Potassium supplementation given.  Patient does understand need for close outpatient follow-up.  Patient does understand need for follow-up with his dentist.       Additional history obtained:  External records from outside sources obtained and reviewed including prior ED visits and prior Inpatient records.    Lab Tests:  I ordered and personally interpreted labs.  The pertinent results include: CBC, BMP, INR  Cardiac Monitoring:  The patient was maintained on a cardiac monitor.  I personally viewed and interpreted the cardiac monitor which showed an underlying rhythm of: NSR   Medicines ordered:  I ordered medication including potassium for hypokalemia Reevaluation of the patient after these medicines showed that the patient: improved   Problem List / ED Course:  Gingival bleeding   Reevaluation:  After the interventions noted above, I reevaluated the patient  and found that they have: improved   Disposition:  After consideration of the diagnostic results and the patients response to treatment, I feel that the patent would benefit from close outpatient follow-up.          Final Clinical Impression(s) / ED Diagnoses Final diagnoses:  Gingival bleeding    Rx / DC Orders ED Discharge Orders     None         Valarie Merino, MD 05/17/21 1143

## 2022-06-07 ENCOUNTER — Encounter: Payer: Self-pay | Admitting: Neurology

## 2022-07-08 ENCOUNTER — Ambulatory Visit (INDEPENDENT_AMBULATORY_CARE_PROVIDER_SITE_OTHER): Payer: No Typology Code available for payment source | Admitting: Neurology

## 2022-07-08 ENCOUNTER — Encounter: Payer: Self-pay | Admitting: Neurology

## 2022-07-08 VITALS — BP 118/71 | HR 124 | Ht 78.0 in | Wt 184.0 lb

## 2022-07-08 DIAGNOSIS — R569 Unspecified convulsions: Secondary | ICD-10-CM | POA: Diagnosis not present

## 2022-07-08 NOTE — Patient Instructions (Signed)
Good to meet you. Thank you for your service.   Schedule MRI brain with and without contrast  2. Schedule 1-hour EEG  3. Continue Levetiracetam (Keppra)  twice a day  4. Continue home physical therapy  5. Continue minimizing alcohol intake, with goal for eventual cessation  6. Follow-up in 4 months, call for any changes   Seizure Precautions: 1. If medication has been prescribed for you to prevent seizures, take it exactly as directed.  Do not stop taking the medicine without talking to your doctor first, even if you have not had a seizure in a long time.   2. Avoid activities in which a seizure would cause danger to yourself or to others.  Don't operate dangerous machinery, swim alone, or climb in high or dangerous places, such as on ladders, roofs, or girders.  Do not drive unless your doctor says you may.  3. If you have any warning that you may have a seizure, lay down in a safe place where you can't hurt yourself.    4.  No driving for 6 months from last seizure, as per [facility].   Please refer to the following link on the [employer]ile[employer]'s website for more information: http://www.epilepsyfoundation.org/answerplace/Social/driving/drivingu.cfm   5.  Maintain good sleep hygiene.   6.  Contact your doctor if you have any problems that may be related to the medicine you are taking.  7.  Call 911 and bring the patient back to the ED if:        A.  The seizure lasts longer than 5 minutes.       B.  The patient doesn't awaken shortly after the seizure  C.  The patient has new problems such as difficulty seeing, speaking or moving  D.  The patient was injured during the seizure  E.  The patient has a temperature over 102 F (39C)  F.  The patient vomited and now is having trouble breathing

## 2022-07-08 NOTE — Progress Notes (Signed)
NEUROLOGY CONSULTATION NOTE  Steven Bradley MRN: [id number] DOB: [birth year]  Referring provider: Floreen Comber, PA-C Primary care provider: [facility]  Reason for consult:  seizure   Thank you for your kind referral of Steven Bradley for consultation of the above symptoms. Although his history is well known to you, please allow me to reiterate it for the purpose of our medical record. The patient was accompanied to the clinic by his wife Steven Bradley who also provides collateral information. Records and images were personally reviewed where available.   HISTORY OF PRESENT ILLNESS: This is a 77 year old right-handed man with a history of alcoholism, alcoholic liver cirrhosis, portal hypertensive gastropathy, ascites, prostate cancer, admitted at [facility] for 2 weeks for hematemesis and melena. He had a witnessed seizure on 04/30/22 described as a tonic-clonic seizure lasting less than 5 seconds. Glucose 139. CT head no acute changes. Per notes, he was drinking 7-8 alcoholic drinks daily (gin and water), seizure felt most likely due to alcohol withdrawal. He was started on Levetiracetam 1000mg  BID. 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 The first occurred a year ago in his sleep, he went weeks without drinking alcohol. In August 2023 he had a seizure after stopping alcohol for a few days due to illness. He was watching TV in bed when he fell and had a convulsion lasting 5-8 minutes, he was dazed for a little while and refused to go to the ER. After the last seizure, he went to inpatient rehab and has been home for a month now. Since he got home, he had been bedridden, he has home PT and just this week started walking with assistance needed with bathing. He and his wife deny any staring/unresponsive episodes, he denies any olfactory/gustatory hallucinations, deja vu, rising epigastric sensation, focal numbness/tingling/weakness, myoclonic jerks. He has  headaches every now and then. He has dizziness when standing. He has chronic neck and back pain. He has tingling and numbness in both feet. He has some constipation. No diplopia, dysarthria/dysphagia. Last fall was 10 months ago. He does not sleep well, he gets 6 hours and feels Sertraline helps some. He has very bad mood swings, which is not new (predated Keppra). He has bilateral hand tremors. Since home, his wife has been monitoring alcohol, he was previously drinking 5-6 alcohol drinks daily, but now he only drinks on occasion (not daily). He does not drive.   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 He had a normal birth and early development.  There is no history of febrile convulsions, CNS infections such as meningitis/encephalitis, or family history of seizures.   PAST MEDICAL HISTORY: Past Medical History:  Diagnosis Date   Arthritis    Enlarged prostate    GERD (gastroesophageal reflux disease)    Gout    Patient doesn't recall having gout but was prescribed allopurinol for ankle swelling.   Hyperlipidemia    Hypertension    Prostate cancer June 2015   PTSD (post-traumatic stress disorder)    Syncope    Negative outpatient workup.   Vitamin D deficiency     PAST SURGICAL HISTORY: Past Surgical History:  Procedure Laterality Date   PROSTATE BIOPSY  June 2015   PROSTATE SURGERY     Right hand surgery     Scrapnel removal from scalp     During [place] war  MEDICATIONS: Current Outpatient Medications on File Prior to Visit  Medication Sig Dispense Refill   allopurinol (ZYLOPRIM) 100 MG tablet Take 100 mg by mouth daily.     atorvastatin (LIPITOR) 80 MG tablet Take 40 mg by mouth at bedtime.     colchicine 0.6 MG tablet Take 0.6 mg by mouth daily as needed (gout).     furosemide (LASIX) 40 MG tablet Take 40 mg by mouth daily.     gabapentin (NEURONTIN) 100 MG capsule Take 100 mg by mouth 3  (three) times daily.     levETIRAcetam (KEPPRA) 1000 MG tablet Take 1,000 mg by mouth 2 (two) times daily.     loratadine (CLARITIN) 10 MG tablet Take 10 mg by mouth daily as needed for allergies.     magnesium oxide (MAG-OX) 400 (241.3 Mg) MG tablet Take 400 mg by mouth daily.     Multiple Vitamin (MULTIVITAMIN WITH MINERALS) TABS tablet Take 1 tablet by mouth daily.     naproxen (NAPROSYN) 500 MG tablet Take 500 mg by mouth 2 (two) times daily with a meal.     omeprazole (PRILOSEC) 20 MG capsule Take 2 capsules (40 mg total) by mouth 2 (two) times daily before a meal. 60 capsule 0   sertraline (ZOLOFT) 100 MG tablet Take 100 mg by mouth daily.     Tetrahydroz-Dextran-PEG-Povid 0.05-0.1-1-1 % SOLN Place 1 drop into both eyes daily.     traZODone (DESYREL) 100 MG tablet Take 100 mg by mouth at bedtime.     No current facility-administered medications on file prior to visit.    ALLERGIES: Allergies  Allergen Reactions   Viagra [Sildenafil] Nausea And Vomiting   Ciprofloxacin Rash   Spironolactone Rash   Vitamin D Analogs Rash    FAMILY HISTORY: Family History  Problem Relation Age of Onset   Cancer Father     SOCIAL HISTORY: Social History   Socioeconomic History   Marital status: Married    Spouse name: Not on file   Number of children: Not on file   Years of education: Not on file   Highest education level: Not on file  Occupational History   Not on file  Tobacco Use   Smoking status: Never   Smokeless tobacco: Never  Vaping Use   Vaping Use: Never used  Substance and Sexual Activity   Alcohol use: Yes    Comment: occasionaly   Drug use: No   Sexual activity: Yes    Birth control/protection: None  Other Topics Concern   Not on file  Social History Narrative   Are you right handed or left handed? Right    Are you currently employed ? Retired    What is your current occupation? [employer]   Do you live at home alone? No    Who lives with you? Wife    What type of  home do you live in: 1 story or 2 story?  1 story        Social Determinants of Corporate investment banker Strain: Not on file  Food Insecurity: Not on file  Transportation Needs: Not on file  Physical Activity: Not on file  Stress: Not on file  Social Connections: Not on file  Intimate Partner Violence: Not on file     PHYSICAL EXAM: Vitals:   07/08/22 1031  BP: 118/71  Pulse: (!) 124  SpO2: 96%   General: No acute distress Head:  Normocephalic/atraumatic Skin/Extremities: No rash, no edema Neurological Exam: Mental status: alert  and oriented to person, place, and time, no dysarthria or aphasia, Fund of knowledge is appropriate.  Recent and remote memory are intact, 3/3 delayed recall.  Attention and concentration are normal, 5/5 WORLD backward.  Cranial nerves: CN I: not tested CN II: pupils equal, round, visual fields intact CN III, IV, VI:  full range of motion, no nystagmus, no ptosis CN V: facial sensation intact CN VII: upper and lower face symmetric CN VIII: hearing intact to conversation Bulk & Tone: normal, no fasciculations. Motor: 5/5 throughout with no pronator drift. Sensation: intact to light touch, cold, pin, vibration sense.  No extinction to double simultaneous stimulation.  Romberg test negative Deep Tendon Reflexes: brisk +2 throughout Cerebellar: no incoordination on finger to nose testing Gait: narrow-based and steady, able to tandem walk adequately. Tremor: none   IMPRESSION: This is a 77 year old right-handed man with a history of alcoholism, alcoholic liver cirrhosis, portal hypertensive gastropathy, ascites, prostate cancer, presenting for evaluation of seizures. He has had 3 seizures that appear to have occurred in the setting of alcohol withdrawal (although some reportedly occurred weeks from last drink).  We discussed doing a brain MRI with and without contrast and 1-hour EEG to assess for focal abnormalities that increase risk for recurrent  seizures. Continue Levetiracetam 1000mg  BID. Continue home PT. Discussed risk of seizures with alcohol, continue goal of reducing and eventual alcohol cessation. He does not drive. Follow-up in 4 months, call for any changes.   Thank you for allowing me to participate in the care of this patient. Please do not hesitate to call for any questions or concerns.   Patrcia Dolly, M.D.  CC: Kela Millin, PA-C, [facility]

## 2022-07-13 ENCOUNTER — Other Ambulatory Visit: Payer: No Typology Code available for payment source

## 2022-07-29 ENCOUNTER — Encounter: Payer: Self-pay | Admitting: Neurology

## 2022-08-03 ENCOUNTER — Other Ambulatory Visit: Payer: No Typology Code available for payment source

## 2022-08-04 ENCOUNTER — Ambulatory Visit
Admission: RE | Admit: 2022-08-04 | Discharge: 2022-08-04 | Disposition: A | Discharge status: Home / Self Care | Payer: No Typology Code available for payment source | Source: Ambulatory Visit | Attending: Neurology | Admitting: Neurology

## 2022-08-04 ENCOUNTER — Other Ambulatory Visit: Payer: No Typology Code available for payment source

## 2022-08-04 DIAGNOSIS — R569 Unspecified convulsions: Secondary | ICD-10-CM

## 2022-08-10 ENCOUNTER — Ambulatory Visit (INDEPENDENT_AMBULATORY_CARE_PROVIDER_SITE_OTHER): Payer: No Typology Code available for payment source | Admitting: Neurology

## 2022-08-10 DIAGNOSIS — R569 Unspecified convulsions: Secondary | ICD-10-CM

## 2022-08-10 NOTE — Progress Notes (Signed)
EEG complete - results pending 

## 2022-08-16 ENCOUNTER — Encounter: Payer: Self-pay | Admitting: Neurology

## 2022-08-18 NOTE — Procedures (Signed)
ELECTROENCEPHALOGRAM REPORT  Date of Study: 08/10/2022  Patient's Name: Steven Bradley MRN: [id number] Date of Birth: Nov 05, 1945  Referring Provider: Dr. Patrcia Dolly  Clinical History: This is a 77 year old man with convulsions. EEG for classification.  Medications: Keppra, Gabapentin, Zoloft, Lipitor, Lasix  Technical Summary: A multichannel digital 1-hour EEG recording measured by the international 10-20 system with electrodes applied with paste and impedances below 5000 ohms performed in our laboratory with EKG monitoring in an awake and asleep patient.  Hyperventilation was not performed. Photic stimulation was performed.  The digital EEG was referentially recorded, reformatted, and digitally filtered in a variety of bipolar and referential montages for optimal display.    Description: The patient is awake and asleep during the recording.  During maximal wakefulness, there is a symmetric, medium voltage 8 Hz posterior dominant rhythm that attenuates with eye opening.  The record is symmetric.  During drowsiness and sleep, there is an increase in theta slowing of the background.  Vertex waves and symmetric sleep spindles were seen. Photic stimulation did not elicit any abnormalities.  There were no epileptiform discharges or electrographic seizures seen.    EKG lead was unremarkable.  Impression: This 1-hour awake and asleep EEG is normal.    Clinical Correlation: A normal EEG does not exclude a clinical diagnosis of epilepsy.  If further clinical questions remain, prolonged EEG may be helpful.  Clinical correlation is advised.   Patrcia Dolly, M.D.

## 2022-10-17 ENCOUNTER — Inpatient Hospital Stay (HOSPITAL_COMMUNITY)
Admission: EM | Admit: 2022-10-17 | Discharge: 2022-10-20 | DRG: 872 | Disposition: A | Discharge status: Home / Self Care | Payer: No Typology Code available for payment source | Attending: Internal Medicine | Admitting: Internal Medicine

## 2022-10-17 ENCOUNTER — Encounter (HOSPITAL_COMMUNITY): Payer: Self-pay

## 2022-10-17 ENCOUNTER — Other Ambulatory Visit: Payer: Self-pay

## 2022-10-17 ENCOUNTER — Inpatient Hospital Stay (HOSPITAL_COMMUNITY): Payer: No Typology Code available for payment source

## 2022-10-17 DIAGNOSIS — N400 Enlarged prostate without lower urinary tract symptoms: Secondary | ICD-10-CM | POA: Diagnosis present

## 2022-10-17 DIAGNOSIS — G629 Polyneuropathy, unspecified: Secondary | ICD-10-CM | POA: Diagnosis present

## 2022-10-17 DIAGNOSIS — M109 Gout, unspecified: Secondary | ICD-10-CM | POA: Diagnosis present

## 2022-10-17 DIAGNOSIS — F32A Depression, unspecified: Secondary | ICD-10-CM | POA: Diagnosis present

## 2022-10-17 DIAGNOSIS — Z881 Allergy status to other antibiotic agents status: Secondary | ICD-10-CM

## 2022-10-17 DIAGNOSIS — E861 Hypovolemia: Secondary | ICD-10-CM | POA: Diagnosis present

## 2022-10-17 DIAGNOSIS — Z1152 Encounter for screening for COVID-19: Secondary | ICD-10-CM

## 2022-10-17 DIAGNOSIS — F4310 Post-traumatic stress disorder, unspecified: Secondary | ICD-10-CM | POA: Diagnosis present

## 2022-10-17 DIAGNOSIS — A419 Sepsis, unspecified organism: Principal | ICD-10-CM | POA: Diagnosis present

## 2022-10-17 DIAGNOSIS — Z79899 Other long term (current) drug therapy: Secondary | ICD-10-CM | POA: Diagnosis not present

## 2022-10-17 DIAGNOSIS — Z8546 Personal history of malignant neoplasm of prostate: Secondary | ICD-10-CM

## 2022-10-17 DIAGNOSIS — E876 Hypokalemia: Secondary | ICD-10-CM | POA: Diagnosis not present

## 2022-10-17 DIAGNOSIS — I10 Essential (primary) hypertension: Secondary | ICD-10-CM | POA: Diagnosis present

## 2022-10-17 DIAGNOSIS — E872 Acidosis, unspecified: Secondary | ICD-10-CM | POA: Diagnosis present

## 2022-10-17 DIAGNOSIS — E871 Hypo-osmolality and hyponatremia: Secondary | ICD-10-CM | POA: Diagnosis present

## 2022-10-17 DIAGNOSIS — G40909 Epilepsy, unspecified, not intractable, without status epilepticus: Secondary | ICD-10-CM | POA: Diagnosis present

## 2022-10-17 DIAGNOSIS — E785 Hyperlipidemia, unspecified: Secondary | ICD-10-CM | POA: Diagnosis present

## 2022-10-17 DIAGNOSIS — R531 Weakness: Secondary | ICD-10-CM | POA: Diagnosis not present

## 2022-10-17 DIAGNOSIS — N390 Urinary tract infection, site not specified: Secondary | ICD-10-CM | POA: Diagnosis present

## 2022-10-17 DIAGNOSIS — R6520 Severe sepsis without septic shock: Secondary | ICD-10-CM | POA: Diagnosis present

## 2022-10-17 DIAGNOSIS — K219 Gastro-esophageal reflux disease without esophagitis: Secondary | ICD-10-CM | POA: Diagnosis present

## 2022-10-17 DIAGNOSIS — F1020 Alcohol dependence, uncomplicated: Secondary | ICD-10-CM | POA: Diagnosis present

## 2022-10-17 DIAGNOSIS — K7030 Alcoholic cirrhosis of liver without ascites: Secondary | ICD-10-CM | POA: Diagnosis present

## 2022-10-17 DIAGNOSIS — E559 Vitamin D deficiency, unspecified: Secondary | ICD-10-CM | POA: Diagnosis present

## 2022-10-17 DIAGNOSIS — R7989 Other specified abnormal findings of blood chemistry: Secondary | ICD-10-CM | POA: Diagnosis present

## 2022-10-17 DIAGNOSIS — Z888 Allergy status to other drugs, medicaments and biological substances status: Secondary | ICD-10-CM

## 2022-10-17 DIAGNOSIS — E860 Dehydration: Secondary | ICD-10-CM | POA: Diagnosis present

## 2022-10-17 LAB — COMPREHENSIVE METABOLIC PANEL
ALT: 85 U/L — ABNORMAL HIGH (ref 0–44)
AST: 147 U/L — ABNORMAL HIGH (ref 15–41)
Albumin: 3 g/dL — ABNORMAL LOW (ref 3.5–5.0)
Alkaline Phosphatase: 192 U/L — ABNORMAL HIGH (ref 38–126)
Anion gap: 14 (ref 5–15)
BUN: 15 mg/dL (ref 8–23)
CO2: 20 mmol/L — ABNORMAL LOW (ref 22–32)
Calcium: 8.8 mg/dL — ABNORMAL LOW (ref 8.9–10.3)
Chloride: 99 mmol/L (ref 98–111)
Creatinine, Ser: 0.72 mg/dL (ref 0.61–1.24)
GFR, Estimated: >60 mL/min (ref >60)
Glucose, Bld: 117 mg/dL — ABNORMAL HIGH (ref 70–99)
Potassium: 4 mmol/L (ref 3.5–5.1)
Sodium: 133 mmol/L — ABNORMAL LOW (ref 135–145)
Total Bilirubin: 4.4 mg/dL — ABNORMAL HIGH (ref 0.3–1.2)
Total Protein: 7.7 g/dL (ref 6.5–8.1)

## 2022-10-17 LAB — URINALYSIS, ROUTINE W REFLEX MICROSCOPIC
Glucose, UA: NEGATIVE mg/dL
Ketones, ur: 5 mg/dL — AB
Nitrite: POSITIVE — AB
Protein, ur: >=300 mg/dL — AB
RBC / HPF: >50 RBC/hpf (ref 0–5)
Specific Gravity, Urine: 1.024 (ref 1.005–1.030)
WBC, UA: >50 WBC/hpf (ref 0–5)
pH: 6 (ref 5.0–8.0)

## 2022-10-17 LAB — CBC WITH DIFFERENTIAL/PLATELET
Abs Immature Granulocytes: 0.04 10*3/uL (ref 0.00–0.07)
Basophils Absolute: 0.1 10*3/uL (ref 0.0–0.1)
Basophils Relative: 1 %
Eosinophils Absolute: 0.1 10*3/uL (ref 0.0–0.5)
Eosinophils Relative: 1 %
HCT: 38.6 % — ABNORMAL LOW (ref 39.0–52.0)
Hemoglobin: 13 g/dL (ref 13.0–17.0)
Immature Granulocytes: 0 %
Lymphocytes Relative: 12 %
Lymphs Abs: 1.2 10*3/uL (ref 0.7–4.0)
MCH: 34 pg (ref 26.0–34.0)
MCHC: 33.7 g/dL (ref 30.0–36.0)
MCV: 101 fL — ABNORMAL HIGH (ref 80.0–100.0)
Monocytes Absolute: 1.7 10*3/uL — ABNORMAL HIGH (ref 0.1–1.0)
Monocytes Relative: 16 %
Neutro Abs: 7.5 10*3/uL (ref 1.7–7.7)
Neutrophils Relative %: 70 %
Platelets: 119 10*3/uL — ABNORMAL LOW (ref 150–400)
RBC: 3.82 MIL/uL — ABNORMAL LOW (ref 4.22–5.81)
RDW: 14.6 % (ref 11.5–15.5)
WBC: 10.6 10*3/uL — ABNORMAL HIGH (ref 4.0–10.5)
nRBC: 0 % (ref 0.0–0.2)

## 2022-10-17 LAB — RESP PANEL BY RT-PCR (RSV, FLU A&B, COVID)  RVPGX2
Influenza A by PCR: NEGATIVE
Influenza B by PCR: NEGATIVE
Resp Syncytial Virus by PCR: NEGATIVE
SARS Coronavirus 2 by RT PCR: NEGATIVE

## 2022-10-17 LAB — HEPATITIS PANEL, ACUTE
HCV Ab: NONREACTIVE
Hep A IgM: NONREACTIVE
Hep B C IgM: NONREACTIVE
Hepatitis B Surface Ag: NONREACTIVE

## 2022-10-17 LAB — I-STAT CG4 LACTIC ACID, ED
Lactic Acid, Venous: 2 mmol/L (ref 0.5–1.9)
Lactic Acid, Venous: 0.6 mmol/L (ref 0.5–1.9)

## 2022-10-17 MED ORDER — AMLODIPINE BESYLATE 5 MG PO TABS
5.0000 mg | ORAL_TABLET | Freq: Every day | ORAL | Status: DC
  Administered 2022-10-18 – 2022-10-20 (×3): 5 mg via ORAL
  Filled 2022-10-17 (×3): qty 1

## 2022-10-17 MED ORDER — ACETAMINOPHEN 650 MG RE SUPP: 650.0000 mg | Freq: Four times a day (QID) | RECTAL | Status: DC | PRN

## 2022-10-17 MED ORDER — GABAPENTIN 100 MG PO CAPS
100.0000 mg | ORAL_CAPSULE | Freq: Three times a day (TID) | ORAL | Status: DC
  Administered 2022-10-17 – 2022-10-20 (×9): 100 mg via ORAL
  Filled 2022-10-17 (×9): qty 1

## 2022-10-17 MED ORDER — TRAZODONE HCL 100 MG PO TABS
100.0000 mg | ORAL_TABLET | Freq: Every day | ORAL | Status: DC
  Administered 2022-10-17 – 2022-10-19 (×3): 100 mg via ORAL
  Filled 2022-10-17 (×3): qty 1

## 2022-10-17 MED ORDER — IBUPROFEN 400 MG PO TABS
600.0000 mg | ORAL_TABLET | Freq: Four times a day (QID) | ORAL | Status: DC | PRN
  Administered 2022-10-17 – 2022-10-20 (×4): 600 mg via ORAL
  Filled 2022-10-17 (×4): qty 1

## 2022-10-17 MED ORDER — ATORVASTATIN CALCIUM 40 MG PO TABS
40.0000 mg | ORAL_TABLET | Freq: Every day | ORAL | Status: DC
  Administered 2022-10-17 – 2022-10-19 (×3): 40 mg via ORAL
  Filled 2022-10-17 (×3): qty 1

## 2022-10-17 MED ORDER — ONDANSETRON HCL 4 MG PO TABS: 4.0000 mg | ORAL_TABLET | Freq: Four times a day (QID) | ORAL | Status: DC | PRN

## 2022-10-17 MED ORDER — ACETAMINOPHEN 325 MG PO TABS
650.0000 mg | ORAL_TABLET | Freq: Once | ORAL | Status: AC
  Administered 2022-10-17: 650 mg via ORAL
  Filled 2022-10-17: qty 2

## 2022-10-17 MED ORDER — ALBUTEROL SULFATE (2.5 MG/3ML) 0.083% IN NEBU: 2.5000 mg | INHALATION_SOLUTION | RESPIRATORY_TRACT | Status: DC | PRN

## 2022-10-17 MED ORDER — TRAZODONE HCL 50 MG PO TABS: 25.0000 mg | ORAL_TABLET | Freq: Every evening | ORAL | Status: DC | PRN

## 2022-10-17 MED ORDER — LEVETIRACETAM 500 MG PO TABS
1000.0000 mg | ORAL_TABLET | Freq: Two times a day (BID) | ORAL | Status: DC
  Administered 2022-10-17 – 2022-10-20 (×6): 1000 mg via ORAL
  Filled 2022-10-17 (×6): qty 2

## 2022-10-17 MED ORDER — ACETAMINOPHEN 325 MG PO TABS: 650.0000 mg | ORAL_TABLET | Freq: Four times a day (QID) | ORAL | Status: DC | PRN

## 2022-10-17 MED ORDER — PANTOPRAZOLE SODIUM 40 MG PO TBEC
40.0000 mg | DELAYED_RELEASE_TABLET | Freq: Every day | ORAL | Status: DC
  Administered 2022-10-18 – 2022-10-20 (×3): 40 mg via ORAL
  Filled 2022-10-17 (×3): qty 1

## 2022-10-17 MED ORDER — ONDANSETRON HCL 4 MG/2ML IJ SOLN: 4.0000 mg | Freq: Four times a day (QID) | INTRAMUSCULAR | Status: DC | PRN

## 2022-10-17 MED ORDER — LACTATED RINGERS IV SOLN: INTRAVENOUS | Status: DC

## 2022-10-17 MED ORDER — SERTRALINE HCL 100 MG PO TABS
100.0000 mg | ORAL_TABLET | Freq: Every day | ORAL | Status: DC
  Administered 2022-10-18 – 2022-10-20 (×3): 100 mg via ORAL
  Filled 2022-10-17 (×3): qty 1

## 2022-10-17 MED ORDER — ENOXAPARIN SODIUM 40 MG/0.4ML IJ SOSY
40.0000 mg | PREFILLED_SYRINGE | INTRAMUSCULAR | Status: DC
  Administered 2022-10-17 – 2022-10-19 (×3): 40 mg via SUBCUTANEOUS
  Filled 2022-10-17 (×3): qty 0.4

## 2022-10-17 MED ORDER — SODIUM CHLORIDE 0.9 % IV SOLN
1.0000 g | INTRAVENOUS | Status: DC
  Administered 2022-10-17 – 2022-10-20 (×4): 1 g via INTRAVENOUS
  Filled 2022-10-17 (×4): qty 10

## 2022-10-17 NOTE — ED Provider Notes (Signed)
EMERGENCY DEPARTMENT AT [facility] Provider Note   CSN: [id number] Arrival date & time: 10/17/22  1142     History  Chief Complaint  Patient presents with   Fever   Weakness    Steven Bradley is a 77 y.o. male.  77 year old male presents with increased weakness and possible UTI.  Notes he has had polyuria.  He has also had a fever but denies any respiratory symptoms.  No cough or congestion.  No nausea or vomiting.  Denies any recent history of falls.  Symptoms have been progressive for the past 3 days.  Had a home test that was positive for UTI.       Home Medications Prior to Admission medications   Medication Sig Start Date End Date Taking? Authorizing Provider  allopurinol (ZYLOPRIM) 100 MG tablet Take 100 mg by mouth daily. 06/01/22   [provider]  atorvastatin (LIPITOR) 80 MG tablet Take 40 mg by mouth at bedtime.    [provider]  colchicine 0.6 MG tablet Take 0.6 mg by mouth daily as needed (gout). 05/08/20   [provider]  furosemide (LASIX) 40 MG tablet Take 40 mg by mouth daily.    [provider]  gabapentin (NEURONTIN) 100 MG capsule Take 100 mg by mouth 3 (three) times daily.    [provider]  levETIRAcetam (KEPPRA) 1000 MG tablet Take 1,000 mg by mouth 2 (two) times daily. 05/10/22   [provider]  loratadine (CLARITIN) 10 MG tablet Take 10 mg by mouth daily as needed for allergies. 05/08/20   [provider]  magnesium oxide (MAG-OX) 400 (241.3 Mg) MG tablet Take 400 mg by mouth daily.    [provider]  Multiple Vitamin (MULTIVITAMIN WITH MINERALS) TABS tablet Take 1 tablet by mouth daily.    [provider]  naproxen (NAPROSYN) 500 MG tablet Take 500 mg by mouth 2 (two) times daily with a meal.    [provider]  omeprazole (PRILOSEC) 20 MG capsule Take 2 capsules (40 mg total) by mouth 2 (two) times daily before a meal. 12/24/18   Marinda Elk, MD  sertraline (ZOLOFT) 100 MG tablet Take 100 mg by mouth daily.    [provider]  Tetrahydroz-Dextran-PEG-Povid 0.05-0.1-1-1 % SOLN Place 1 drop into both eyes daily.    [provider]  traZODone (DESYREL) 100 MG tablet Take 100 mg by mouth at bedtime.    [provider]      Allergies    Viagra [sildenafil], Ciprofloxacin, Spironolactone, and Vitamin d analogs    Review of Systems   Review of Systems  All other systems reviewed and are negative.   Physical Exam Updated Vital Signs BP (!) 149/95   Pulse (!) 108   Temp (!) 102 F (38.9 C) (Rectal)   Resp 17   Ht 1.981 m (6\' 6" )   Wt 87.1 kg   SpO2 96%   BMI 22.19 kg/m  Physical Exam Vitals and nursing note reviewed.  Constitutional:      General: He is not in acute distress.    Appearance: Normal appearance. He is well-developed. He is not toxic-appearing.  HENT:     Head: Normocephalic and atraumatic.  Eyes:     General: Lids are normal.     Conjunctiva/sclera: Conjunctivae normal.     Pupils: Pupils are equal, round, and reactive to light.  Neck:     Thyroid: No thyroid mass.  Trachea: No tracheal deviation.  Cardiovascular:     Rate and Rhythm: Regular rhythm. Tachycardia present.     Heart sounds: Normal heart sounds. No murmur heard.    No gallop.  Pulmonary:     Effort: Pulmonary effort is normal. No respiratory distress.     Breath sounds: Normal breath sounds. No stridor. No decreased breath sounds, wheezing, rhonchi or rales.  Abdominal:     General: There is no distension.     Palpations: Abdomen is soft.     Tenderness: There is no abdominal tenderness. There is no rebound.  Musculoskeletal:        General: No tenderness. Normal range of motion.     Cervical back: Normal range of motion and neck supple.  Skin:    General: Skin is warm and dry.     Findings: No abrasion or rash.  Neurological:     General: No focal deficit present.     Mental  Status: He is alert and oriented to person, place, and time. Mental status is at baseline.     GCS: GCS eye subscore is 4. GCS verbal subscore is 5. GCS motor subscore is 6.     Cranial Nerves: Cranial nerves are intact. No cranial nerve deficit.     Sensory: No sensory deficit.     Motor: Motor function is intact.  Psychiatric:        Attention and Perception: Attention normal.        Speech: Speech normal.        Behavior: Behavior normal.     ED Results / Procedures / Treatments   Labs (all labs ordered are listed, but only abnormal results are displayed) Labs Reviewed  CULTURE, BLOOD (ROUTINE X 2)  CULTURE, BLOOD (ROUTINE X 2)  RESP PANEL BY RT-PCR (RSV, FLU A&B, COVID)  RVPGX2  URINALYSIS, ROUTINE W REFLEX MICROSCOPIC  CBC WITH DIFFERENTIAL/PLATELET  COMPREHENSIVE METABOLIC PANEL  I-STAT CG4 LACTIC ACID, ED    EKG EKG Interpretation Date/Time:  Sunday October 17 2022 11:50:08 EDT Ventricular Rate:  109 PR Interval:    QRS Duration:  88 QT Interval:  334 QTC Calculation: 450 R Axis:   84  Text Interpretation: Sinus rhythm Interpretation limited secondary to artifact Borderline right axis deviation Reconfirmed by Lorre Nick ([id number]) on 10/17/2022 12:36:04 PM  Radiology No results found.  Procedures Procedures    Medications Ordered in ED Medications  lactated ringers infusion (has no administration in time range)  acetaminophen (TYLENOL) tablet 650 mg (has no administration in time range)    ED Course/ Medical Decision Making/ A&P                                 Medical Decision Making Amount and/or Complexity of Data Reviewed Labs: ordered.  Risk OTC drugs. Prescription drug management.   Code sepsis called.  Patient given IV fluids and IV Rocephin for presumptive urinary tract infection.  Urinalysis confirms infection.  Lactate also elevated.  Patient's mental status has improved.  He remains tachycardic.  Will require admission.  Will consult  hospitalist team.  Also given Tylenol   EKG per my interpretation shows sinus tachycardia  CRITICAL CARE Performed by: Toy Baker Total critical care time: 50 minutes Critical care time was exclusive of separately billable procedures and treating other patients. Critical care was necessary to treat or prevent imminent or life-threatening deterioration. Critical care was time spent personally by  me on the following activities: development of treatment plan with patient and/or surrogate as well as nursing, discussions with consultants, evaluation of patient's response to treatment, examination of patient, obtaining history from patient or surrogate, ordering and performing treatments and interventions, ordering and review of laboratory studies, ordering and review of radiographic studies, pulse oximetry and re-evaluation of patient's condition.        Final Clinical Impression(s) / ED Diagnoses Final diagnoses:  None    Rx / DC Orders ED Discharge Orders     None         Lorre Nick, MD 10/17/22 1439

## 2022-10-17 NOTE — Plan of Care (Signed)
  Problem: Education: Goal: Knowledge of General Education information will improve Description: Including pain rating scale, medication(s)/side effects and non-pharmacologic comfort measures Outcome: Progressing   Problem: Activity: Goal: Risk for activity intolerance will decrease Outcome: Progressing   Problem: Clinical Measurements: Goal: Will remain free from infection Outcome: Progressing   

## 2022-10-17 NOTE — H&P (Signed)
History and Physical  Steven Bradley ZOX:[id number] DOB: 1945/04/07 DOA: 10/17/2022  PCP: [facility]   Chief Complaint: Fever, lethargy  HPI: Steven Bradley is a 77 y.o. male with medical history significant for alcoholism with alcoholic liver cirrhosis, hypertension, hyperlipidemia, prostate cancer, epilepsy being admitted to the hospital with sepsis due to urinary tract infection.  History provided by the patient, states for the last couple days he has been feeling quite tired, has been urinating frequently.  Denies dysuria.  Starting yesterday, he started having high fevers as high as 103 F.  Came to the ER for evaluation for this reason.  Denies abdominal pain, nausea, vomiting, cough, shortness of breath.  ED Course: The emergency department, had fever of 102 F, tachycardic heart rate as high as 112, blood pressure stable 140/101, saturating well on room air.  Lab work as detailed below with leukocytosis, lactate initially 2.0, improved to 0.6 with IV fluids.  CMP reveals sodium 133, normal creatinine, AST 147, ALT 85.  Review of Systems: Please see HPI for pertinent positives and negatives. A complete 10 system review of systems are otherwise negative.  Past Medical History:  Diagnosis Date   Arthritis    Enlarged prostate    GERD (gastroesophageal reflux disease)    Gout    Patient doesn't recall having gout but was prescribed allopurinol for ankle swelling.   Hyperlipidemia    Hypertension    Prostate cancer [facility]) June 2015   PTSD (post-traumatic stress disorder)    Syncope    Negative outpatient workup.   Vitamin D deficiency    Past Surgical History:  Procedure Laterality Date   PROSTATE BIOPSY  June 2015   PROSTATE SURGERY     Right hand surgery     Scrapnel removal from scalp     During [place] war    Social History:  reports that he has never smoked. He has never used smokeless tobacco. He reports current alcohol use. He reports that he does not use  drugs.   Allergies  Allergen Reactions   Viagra [Sildenafil] Nausea And Vomiting   Ciprofloxacin Rash   Spironolactone Rash   Vitamin D Analogs Rash    Family History  Problem Relation Age of Onset   Cancer Father      Prior to Admission medications   Medication Sig Start Date End Date Taking? Authorizing Provider  allopurinol (ZYLOPRIM) 100 MG tablet Take 100 mg by mouth daily. 06/01/22  Yes [provider]  amLODipine (NORVASC) 5 MG tablet Take 5 mg by mouth daily. 06/01/22  Yes [provider]  atorvastatin (LIPITOR) 80 MG tablet Take 40 mg by mouth at bedtime.   Yes [provider]  capsaicin (ZOSTRIX) 0.025 % cream Apply 1 Application topically 2 (two) times daily as needed (skin). 09/27/22  Yes [provider]  colchicine 0.6 MG tablet Take 0.6 mg by mouth daily as needed (gout). 05/08/20  Yes [provider]  diclofenac Sodium (VOLTAREN) 1 % GEL Apply 2 g topically 3 (three) times daily as needed (Neck and hand pain). 09/27/22  Yes [provider]  furosemide (LASIX) 40 MG tablet Take 40 mg by mouth daily.   Yes [provider]  gabapentin (NEURONTIN) 100 MG capsule Take 100 mg by mouth 3 (three) times daily.   Yes [provider]  levETIRAcetam (KEPPRA) 1000 MG tablet Take 1,000 mg by mouth 2 (two) times daily. 05/10/22  Yes [provider]  loratadine (CLARITIN) 10 MG tablet  Take 10 mg by mouth daily as needed for allergies. 05/08/20  Yes [provider]  magnesium oxide (MAG-OX) 400 (241.3 Mg) MG tablet Take 400 mg by mouth daily.   Yes [provider]  Multiple Vitamin (MULTIVITAMIN WITH MINERALS) TABS tablet Take 1 tablet by mouth daily.   Yes [provider]  naproxen (NAPROSYN) 500 MG tablet Take 500 mg by mouth 2 (two) times daily with a meal.   Yes [provider]  omeprazole (PRILOSEC) 20 MG capsule Take 2 capsules (40 mg total) by mouth 2 (two) times daily  before a meal. 12/24/18  Yes Marinda Elk, MD  sertraline (ZOLOFT) 100 MG tablet Take 100 mg by mouth daily.   Yes [provider]  tadalafil (CIALIS) 5 MG tablet Take 5 mg by mouth daily as needed for erectile dysfunction. 06/01/22  Yes [provider]  Tetrahydroz-Dextran-PEG-Povid 0.05-0.1-1-1 % SOLN Place 1 drop into both eyes daily.   Yes [provider]  traZODone (DESYREL) 100 MG tablet Take 100 mg by mouth at bedtime.   Yes [provider]    Physical Exam: BP (!) 140/101   Pulse 100   Temp 98.9 F (37.2 C) (Oral)   Resp 19   Ht 6\' 6"  (1.981 m)   Wt 87.1 kg   SpO2 97%   BMI 22.19 kg/m   General:  Alert, oriented, calm, in no acute distress, nontoxic in appearance Eyes: EOMI, clear conjuctivae, white sclerea Neck: supple, no masses, trachea mildline  Cardiovascular: RRR, no murmurs or rubs, no peripheral edema  Respiratory: clear to auscultation bilaterally, no wheezes, no crackles  Abdomen: soft, nontender, nondistended, normal bowel tones heard  Skin: dry, no rashes  Musculoskeletal: no joint effusions, normal range of motion  Psychiatric: appropriate affect, normal speech  Neurologic: extraocular muscles intact, clear speech, moving all extremities with intact sensorium          Labs on Admission:  Basic Metabolic Panel: Recent Labs  Lab 10/17/22 1336  NA 133*  K 4.0  CL 99  CO2 20*  GLUCOSE 117*  BUN 15  CREATININE 0.72  CALCIUM 8.8*   Liver Function Tests: Recent Labs  Lab 10/17/22 1336  AST 147*  ALT 85*  ALKPHOS 192*  BILITOT 4.4*  PROT 7.7  ALBUMIN 3.0*   No results for input(s): "LIPASE", "AMYLASE" in the last 168 hours. No results for input(s): "AMMONIA" in the last 168 hours. CBC: Recent Labs  Lab 10/17/22 1225  WBC 10.6*  NEUTROABS 7.5  HGB 13.0  HCT 38.6*  MCV 101.0*  PLT 119*   Cardiac Enzymes: No results for input(s): "CKTOTAL", "CKMB", "CKMBINDEX", "TROPONINI" in the last 168  hours.  BNP (last 3 results) No results for input(s): "BNP" in the last 8760 hours.  ProBNP (last 3 results) No results for input(s): "PROBNP" in the last 8760 hours.  CBG: No results for input(s): "GLUCAP" in the last 168 hours.  Radiological Exams on Admission: No results found.  Assessment/Plan PERRY BRUCATO is a 77 y.o. male with medical history significant for alcoholism with alcoholic liver cirrhosis, hypertension, hyperlipidemia, prostate cancer, epilepsy being admitted to the hospital with sepsis due to urinary tract infection.   Sepsis due to UTI-meeting criteria with leukocytosis, fever, tachycardia.  Source is UTI, with elevated lactate of 2.0 on ER presentation, now improved to normal range.  Hemodynamically stable. -Inpatient admission -Follow blood and urine cultures -Empiric IV Rocephin 1 g daily  Epilepsy-continue Keppra  Hyperlipidemia-continue Lipitor  Hypertension-continue Norvasc  Abnormal LFTs-noted history of alcoholic liver disease and cirrhosis.  However, LFTs are higher than chronic baseline.  He denies any abdominal pain, or nausea. -Avoid hepatotoxins -Check acute hepatitis panel, and right upper quadrant ultrasound -Check LFTs with daily labs  GERD-p.o. Protonix  Depression/PTSD-continue Zoloft  Peripheral neuropathy-Neurontin 100 mg p.o. 3 times daily  DVT prophylaxis: Lovenox     Code Status: Full Code  Consults called: None  Admission status: The appropriate patient status for this patient is INPATIENT. Inpatient status is judged to be reasonable and necessary in order to provide the required intensity of service to ensure the patient's safety. The patient's presenting symptoms, physical exam findings, and initial radiographic and laboratory data in the context of their chronic comorbidities is felt to place them at high risk for further clinical deterioration. Furthermore, it is not anticipated that the patient will be medically stable for  discharge from the hospital within 2 midnights of admission.    I certify that at the point of admission it is my clinical judgment that the patient will require inpatient hospital care spanning beyond 2 midnights from the point of admission due to high intensity of service, high risk for further deterioration and high frequency of surveillance required  Time spent: 49 minutes   Sharlette Dense MD [facility] Pager [phone]  If 7PM-7AM, please contact night-coverage [email or web address] Password [email or web address]  10/17/2022, 3:22 PM

## 2022-10-17 NOTE — ED Triage Notes (Signed)
BIB EMS from home for weakness, fever at home for 3 days. Did at home UTI test yesterday that was positive. A&Ox4 on arrival.

## 2022-10-17 NOTE — Plan of Care (Signed)
  Problem: Elimination: Goal: Will not experience complications related to bowel motility 10/17/2022 2233 by Kizzie Bane, RN Outcome: Progressing 10/17/2022 2233 by Kizzie Bane, RN Outcome: Progressing Goal: Will not experience complications related to urinary retention 10/17/2022 2233 by Kizzie Bane, RN Outcome: Progressing 10/17/2022 2233 by Kizzie Bane, RN Outcome: Progressing   Problem: Coping: Goal: Level of anxiety will decrease 10/17/2022 2233 by Kizzie Bane, RN Outcome: Progressing 10/17/2022 2233 by Kizzie Bane, RN Outcome: Progressing   Problem: Pain Managment: Goal: General experience of comfort will improve 10/17/2022 2233 by Kizzie Bane, RN Outcome: Progressing 10/17/2022 2233 by Kizzie Bane, RN Outcome: Progressing

## 2022-10-17 NOTE — ED Notes (Signed)
ED TO INPATIENT HANDOFF REPORT  ED Nurse Name and Phone #: Thamas Jaegers Name/Age/Gender Steven Bradley 77 y.o. male Room/Bed: WA10/WA10  Code Status   Code Status: Full Code  Home/SNF/Other Home Patient oriented to: self, place, time, and situation Is this baseline? Yes   Triage Complete: Triage complete  Chief Complaint Sepsis secondary to UTI (HCC) [A41.9, N39.0]  Triage Note BIB EMS from home for weakness, fever at home for 3 days. Did at home UTI test yesterday that was positive. A&Ox4 on arrival.    Allergies Allergies  Allergen Reactions   Viagra [Sildenafil] Nausea And Vomiting   Ciprofloxacin Rash   Spironolactone Rash   Vitamin D Analogs Rash    Level of Care/Admitting Diagnosis ED Disposition     ED Disposition  Admit   Condition  --   Comment  Hospital Area: [facility] [[id number]]  Level of Care: Med-Surg [16]  May admit patient to [facility] or [facility] if equivalent level of care is available:: Yes  Covid Evaluation: Confirmed COVID Negative  Diagnosis: Sepsis secondary to UTI [facility]) [[id number]]  Admitting Physician: Maryln Gottron [[id number]]  Attending Physician: Olexa.Dam, MIR Jaxson.Roy [[id number]]  Certification:: I certify this patient will need inpatient services for at least 2 midnights  Estimated Length of Stay: 3          B Medical/Surgery History Past Medical History:  Diagnosis Date   Arthritis    Enlarged prostate    GERD (gastroesophageal reflux disease)    Gout    Patient doesn't recall having gout but was prescribed allopurinol for ankle swelling.   Hyperlipidemia    Hypertension    Prostate cancer [facility]) June 2015   PTSD (post-traumatic stress disorder)    Syncope    Negative outpatient workup.   Vitamin D deficiency    Past Surgical History:  Procedure Laterality Date   PROSTATE BIOPSY  June 2015   PROSTATE SURGERY     Right hand surgery     Scrapnel removal from scalp     During [place] war     A IV  Location/Drains/Wounds Patient Lines/Drains/Airways Status     Active Line/Drains/Airways     Name Placement date Placement time Site Days   Peripheral IV 10/17/22 22 G Anterior;Left;Proximal Forearm 10/17/22  1232  Forearm  less than 1            Intake/Output Last 24 hours  Intake/Output Summary (Last 24 hours) at 10/17/2022 1800 Last data filed at 10/17/2022 1759 Gross per 24 hour  Intake 100 ml  Output --  Net 100 ml    Labs/Imaging Results for orders placed or performed during the hospital encounter of 10/17/22 (from the past 48 hour(s))  CBC with Differential/Platelet     Status: Abnormal   Collection Time: 10/17/22 12:25 PM  Result Value Ref Range   WBC 10.6 (H) 4.0 - 10.5 K/uL   RBC 3.82 (L) 4.22 - 5.81 MIL/uL   Hemoglobin 13.0 13.0 - 17.0 g/dL   HCT 24.4 (L) 01.0 - 27.2 %   MCV 101.0 (H) 80.0 - 100.0 fL   MCH 34.0 26.0 - 34.0 pg   MCHC 33.7 30.0 - 36.0 g/dL   RDW 53.6 64.4 - 03.4 %   Platelets 119 (L) 150 - 400 K/uL   nRBC 0.0 0.0 - 0.2 %   Neutrophils Relative % 70 %   Neutro Abs 7.5 1.7 - 7.7 K/uL   Lymphocytes Relative 12 %  Lymphs Abs 1.2 0.7 - 4.0 K/uL   Monocytes Relative 16 %   Monocytes Absolute 1.7 (H) 0.1 - 1.0 K/uL   Eosinophils Relative 1 %   Eosinophils Absolute 0.1 0.0 - 0.5 K/uL   Basophils Relative 1 %   Basophils Absolute 0.1 0.0 - 0.1 K/uL   Immature Granulocytes 0 %   Abs Immature Granulocytes 0.04 0.00 - 0.07 K/uL    Comment: Performed at [facility], [address], [place], [place] [address]  Urinalysis, Routine w reflex microscopic -Urine, Clean Catch     Status: Abnormal   Collection Time: 10/17/22 12:33 PM  Result Value Ref Range   Color, Urine AMBER (A) YELLOW    Comment: BIOCHEMICALS MAY BE AFFECTED BY COLOR   APPearance HAZY (A) CLEAR   Specific Gravity, Urine 1.024 1.005 - 1.030   pH 6.0 5.0 - 8.0   Glucose, UA NEGATIVE NEGATIVE mg/dL   Hgb urine dipstick LARGE (A) NEGATIVE   Bilirubin Urine MODERATE  (A) NEGATIVE   Ketones, ur 5 (A) NEGATIVE mg/dL   Protein, ur >=191 (A) NEGATIVE mg/dL   Nitrite POSITIVE (A) NEGATIVE   Leukocytes,Ua SMALL (A) NEGATIVE   RBC / HPF >50 0 - 5 RBC/hpf   WBC, UA >50 0 - 5 WBC/hpf   Bacteria, UA RARE (A) NONE SEEN   Squamous Epithelial / HPF 0-5 0 - 5 /HPF   Mucus PRESENT     Comment: Performed at [facility], [address] [place], [place] [address]  I-Stat CG4 Lactic Acid     Status: Abnormal   Collection Time: 10/17/22 12:35 PM  Result Value Ref Range   Lactic Acid, Venous 2.0 (HH) 0.5 - 1.9 mmol/L  Resp panel by RT-PCR (RSV, Flu A&B, Covid) Anterior Nasal Swab     Status: None   Collection Time: 10/17/22 12:37 PM   Specimen: Anterior Nasal Swab  Result Value Ref Range   SARS Coronavirus 2 by RT PCR NEGATIVE NEGATIVE    Comment: (NOTE) SARS-CoV-2 target nucleic acids are NOT DETECTED.  The SARS-CoV-2 RNA is generally detectable in upper respiratory specimens during the acute phase of infection. The lowest concentration of SARS-CoV-2 viral copies this assay can detect is 138 copies/mL. A negative result does not preclude SARS-Cov-2 infection and should not be used as the sole basis for treatment or other patient management decisions. A negative result may occur with  improper specimen collection/handling, submission of specimen other than nasopharyngeal swab, presence of viral mutation(s) within the areas targeted by this assay, and inadequate number of viral copies(<138 copies/mL). A negative result must be combined with clinical observations, patient history, and epidemiological information. The expected result is Negative.  Fact Sheet for Patients:  BloggerCourse.com  Fact Sheet for Healthcare Providers:  SeriousBroker.it  This test is no t yet approved or cleared by the [place] FDA and  has been authorized for detection and/or diagnosis of SARS-CoV-2 by FDA  under an Emergency Use Authorization (EUA). This EUA will remain  in effect (meaning this test can be used) for the duration of the COVID-19 declaration under Section 564(b)(1) of the Act, 21 U.S.C.section 360bbb-3(b)(1), unless the authorization is terminated  or revoked sooner.       Influenza A by PCR NEGATIVE NEGATIVE   Influenza B by PCR NEGATIVE NEGATIVE    Comment: (NOTE) The Xpert Xpress SARS-CoV-2/FLU/RSV plus assay is intended as an aid in the diagnosis of influenza from Nasopharyngeal swab specimens and should not  be used as a sole basis for treatment. Nasal washings and aspirates are unacceptable for Xpert Xpress SARS-CoV-2/FLU/RSV testing.  Fact Sheet for Patients: BloggerCourse.com  Fact Sheet for Healthcare Providers: SeriousBroker.it  This test is not yet approved or cleared by the [place] FDA and has been authorized for detection and/or diagnosis of SARS-CoV-2 by FDA under an Emergency Use Authorization (EUA). This EUA will remain in effect (meaning this test can be used) for the duration of the COVID-19 declaration under Section 564(b)(1) of the Act, 21 U.S.C. section 360bbb-3(b)(1), unless the authorization is terminated or revoked.     Resp Syncytial Virus by PCR NEGATIVE NEGATIVE    Comment: (NOTE) Fact Sheet for Patients: BloggerCourse.com  Fact Sheet for Healthcare Providers: SeriousBroker.it  This test is not yet approved or cleared by the [place] FDA and has been authorized for detection and/or diagnosis of SARS-CoV-2 by FDA under an Emergency Use Authorization (EUA). This EUA will remain in effect (meaning this test can be used) for the duration of the COVID-19 declaration under Section 564(b)(1) of the Act, 21 U.S.C. section 360bbb-3(b)(1), unless the authorization is terminated or revoked.  Performed at [facility], [address], [place], [place] [address]   Comprehensive metabolic panel     Status: Abnormal   Collection Time: 10/17/22  1:36 PM  Result Value Ref Range   Sodium 133 (L) 135 - 145 mmol/L   Potassium 4.0 3.5 - 5.1 mmol/L    Comment: HEMOLYSIS AT THIS LEVEL MAY AFFECT RESULT   Chloride 99 98 - 111 mmol/L   CO2 20 (L) 22 - 32 mmol/L   Glucose, Bld 117 (H) 70 - 99 mg/dL    Comment: Glucose reference range applies only to samples taken after fasting for at least 8 hours.   BUN 15 8 - 23 mg/dL   Creatinine, Ser 2.72 0.61 - 1.24 mg/dL   Calcium 8.8 (L) 8.9 - 10.3 mg/dL   Total Protein 7.7 6.5 - 8.1 g/dL   Albumin 3.0 (L) 3.5 - 5.0 g/dL   AST 536 (H) 15 - 41 U/L    Comment: HEMOLYSIS AT THIS LEVEL MAY AFFECT RESULT   ALT 85 (H) 0 - 44 U/L    Comment: HEMOLYSIS AT THIS LEVEL MAY AFFECT RESULT   Alkaline Phosphatase 192 (H) 38 - 126 U/L   Total Bilirubin 4.4 (H) 0.3 - 1.2 mg/dL    Comment: HEMOLYSIS AT THIS LEVEL MAY AFFECT RESULT   GFR, Estimated >60 >60 mL/min    Comment: (NOTE) Calculated using the CKD-EPI Creatinine Equation (2021)    Anion gap 14 5 - 15    Comment: Performed at [facility], [address], [place], [place] [address]  I-Stat CG4 Lactic Acid     Status: None   Collection Time: 10/17/22  2:30 PM  Result Value Ref Range   Lactic Acid, Venous 0.6 0.5 - 1.9 mmol/L   US Abdomen Limited RUQ (LIVER/GB)  Result Date: 10/17/2022 CLINICAL DATA:  Elevated liver function tests. EXAM: ULTRASOUND ABDOMEN LIMITED RIGHT UPPER QUADRANT COMPARISON:  CT abdomen pelvis dated 06/09/2016 and abdominal ultrasound dated 12/30/2013. FINDINGS: Gallbladder: No gallstones or wall thickening visualized. No sonographic Murphy sign noted by sonographer. Common bile duct: Diameter: 4 mm Liver: No focal lesion identified. Increased parenchymal echogenicity and coarsened echotexture. Portal vein is patent on color Doppler imaging with normal direction of blood flow  towards the liver. Other: None. IMPRESSION: Increased hepatic parenchymal echogenicity and coarsened echotexture  likely reflects hepatic steatosis and/or cirrhosis. No biliary dilatation. Electronically Signed   By: Romona Curls M.D.   On: 10/17/2022 16:24    Pending Labs Unresulted Labs (From admission, onward)     Start     Ordered   10/18/22 0500  Basic metabolic panel  Tomorrow morning,   R        10/17/22 1520   10/18/22 0500  CBC  Tomorrow morning,   R        10/17/22 1520   10/17/22 1528  Urine Culture (for pregnant, neutropenic or urologic patients or patients with an indwelling urinary catheter)  (Urine Labs)  Once,   R       Question:  Indication  Answer:  Sepsis   10/17/22 1528   10/17/22 1527  Hepatitis panel, acute  Once,   R        10/17/22 1526   10/17/22 1214  Culture, blood (Routine X 2) w Reflex to ID Panel  BLOOD CULTURE X 2,   R     Question:  Patient immune status  Answer:  Normal   10/17/22 1213            Vitals/Pain Today's Vitals   10/17/22 1530 10/17/22 1600 10/17/22 1630 10/17/22 1700  BP: 132/87 133/86 138/88 (!) 139/93  Pulse: 100 98 96 92  Resp: 20 19 19 19   Temp:      TempSrc:      SpO2: 96% 98% 97% 97%  Weight:      Height:      PainSc:        Isolation Precautions No active isolations  Medications Medications  lactated ringers infusion ( Intravenous New Bag/Given 10/17/22 1237)  enoxaparin (LOVENOX) injection 40 mg (has no administration in time range)  ondansetron (ZOFRAN) tablet 4 mg (has no administration in time range)    Or  ondansetron (ZOFRAN) injection 4 mg (has no administration in time range)  albuterol (PROVENTIL) (2.5 MG/3ML) 0.083% nebulizer solution 2.5 mg (has no administration in time range)  amLODipine (NORVASC) tablet 5 mg (has no administration in time range)  atorvastatin (LIPITOR) tablet 40 mg (has no administration in time range)  sertraline (ZOLOFT) tablet 100 mg (has no administration in time range)   traZODone (DESYREL) tablet 100 mg (has no administration in time range)  pantoprazole (PROTONIX) EC tablet 40 mg (has no administration in time range)  gabapentin (NEURONTIN) capsule 100 mg (100 mg Oral Given 10/17/22 1635)  levETIRAcetam (KEPPRA) tablet 1,000 mg (has no administration in time range)  cefTRIAXone (ROCEPHIN) 1 g in sodium chloride 0.9 % 100 mL IVPB (0 g Intravenous Stopped 10/17/22 1759)  ibuprofen (ADVIL) tablet 600 mg (has no administration in time range)  acetaminophen (TYLENOL) tablet 650 mg (650 mg Oral Given 10/17/22 1237)    Mobility walks with device     Focused Assessments     R Recommendations: See Admitting Provider Note  Report given to:   Additional Notes:

## 2022-10-18 DIAGNOSIS — N390 Urinary tract infection, site not specified: Secondary | ICD-10-CM | POA: Diagnosis not present

## 2022-10-18 DIAGNOSIS — A419 Sepsis, unspecified organism: Secondary | ICD-10-CM | POA: Diagnosis not present

## 2022-10-18 LAB — HEPATIC FUNCTION PANEL
ALT: 67 U/L — ABNORMAL HIGH (ref 0–44)
AST: 93 U/L — ABNORMAL HIGH (ref 15–41)
Albumin: 2.7 g/dL — ABNORMAL LOW (ref 3.5–5.0)
Alkaline Phosphatase: 165 U/L — ABNORMAL HIGH (ref 38–126)
Bilirubin, Direct: 1.9 mg/dL — ABNORMAL HIGH (ref 0.0–0.2)
Indirect Bilirubin: 1.3 mg/dL — ABNORMAL HIGH (ref 0.3–0.9)
Total Bilirubin: 3.2 mg/dL — ABNORMAL HIGH (ref 0.3–1.2)
Total Protein: 7 g/dL (ref 6.5–8.1)

## 2022-10-18 LAB — URIC ACID: Uric Acid, Serum: 5.5 mg/dL (ref 3.7–8.6)

## 2022-10-18 MED ORDER — POTASSIUM CHLORIDE CRYS ER 20 MEQ PO TBCR
30.0000 meq | EXTENDED_RELEASE_TABLET | Freq: Once | ORAL | Status: AC
  Administered 2022-10-18: 30 meq via ORAL
  Filled 2022-10-18: qty 1

## 2022-10-18 MED ORDER — COLCHICINE 0.6 MG PO TABS
0.6000 mg | ORAL_TABLET | Freq: Every day | ORAL | Status: DC
  Administered 2022-10-18 – 2022-10-20 (×3): 0.6 mg via ORAL
  Filled 2022-10-18 (×3): qty 1

## 2022-10-18 MED ORDER — SODIUM CHLORIDE 0.9 % IV SOLN: INTRAVENOUS | Status: DC

## 2022-10-18 NOTE — Evaluation (Signed)
Physical Therapy Evaluation Patient Details Name: Steven Bradley MRN: [id number] DOB: [birth year] Today's Date: 10/18/2022  History of Present Illness  Patient is a 77 year old male with medical history significant for alcoholism with alcoholic liver cirrhosis, hypertension, hyperlipidemia, prostate cancer, epilepsy being admitted to the hospital with sepsis due to urinary tract infection.  Clinical Impression  Pt admitted with above diagnosis. Min assist for supine to sit, pt sat edge of bed for ~10 minutes. At baseline he requires assistance of 2 people to transfer to a WC. Pt did not feel he could attempt standing today 2* 10/10 pain in RUE/RLE, he attributes the pain to gout.  Pt currently with functional limitations due to the deficits listed below (see PT Problem List). Pt will benefit from acute skilled PT to increase their independence and safety with mobility to allow discharge.           If plan is discharge home, recommend the following: Two people to help with walking and/or transfers;A lot of help with bathing/dressing/bathroom;Assistance with cooking/housework;Help with stairs or ramp for entrance;Assist for transportation   Can travel by private vehicle        Equipment Recommendations None recommended by PT  Recommendations for Other Services       Functional Status Assessment Patient has had a recent decline in their functional status and demonstrates the ability to make significant improvements in function in a reasonable and predictable amount of time.     Precautions / Restrictions Precautions Precautions: Fall Restrictions Weight Bearing Restrictions: No      Mobility  Bed Mobility Overal bed mobility: Needs Assistance Bed Mobility: Supine to Sit, Sit to Supine     Supine to sit: Min assist, HOB elevated Sit to supine: Mod assist, +2 for physical assistance   General bed mobility comments: assist to pivot hips, able to advance BLEs, HOB up, increased  time, used rail; pt sat EOB x 10 minutes    Transfers                   General transfer comment: NT- pt unable to attempt 2* 10/10 gout pain    Ambulation/Gait               General Gait Details: NT  Stairs            Wheelchair Mobility     Tilt Bed    Modified Rankin (Stroke Patients Only)       Balance Overall balance assessment: Needs assistance Sitting-balance support: Feet supported, No upper extremity supported Sitting balance-Leahy Scale: Fair                                       Pertinent Vitals/Pain Pain Assessment Pain Assessment: 0-10 Pain Score: 10-Worst pain ever Pain Location: RUE and RLE gout pain Pain Descriptors / Indicators: Grimacing, Guarding Pain Intervention(s): Limited activity within patient's tolerance, Monitored during session, Repositioned, Premedicated before session    Home Living Family/patient expects to be discharged to:: Private residence Living Arrangements: Spouse/significant other Available Help at Discharge: Family;Available 24 hours/day Type of Home: House Home Access: Ramped entrance     Alternate Level Stairs-Number of Steps: chair lift to go to bedroom Home Layout: Multi-level Home Equipment: Shower seat;BSC/3in1;Wheelchair - manual Additional Comments: patient reported that he is getting fitted for motorized scooter and getting handicapped Zenaida Niece in september.    Prior Function Prior Level  of Function : Needs assist             Mobility Comments: requires assist of 2 for WC transfers, able to sit edge of bed at baseline ADLs Comments: uses urinal and has "pads" for BM at bed level at home. "they pick me up and put me in there" for showers.     Hand Dominance   Dominant Hand: Right    Extremity/Trunk Assessment   Upper Extremity Assessment Upper Extremity Assessment: Defer to OT evaluation RUE Deficits / Details: unable to close hand to make fist with about 75% of fist  made. patient with increased pain with movement able to extend all digits and wrist to neutral AROM. patient declined to move shoulders secondary to pain and declined for therapist to attempt to move. patient elbows ROM WFL. RUE: Unable to fully assess due to pain LUE Deficits / Details: able to creat about 75% of fist with full finger width between palm and fingers. LUE: Unable to fully assess due to pain    Lower Extremity Assessment Lower Extremity Assessment: Generalized weakness;RLE deficits/detail;LLE deficits/detail RLE Deficits / Details: B feet numb to light touch, B knee ext AROM -10*, B knee ext strength 4/5 RLE Sensation: history of peripheral neuropathy;decreased light touch LLE Deficits / Details: B feet numb to light touch, B knee ext AROM -10*, B knee ext strength 4/5 LLE Sensation: decreased light touch;history of peripheral neuropathy       Communication   Communication: No difficulties  Cognition Arousal/Alertness: Awake/alert Behavior During Therapy: WFL for tasks assessed/performed Overall Cognitive Status: Within Functional Limits for tasks assessed                                          General Comments      Exercises General Exercises - Lower Extremity Ankle Circles/Pumps: AROM, Both, 10 reps, Seated   Assessment/Plan    PT Assessment Patient needs continued PT services  PT Problem List Decreased activity tolerance;Decreased balance;Decreased mobility;Impaired sensation       PT Treatment Interventions Functional mobility training;Therapeutic activities;Patient/family education;Therapeutic exercise;Balance training    PT Goals (Current goals can be found in the Care Plan section)  Acute Rehab PT Goals Patient Stated Goal: to be able to walk PT Goal Formulation: With patient Time For Goal Achievement: 11/01/22 Potential to Achieve Goals: Fair    Frequency Min 1X/week     Co-evaluation               AM-PAC PT "6  Clicks" Mobility  Outcome Measure Help needed turning from your back to your side while in a flat bed without using bedrails?: A Little Help needed moving from lying on your back to sitting on the side of a flat bed without using bedrails?: A Little Help needed moving to and from a bed to a chair (including a wheelchair)?: Total Help needed standing up from a chair using your arms (e.g., wheelchair or bedside chair)?: Total Help needed to walk in hospital room?: Total Help needed climbing 3-5 steps with a railing? : Total 6 Click Score: 10    End of Session Equipment Utilized During Treatment: Gait belt Activity Tolerance: Patient tolerated treatment well Patient left: in bed;with call bell/phone within reach;with bed alarm set Nurse Communication: Mobility status PT Visit Diagnosis: History of falling (Z91.81);Difficulty in walking, not elsewhere classified (R26.2);Other abnormalities of gait and mobility (R26.89)  Time: 0981-1914 PT Time Calculation (min) (ACUTE ONLY): 18 min   Charges:   PT Evaluation $PT Eval Moderate Complexity: 1 Mod   PT General Charges $$ ACUTE PT VISIT: 1 Visit         Tamala Ser PT 10/18/2022  Acute Rehabilitation Services  Office [phone]

## 2022-10-18 NOTE — Progress Notes (Signed)
PROGRESS NOTE    Steven Bradley  ZOX:[id number] DOB: [birth year] DOA: 10/17/2022 PCP: [facility]    Brief Narrative:   Steven Bradley is a 77 y.o. male with past medical history significant for EtOH use disorder, EtOH cirrhosis, HTN, HLD, prostate cancer, epilepsy who presented to [facility] ED on 8/4 from home via EMS for weakness, fever.  Onset 3 days prior to admission.  He reports feeling tired, increased frequency of urination and mild dysuria.  Reports fevers as high as 103.0 F.  Denied abdominal pain, no nausea/vomiting, no cough, no shortness of breath.  Given his persistent symptoms he sought further care in the ED.  In the ED, temperature 102.0 F, HR 105, RR 24, BP 130/85, SpO2 98% on room air.  WBC 10.6, hemoglobin 13.0, platelets 119.  Sodium 133, potassium 4.0, chloride 99, CO2 20, glucose 117, BUN 15, creatinine 0.72.  AST 147, ALT 85, total bilirubin 4.4.  Lactic acid 2.0.  COVID/influenza/RSV PCR negative.  Urinalysis with small leukocytes, positive nitrite, rare bacteria, greater than 50 WBCs. Right upper quadrant ultrasound with increased hepatic parenchymal echogenicity and coarsened echotexture likely reflecting hepatic steatosis and/or cirrhosis, no biliary dilation.  Blood cultures x 2 and urine culture obtained.  Patient was started on empiric antibiotics and IV fluids.  TRH consulted for admission for further evaluation and management of severe sepsis secondary to UTI.  Assessment & Plan:   Severe sepsis, POA Urine tract infection Patient presenting to ED with progressive fatigue, increased urinary frequency/dysuria and fever.  Temperature 102.0 3 Fahrenheit, tachycardic, tachypneic with elevated total bilirubin and lactic acid.  Urinalysis consistent with urinary tract infection. -- WBC 10.6>9.0 -- Lactic acid 2.0>0.6 -- Blood cultures x 2: no growth < 24h -- Urine culture: Pending -- Ceftriaxone 1g IV q24h -- NS at 100 mL/h -- Repeat CBC in  a.m.  Hypovolemic hyponatremia Sodium 133 on admission, likely secondary to poor oral intake/dehydration in days preceding hospitalization. -- Na 133>132 -- Change LR to NS at 100 mL/h -- BMP daily  Essential hypertension -- Amlodipine 5 mg p.o. daily  Hyperlipidemia -- Atorvastatin 40 mg p.o. daily  Epilepsy -- Keppra 1000 mg p.o. twice daily  Depression -- Zoloft 100 mg p.o. daily  GERD -- Protonix 40 mg p.o. daily  Weakness/debility/deconditioning: -- PT/OT evaluation   DVT prophylaxis: enoxaparin (LOVENOX) injection 40 mg Start: 10/17/22 2200 SCDs Start: 10/17/22 1520    Code Status: Full Code Family Communication: Updated patient's spouse Steven Bradley via telephone this morning  Disposition Plan:  Level of care: Med-Surg Status is: Inpatient Remains inpatient appropriate because: IV antibiotics, awaiting urine culture identification/susceptibilities, PT evaluation, anticipate discharge home in likely 1-2 days  Consultants:  None  Procedures:  None  Antimicrobials:  Ceftriaxone 8/4>   Subjective: Patient seen examined bedside, resting calmly.  Eating breakfast.  No specific complaints this morning.  No family present at bedside, updated spouse Steven Bradley via telephone this morning.  Overall feels much improved.  Remains on IV antibiotics awaiting urine culture identification/susceptibilities.  No other specific questions or concerns at this time other than some mild generalized weakness.  Awaiting PT/OT evaluation.  Denies headache, no dizziness, no chest pain, no palpitations, no shortness of breath, no abdominal pain, no current fever, no chills/night sweats, no nausea/vomiting/diarrhea, no focal weakness, no fatigue, no paresthesias.  No acute events overnight per nurse staff.  Objective: Vitals:   10/17/22 1841 10/17/22 2244 10/18/22 0133 10/18/22 0532  BP: (!) 145/82 94/66 95/70  125/79  Pulse: (!) 108 100 73 80  Resp: 16 18 17 16   Temp: 98.4 F (36.9 C) 98.4  F (36.9 C) (!) 97.4 F (36.3 C) 98.2 F (36.8 C)  TempSrc: Oral Oral Oral Oral  SpO2: 99% 100% 99% 100%  Weight:      Height:        Intake/Output Summary (Last 24 hours) at 10/18/2022 1028 Last data filed at 10/18/2022 0500 Gross per 24 hour  Intake 1871 ml  Output 300 ml  Net 1571 ml   Filed Weights   10/17/22 1150  Weight: 87.1 kg    Examination:  Physical Exam: GEN: NAD, alert and oriented x 3, elderly in appearance HEENT: NCAT, PERRL, EOMI, sclera clear, MMM PULM: CTAB w/o wheezes/crackles, normal respiratory effort, on room air CV: RRR w/o M/G/R GI: abd soft, NTND, NABS, no R/G/M MSK: no peripheral edema, muscle strength globally intact 5/5 bilateral upper/lower extremities NEURO: CN II-XII intact, no focal deficits, sensation to light touch intact PSYCH: normal mood/affect Integumentary: dry/intact, no rashes or wounds    Data Reviewed: I have personally reviewed following labs and imaging studies  CBC: Recent Labs  Lab 10/17/22 1225 10/18/22 0314  WBC 10.6* 9.0  NEUTROABS 7.5  --   HGB 13.0 11.2*  HCT 38.6* 34.8*  MCV 101.0* 107.4*  PLT 119* 129*   Basic Metabolic Panel: Recent Labs  Lab 10/17/22 1336 10/18/22 0314  NA 133* 132*  K 4.0 3.6  CL 99 100  CO2 20* 22  GLUCOSE 117* 102*  BUN 15 19  CREATININE 0.72 1.01  CALCIUM 8.8* 8.5*   GFR: Estimated Creatinine Clearance: 77.9 mL/min (by C-G formula based on SCr of 1.01 mg/dL). Liver Function Tests: Recent Labs  Lab 10/17/22 1336 10/18/22 0314  AST 147* 93*  ALT 85* 67*  ALKPHOS 192* 165*  BILITOT 4.4* 3.2*  PROT 7.7 7.0  ALBUMIN 3.0* 2.7*   No results for input(s): "LIPASE", "AMYLASE" in the last 168 hours. No results for input(s): "AMMONIA" in the last 168 hours. Coagulation Profile: No results for input(s): "INR", "PROTIME" in the last 168 hours. Cardiac Enzymes: No results for input(s): "CKTOTAL", "CKMB", "CKMBINDEX", "TROPONINI" in the last 168 hours. BNP (last 3  results) No results for input(s): "PROBNP" in the last 8760 hours. HbA1C: No results for input(s): "HGBA1C" in the last 72 hours. CBG: No results for input(s): "GLUCAP" in the last 168 hours. Lipid Profile: No results for input(s): "CHOL", "HDL", "LDLCALC", "TRIG", "CHOLHDL", "LDLDIRECT" in the last 72 hours. Thyroid Function Tests: No results for input(s): "TSH", "T4TOTAL", "FREET4", "T3FREE", "THYROIDAB" in the last 72 hours. Anemia Panel: No results for input(s): "VITAMINB12", "FOLATE", "FERRITIN", "TIBC", "IRON", "RETICCTPCT" in the last 72 hours. Sepsis Labs: Recent Labs  Lab 10/17/22 1235 10/17/22 1430  LATICACIDVEN 2.0* 0.6    Recent Results (from the past 240 hour(s))  Culture, blood (Routine X 2) w Reflex to ID Panel     Status: None (Preliminary result)   Collection Time: 10/17/22 12:20 PM   Specimen: BLOOD  Result Value Ref Range Status   Specimen Description   Final    BLOOD LEFT ANTECUBITAL Performed at [facility], [address], [place], [place] [address]    Special Requests   Final    BOTTLES DRAWN AEROBIC AND ANAEROBIC Blood Culture results may not be optimal due to an inadequate volume of blood received in culture bottles Performed at [facility], [address] [place], [place] [address]  Culture   Final    NO GROWTH < 24 HOURS Performed at [facility], [address] [place], [place] [address]    Report Status PENDING  Incomplete  Culture, blood (Routine X 2) w Reflex to ID Panel     Status: None (Preliminary result)   Collection Time: 10/17/22 12:25 PM   Specimen: BLOOD  Result Value Ref Range Status   Specimen Description   Final    BLOOD BLOOD LEFT FOREARM Performed at [facility], [address], [place], [place] [address]    Special Requests   Final    BOTTLES DRAWN AEROBIC AND ANAEROBIC Blood Culture results may not be optimal due to an inadequate volume of blood received  in culture bottles Performed at [facility], [address], [place], [place] [address]    Culture   Final    NO GROWTH < 24 HOURS Performed at [facility], [address], [place], [place] [address]    Report Status PENDING  Incomplete  Resp panel by RT-PCR (RSV, Flu A&B, Covid) Anterior Nasal Swab     Status: None   Collection Time: 10/17/22 12:37 PM   Specimen: Anterior Nasal Swab  Result Value Ref Range Status   SARS Coronavirus 2 by RT PCR NEGATIVE NEGATIVE Final    Comment: (NOTE) SARS-CoV-2 target nucleic acids are NOT DETECTED.  The SARS-CoV-2 RNA is generally detectable in upper respiratory specimens during the acute phase of infection. The lowest concentration of SARS-CoV-2 viral copies this assay can detect is 138 copies/mL. A negative result does not preclude SARS-Cov-2 infection and should not be used as the sole basis for treatment or other patient management decisions. A negative result may occur with  improper specimen collection/handling, submission of specimen other than nasopharyngeal swab, presence of viral mutation(s) within the areas targeted by this assay, and inadequate number of viral copies(<138 copies/mL). A negative result must be combined with clinical observations, patient history, and epidemiological information. The expected result is Negative.  Fact Sheet for Patients:  BloggerCourse.com  Fact Sheet for Healthcare Providers:  SeriousBroker.it  This test is no t yet approved or cleared by the [place] FDA and  has been authorized for detection and/or diagnosis of SARS-CoV-2 by FDA under an Emergency Use Authorization (EUA). This EUA will remain  in effect (meaning this test can be used) for the duration of the COVID-19 declaration under Section 564(b)(1) of the Act, 21 U.S.C.section 360bbb-3(b)(1), unless the authorization is terminated  or revoked sooner.        Influenza A by PCR NEGATIVE NEGATIVE Final   Influenza B by PCR NEGATIVE NEGATIVE Final    Comment: (NOTE) The Xpert Xpress SARS-CoV-2/FLU/RSV plus assay is intended as an aid in the diagnosis of influenza from Nasopharyngeal swab specimens and should not be used as a sole basis for treatment. Nasal washings and aspirates are unacceptable for Xpert Xpress SARS-CoV-2/FLU/RSV testing.  Fact Sheet for Patients: BloggerCourse.com  Fact Sheet for Healthcare Providers: SeriousBroker.it  This test is not yet approved or cleared by the [place] FDA and has been authorized for detection and/or diagnosis of SARS-CoV-2 by FDA under an Emergency Use Authorization (EUA). This EUA will remain in effect (meaning this test can be used) for the duration of the COVID-19 declaration under Section 564(b)(1) of the Act, 21 U.S.C. section 360bbb-3(b)(1), unless the authorization is terminated or revoked.     Resp Syncytial Virus by PCR NEGATIVE NEGATIVE Final  Comment: (NOTE) Fact Sheet for Patients: BloggerCourse.com  Fact Sheet for Healthcare Providers: SeriousBroker.it  This test is not yet approved or cleared by the [place] FDA and has been authorized for detection and/or diagnosis of SARS-CoV-2 by FDA under an Emergency Use Authorization (EUA). This EUA will remain in effect (meaning this test can be used) for the duration of the COVID-19 declaration under Section 564(b)(1) of the Act, 21 U.S.C. section 360bbb-3(b)(1), unless the authorization is terminated or revoked.  Performed at [facility], [address] [place], [place] [address]          Radiology Studies: US Abdomen Limited RUQ (LIVER/GB)  Result Date: 10/17/2022 CLINICAL DATA:  Elevated liver function tests. EXAM: ULTRASOUND ABDOMEN LIMITED RIGHT UPPER QUADRANT COMPARISON:  CT abdomen  pelvis dated 06/09/2016 and abdominal ultrasound dated 12/30/2013. FINDINGS: Gallbladder: No gallstones or wall thickening visualized. No sonographic Murphy sign noted by sonographer. Common bile duct: Diameter: 4 mm Liver: No focal lesion identified. Increased parenchymal echogenicity and coarsened echotexture. Portal vein is patent on color Doppler imaging with normal direction of blood flow towards the liver. Other: None. IMPRESSION: Increased hepatic parenchymal echogenicity and coarsened echotexture likely reflects hepatic steatosis and/or cirrhosis. No biliary dilatation. Electronically Signed   By: Romona Curls M.D.   On: 10/17/2022 16:24        Scheduled Meds:  amLODipine  5 mg Oral Daily   atorvastatin  40 mg Oral QHS   enoxaparin (LOVENOX) injection  40 mg Subcutaneous Q24H   gabapentin  100 mg Oral TID   levETIRAcetam  1,000 mg Oral BID   pantoprazole  40 mg Oral Daily   sertraline  100 mg Oral Daily   traZODone  100 mg Oral QHS   Continuous Infusions:  cefTRIAXone (ROCEPHIN)  IV 1 g (10/18/22 0946)   lactated ringers 125 mL/hr at 10/17/22 2112     LOS: 1 day    Time spent: 52 minutes spent on chart review, discussion with nursing staff, consultants, updating family and interview/physical exam; more than 50% of that time was spent in counseling and/or coordination of care.    Alvira Philips [place], DO [facility] Available via Epic secure chat 7am-7pm After these hours, please refer to coverage provider listed on [email or web address] 10/18/2022, 10:28 AM

## 2022-10-18 NOTE — Evaluation (Signed)
Occupational Therapy Evaluation Patient Details Name: Steven Bradley MRN: [id number] DOB: [birth year] Today's Date: 10/18/2022   History of Present Illness Patient is a 77 year old male with medical history significant for alcoholism with alcoholic liver cirrhosis, hypertension, hyperlipidemia, prostate cancer, epilepsy being admitted to the hospital with sepsis due to urinary tract infection.   Clinical Impression   Patient is a 77 year old male who was admitted for above. Patient was living at home with max A for Adls and +2 for transfers with wife support. Patient reported " my wife is my 24 hour CNA". Patient was noted to have increased pain in BUE and limited ROM impacting ability to engage in self feeding and grooming tasks leading to increased risks of learned helplessness. Patient was provided with red foam handles for utensils during this session. Nursing made aware. OT to continue to work with patient for self feeding and grooming to maintain ability to engage in ADLs. Anticipate patient will be at baseline at time of d/c without need for OT in next level of care.       Recommendations for follow up therapy are one component of a multi-disciplinary discharge planning process, led by the attending physician.  Recommendations may be updated based on patient status, additional functional criteria and insurance authorization.   Assistance Recommended at Discharge Frequent or constant Supervision/Assistance  Patient can return home with the following Two people to help with walking and/or transfers;Two people to help with bathing/dressing/bathroom;Assistance with feeding;Direct supervision/assist for financial management;Help with stairs or ramp for entrance;Direct supervision/assist for medications management;Assist for transportation;Assistance with cooking/housework    Functional Status Assessment  Patient has had a recent decline in their functional status and/or demonstrates limited  ability to make significant improvements in function in a reasonable and predictable amount of time  Equipment Recommendations  Other (comment) (total lift)       Precautions / Restrictions Precautions Precautions: Fall Restrictions Weight Bearing Restrictions: No      Mobility Bed Mobility Overal bed mobility: Needs Assistance Bed Mobility: Supine to Sit, Sit to Supine     Supine to sit: Min assist, HOB elevated Sit to supine: Mod assist, +2 for physical assistance   General bed mobility comments: assist to pivot hips, able to advance BLEs, HOB up, increased time, used rail; pt sat EOB x 10 minutes       Balance Overall balance assessment: Needs assistance Sitting-balance support: Feet supported, No upper extremity supported Sitting balance-Leahy Scale: Fair         ADL either performed or assessed with clinical judgement   ADL Overall ADL's : Needs assistance/impaired   Eating/Feeding Details (indicate cue type and reason): patient with limited ROM of hands with increased pain patient reporting "gout" in R side and L arm. patient was provided with red foam handles to increase independence in ability to enagage in self feeding tasks. patient was able to pick up plastic cup and bring to mouth with R hand. R side has increased pain with all movements. patient reported he goes to pain clinic. Grooming: Minimal assistance;Sitting Grooming Details (indicate cue type and reason): patient unable to touch top of head Upper Body Bathing: Sitting;Moderate assistance   Lower Body Bathing: Total assistance;Bed level   Upper Body Dressing : Moderate assistance;Sitting   Lower Body Dressing: Bed level;Total assistance     Toilet Transfer Details (indicate cue type and reason): patient was =2 to transition to EOB with patient declining standing or transfer into recliner. patient reported  wife and brother in law pick him up and put him places at home Toileting- Architect  and Hygiene: Bed level;Total assistance               Vision   Vision Assessment?: No apparent visual deficits            Pertinent Vitals/Pain Pain Assessment Pain Assessment: 0-10 Pain Score: 10-Worst pain ever Pain Location: RUE and RLE gout pain Pain Descriptors / Indicators: Grimacing, Guarding Pain Intervention(s): Limited activity within patient's tolerance, Monitored during session, Premedicated before session, Repositioned     Hand Dominance Right   Extremity/Trunk Assessment Upper Extremity Assessment Upper Extremity Assessment: Defer to OT evaluation RUE Deficits / Details: unable to close hand to make fist with about 75% of fist made. patient with increased pain with movement able to extend all digits and wrist to neutral AROM. patient declined to move shoulders secondary to pain and declined for therapist to attempt to move. patient elbows ROM WFL. RUE: Unable to fully assess due to pain LUE Deficits / Details: unable to close hand to make fist with about 75% of fist made. patient with increased pain with movement able to extend all digits and wrist to neutral AROM. patient declined to move shoulders secondary to pain and declined for therapist to attempt to move. patient elbows ROM WFL. LUE: Unable to fully assess due to pain   Lower Extremity Assessment Lower Extremity Assessment: Generalized weakness;RLE deficits/detail;LLE deficits/detail RLE Deficits / Details: B feet numb to light touch, B knee ext AROM -10*, B knee ext strength 4/5 RLE Sensation: history of peripheral neuropathy;decreased light touch LLE Deficits / Details: B feet numb to light touch, B knee ext AROM -10*, B knee ext strength 4/5 LLE Sensation: decreased light touch;history of peripheral neuropathy       Communication Communication Communication: No difficulties   Cognition Arousal/Alertness: Awake/alert Behavior During Therapy: WFL for tasks assessed/performed Overall Cognitive  Status: Within Functional Limits for tasks assessed                                 General Comments: had some confusion during session but appropriate overall                Home Living Family/patient expects to be discharged to:: Private residence Living Arrangements: Spouse/significant other Available Help at Discharge: Family;Available 24 hours/day Type of Home: House Home Access: Ramped entrance     Home Layout: Multi-level Alternate Level Stairs-Number of Steps: chair lift to go to bedroom   Bathroom Shower/Tub: Walk-in shower         Home Equipment: Shower seat;BSC/3in1;Wheelchair - manual   Additional Comments: patient reported that he is getting fitted for motorized scooter and getting handicapped Zenaida Niece in september.      Prior Functioning/Environment Prior Level of Function : Needs assist             Mobility Comments: requires assist of 2 for WC transfers, able to sit edge of bed at baseline ADLs Comments: uses urinal and has "pads" for BM at bed level at home. "they pick me up and put me in there" for showers.        OT Problem List: Decreased activity tolerance;Decreased strength;Impaired balance (sitting and/or standing);Decreased coordination;Decreased safety awareness;Decreased knowledge of precautions;Impaired sensation;Pain;Impaired UE functional use;Decreased knowledge of use of DME or AE      OT Treatment/Interventions: Energy conservation;Self-care/ADL training;Therapeutic exercise;DME and/or AE  instruction;Therapeutic activities;Patient/family education;Balance training    OT Goals(Current goals can be found in the care plan section) Acute Rehab OT Goals Patient Stated Goal: to go back home OT Goal Formulation: Patient unable to participate in goal setting Time For Goal Achievement: 11/01/22 Potential to Achieve Goals: Fair  OT Frequency: Min 1X/week    Co-evaluation PT/OT/SLP Co-Evaluation/Treatment: Yes Reason for  Co-Treatment: To address functional/ADL transfers PT goals addressed during session: Mobility/safety with mobility OT goals addressed during session: ADL's and self-care      AM-PAC OT "6 Clicks" Daily Activity     Outcome Measure Help from another person eating meals?: A Little Help from another person taking care of personal grooming?: A Little Help from another person toileting, which includes using toliet, bedpan, or urinal?: Total Help from another person bathing (including washing, rinsing, drying)?: Total Help from another person to put on and taking off regular upper body clothing?: Total Help from another person to put on and taking off regular lower body clothing?: Total 6 Click Score: 10   End of Session Nurse Communication: Mobility status  Activity Tolerance: Patient limited by pain Patient left: in bed;with call bell/phone within reach;with bed alarm set  OT Visit Diagnosis: Unsteadiness on feet (R26.81);Other abnormalities of gait and mobility (R26.89);Pain;Feeding difficulties (R63.3);History of falling (Z91.81);Muscle weakness (generalized) (M62.81)                Time: 9604-5409 OT Time Calculation (min): 40 min Charges:  OT General Charges $OT Visit: 1 Visit OT Treatments $Therapeutic Activity: 23-37 mins  Rosalio Loud, MS Acute Rehabilitation Department Office# [phone]   Selinda Flavin 10/18/2022, 12:53 PM

## 2022-10-19 ENCOUNTER — Other Ambulatory Visit: Payer: Self-pay

## 2022-10-19 DIAGNOSIS — N390 Urinary tract infection, site not specified: Secondary | ICD-10-CM | POA: Diagnosis not present

## 2022-10-19 DIAGNOSIS — A419 Sepsis, unspecified organism: Secondary | ICD-10-CM | POA: Diagnosis not present

## 2022-10-19 MED ORDER — POTASSIUM CHLORIDE CRYS ER 20 MEQ PO TBCR
30.0000 meq | EXTENDED_RELEASE_TABLET | ORAL | Status: AC
  Administered 2022-10-19 (×2): 30 meq via ORAL
  Filled 2022-10-19 (×2): qty 1

## 2022-10-19 MED ORDER — MAGNESIUM SULFATE 4 GM/100ML IV SOLN
4.0000 g | Freq: Once | INTRAVENOUS | Status: AC
  Administered 2022-10-19: 4 g via INTRAVENOUS
  Filled 2022-10-19: qty 100

## 2022-10-19 NOTE — TOC Initial Note (Signed)
Transition of Care [facility]) - Initial/Assessment Note    Patient Details  Name: Steven Bradley MRN: [id number] Date of Birth: [birth year]  Transition [facility]) CM/SW Contact:    Amada Jupiter, LCSW Phone Number: 10/19/2022, 2:47 PM  Clinical Narrative:                  Met with pt today to begin dc planning discussion.  MD has placed orders for HHPT follow up.  Pt confirms that his wife is in the home with him and able to assist but he does ask that I speak with her as well.  Pt is agreeable with HHPT and confirms he has had [facility] before - referral placed with [facility].  Have attempted to reach wife x 2 but VM is full and unable to leave message.  Will continue to follow.  Expected Discharge Plan: Home w Home Health Services Barriers to Discharge: Continued Medical Work up   Patient Goals and CMS Choice Patient states their goals for this hospitalization and ongoing recovery are:: return home          Expected Discharge Plan and Services In-house Referral: Clinical Social Work     Living arrangements for the past 2 months: Single Family Home                           HH Arranged: PT HH Agency: [facility] Date HH Agency Contacted: 10/19/22 Time HH Agency Contacted: 1447 Representative spoke with at [facility] Agency: Morrie Sheldon  Prior Living Arrangements/Services Living arrangements for the past 2 months: Single Family Home Lives with:: Spouse Patient language and need for interpreter reviewed:: Yes Do you feel safe going back to the place where you live?: Yes      Need for Family Participation in Patient Care: Yes (Comment) Care giver support system in place?: Yes (comment)   Criminal Activity/Legal Involvement Pertinent to Current Situation/Hospitalization: No - Comment as needed  Activities of Daily Living Home Assistive Devices/Equipment: Wheelchair ADL Screening (condition at time of admission) Patient's cognitive ability adequate to safely  complete daily activities?: Yes Is the patient deaf or have difficulty hearing?: No Does the patient have difficulty seeing, even when wearing glasses/contacts?: No Does the patient have difficulty concentrating, remembering, or making decisions?: No Patient able to express need for assistance with ADLs?: Yes Does the patient have difficulty dressing or bathing?: Yes Independently performs ADLs?: No Communication: Independent Dressing (OT): Needs assistance Is this a change from baseline?: Pre-admission baseline Feeding: Needs assistance Is this a change from baseline?: Pre-admission baseline Bathing: Needs assistance Is this a change from baseline?: Pre-admission baseline Toileting: Needs assistance Is this a change from baseline?: Pre-admission baseline In/Out Bed: Needs assistance Is this a change from baseline?: Pre-admission baseline Walks in Home: Dependent Is this a change from baseline?: Pre-admission baseline Does the patient have difficulty walking or climbing stairs?: Yes Weakness of Legs: Both Weakness of Arms/Hands: Both  Permission Sought/Granted Permission sought to share information with : Family Supports Permission granted to share information with : Yes, Verbal Permission Granted  Share Information with NAME: wife, Dillen Grayer @ [phone]           Emotional Assessment Appearance:: Appears stated age Attitude/Demeanor/Rapport: Gracious Affect (typically observed): Accepting Orientation: : Oriented to Self, Oriented to Place, Oriented to  Time, Oriented to Situation Alcohol / Substance Use: Not Applicable Psych Involvement: No (comment)  Admission diagnosis:  Lower urinary tract infectious  disease [N39.0] Sepsis secondary to UTI (HCC) [A41.9, N39.0] Patient Active Problem List   Diagnosis Date Noted   Sepsis secondary to UTI (HCC) 10/17/2022   Lactic acidosis    Hypotension 05/15/2019   Tachycardia 05/15/2019   History of bladder surgery  05/15/2019   Leukopenia 05/15/2019   Hypovolemia 05/15/2019   Gross hematuria 05/15/2019   Gout of multiple sites 12/22/2018   Macrocytosis 12/21/2018   COVID-19 virus infection 12/21/2018   Severe sepsis (HCC) 06/04/2016   Bacteremia due to Gram-positive bacteria 06/04/2016   Acute pyelonephritis 06/02/2016   Acute lower UTI 06/01/2016   Hyperlipemia 06/01/2016   GERD (gastroesophageal reflux disease) 06/01/2016   Nausea and vomiting 06/01/2016   Nephrotic syndrome 12/29/2013   Purpura (HCC) 12/29/2013   Hypokalemia 12/29/2013   Hyponatremia 12/29/2013   Thrombocytopenia (HCC) 12/29/2013   Essential hypertension 12/29/2013   Elevated LFTs 12/29/2013   PCP:  [facility] Pharmacy:   [facility] - [place], [place] - [address] [place] [address] Phone: [phone] Fax: [phone]  [facility] - [place], [place] - [address] [place] [address] Phone: [phone] Fax: [phone]     Social Determinants of Health (SDOH) Social History: SDOH Screenings   Food Insecurity: Patient Declined (10/17/2022)  Housing: High Risk (10/17/2022)  Transportation Needs: Patient Declined (10/17/2022)  Utilities: Patient Declined (10/17/2022)  Financial Resource Strain: Low Risk  (05/05/2022)   Received from [facility], [facility]  Social Connections: Unknown (04/29/2022)   Received from [facility], [facility]  Stress: No Stress Concern Present (04/29/2022)   Received from [facility], [facility]  Tobacco Use: Low Risk  (10/17/2022)   SDOH Interventions:     Readmission Risk Interventions    10/19/2022    2:45 PM  Readmission Risk Prevention Plan  Post Dischage Appt Complete  Medication Screening Complete  Transportation Screening Complete

## 2022-10-19 NOTE — Plan of Care (Signed)
  Problem: Education: Goal: Knowledge of General Education information will improve Description: Including pain rating scale, medication(s)/side effects and non-pharmacologic comfort measures Outcome: Progressing   Problem: Health Behavior/Discharge Planning: Goal: Ability to manage health-related needs will improve Outcome: Progressing   Problem: Clinical Measurements: Goal: Ability to maintain clinical measurements within normal limits will improve Outcome: Progressing Goal: Will remain free from infection Outcome: Progressing Goal: Diagnostic test results will improve Outcome: Progressing Goal: Respiratory complications will improve Outcome: Progressing Goal: Cardiovascular complication will be avoided Outcome: Progressing   Problem: Activity: Goal: Risk for activity intolerance will decrease Outcome: Progressing   Problem: Nutrition: Goal: Adequate nutrition will be maintained Outcome: Adequate for Discharge   Problem: Coping: Goal: Level of anxiety will decrease Outcome: Progressing   Problem: Elimination: Goal: Will not experience complications related to bowel motility Outcome: Completed/Met Goal: Will not experience complications related to urinary retention Outcome: Adequate for Discharge   Problem: Pain Managment: Goal: General experience of comfort will improve Outcome: Progressing   Problem: Safety: Goal: Ability to remain free from injury will improve Outcome: Progressing   Problem: Skin Integrity: Goal: Risk for impaired skin integrity will decrease Outcome: Progressing

## 2022-10-19 NOTE — Plan of Care (Signed)
  Problem: Clinical Measurements: Goal: Ability to avoid or minimize complications of infection will improve Outcome: Completed/Met   Problem: Skin Integrity: Goal: Skin integrity will improve Outcome: Completed/Met Cellulitis care plan was attached in error 8/6

## 2022-10-19 NOTE — Progress Notes (Addendum)
PROGRESS NOTE    [name]  WGN:[id number] DOB: [birth year] DOA: 10/17/2022 PCP: [facility]    Brief Narrative:   Steven Bradley is a 77 y.o. male with past medical history significant for EtOH use disorder, EtOH cirrhosis, HTN, HLD, prostate cancer, epilepsy who presented to [facility] ED on 8/4 from home via EMS for weakness, fever.  Onset 3 days prior to admission.  He reports feeling tired, increased frequency of urination and mild dysuria.  Reports fevers as high as 103.0 F.  Denied abdominal pain, no nausea/vomiting, no cough, no shortness of breath.  Given his persistent symptoms he sought further care in the ED.  In the ED, temperature 102.0 F, HR 105, RR 24, BP 130/85, SpO2 98% on room air.  WBC 10.6, hemoglobin 13.0, platelets 119.  Sodium 133, potassium 4.0, chloride 99, CO2 20, glucose 117, BUN 15, creatinine 0.72.  AST 147, ALT 85, total bilirubin 4.4.  Lactic acid 2.0.  COVID/influenza/RSV PCR negative.  Urinalysis with small leukocytes, positive nitrite, rare bacteria, greater than 50 WBCs. Right upper quadrant ultrasound with increased hepatic parenchymal echogenicity and coarsened echotexture likely reflecting hepatic steatosis and/or cirrhosis, no biliary dilation.  Blood cultures x 2 and urine culture obtained.  Patient was started on empiric antibiotics and IV fluids.  TRH consulted for admission for further evaluation and management of severe sepsis secondary to UTI.  Assessment & Plan:   Severe sepsis, POA Urine tract infection Patient presenting to ED with progressive fatigue, increased urinary frequency/dysuria and fever.  Temperature 102.0 3 Fahrenheit, tachycardic, tachypneic with elevated total bilirubin and lactic acid.  Urinalysis consistent with urinary tract infection. -- WBC 10.6>9.0>8.1 -- Lactic acid 2.0>0.6 -- Blood cultures x 2: no growth < 24h -- Urine culture: Multiple species -- Ceftriaxone 1g IV q24h -- NS at 100 mL/h --  Repeat CBC in a.m.  Hypovolemic hyponatremia Sodium 133 on admission, likely secondary to poor oral intake/dehydration in days preceding hospitalization. -- Na 133>132>135 -- NS at 100 mL/h -- BMP daily  Hypokalemia Hypomagnesemia Potassium 3.4, magnesium 1.4, will replete. -- Repeat electrolytes in the a.m.  Questionable gout flare Patient reports pain to his feet, although uric acid 5.5. -- Colchicine 0.6 mg p.o. daily  Essential hypertension -- Amlodipine 5 mg p.o. daily  Hyperlipidemia -- Atorvastatin 40 mg p.o. daily  Epilepsy -- Keppra 1000 mg p.o. twice daily  Depression -- Zoloft 100 mg p.o. daily  GERD -- Protonix 40 mg p.o. daily  Weakness/debility/deconditioning: -- PT/OT recommendation of home health on discharge, home health orders placed.  TOC consulted.   DVT prophylaxis: enoxaparin (LOVENOX) injection 40 mg Start: 10/17/22 2200 SCDs Start: 10/17/22 1520    Code Status: Full Code Family Communication: Updated patient's spouse [name] via telephone this morning  Disposition Plan:  Level of care: Med-Surg Status is: Inpatient Remains inpatient appropriate because: IV antibiotics, awaiting urine culture identification/susceptibilities, PT evaluation, anticipate discharge home in likely 1-2 days  Consultants:  None  Procedures:  None  Antimicrobials:  Ceftriaxone 8/4>   Subjective: Patient seen examined bedside, resting calmly.  Eating breakfast.  No specific complaints this morning.  No family present at bedside, updated spouse [name] via telephone this morning.  Overall feels much improved.  Continues on IV antibiotics.  No specific concerns or questions tomorrow.  Discussed anticipated discharge home tomorrow.  Denies headache, no dizziness, no chest pain, no palpitations, no shortness of breath, no abdominal pain, no current fever, no chills/night sweats, no nausea/vomiting/diarrhea,  no focal weakness, no fatigue, no paresthesias.  No acute  events overnight per nurse staff.  Objective: Vitals:   10/18/22 0532 10/18/22 1413 10/18/22 2159 10/19/22 0444  BP: 125/79 (!) 154/86 (!) 135/90 131/88  Pulse: 80 96 80 95  Resp: 16 16 17 17   Temp: 98.2 F (36.8 C) 98.3 F (36.8 C) 97.8 F (36.6 C) 98.2 F (36.8 C)  TempSrc: Oral Oral Oral Oral  SpO2: 100% 99% 100% 100%  Weight:      Height:        Intake/Output Summary (Last 24 hours) at 10/19/2022 1224 Last data filed at 10/19/2022 1119 Gross per 24 hour  Intake 2399.78 ml  Output 600 ml  Net 1799.78 ml   Filed Weights   10/17/22 1150  Weight: 87.1 kg    Examination:  Physical Exam: GEN: NAD, alert and oriented x 3, elderly in appearance HEENT: NCAT, PERRL, EOMI, sclera clear, MMM PULM: CTAB w/o wheezes/crackles, normal respiratory effort, on room air CV: RRR w/o M/G/R GI: abd soft, NTND, NABS, no R/G/M MSK: no peripheral edema, muscle strength globally intact 5/5 bilateral upper/lower extremities NEURO: CN II-XII intact, no focal deficits, sensation to light touch intact PSYCH: normal mood/affect Integumentary: dry/intact, no rashes or wounds    Data Reviewed: I have personally reviewed following labs and imaging studies  CBC: Recent Labs  Lab 10/17/22 1225 10/18/22 0314 10/19/22 0323  WBC 10.6* 9.0 8.1  NEUTROABS 7.5  --   --   HGB 13.0 11.2* 10.3*  HCT 38.6* 34.8* 31.0*  MCV 101.0* 107.4* 102.0*  PLT 119* 129* 170   Basic Metabolic Panel: Recent Labs  Lab 10/17/22 1336 10/18/22 0314 10/19/22 0323  NA 133* 132* 135  K 4.0 3.6 3.4*  CL 99 100 106  CO2 20* 22 22  GLUCOSE 117* 102* 110*  BUN 15 19 18   CREATININE 0.72 1.01 0.79  CALCIUM 8.8* 8.5* 8.5*  MG  --   --  1.4*   GFR: Estimated Creatinine Clearance: 98.3 mL/min (by C-G formula based on SCr of 0.79 mg/dL). Liver Function Tests: Recent Labs  Lab 10/17/22 1336 10/18/22 0314  AST 147* 93*  ALT 85* 67*  ALKPHOS 192* 165*  BILITOT 4.4* 3.2*  PROT 7.7 7.0  ALBUMIN 3.0* 2.7*    No results for input(s): "LIPASE", "AMYLASE" in the last 168 hours. No results for input(s): "AMMONIA" in the last 168 hours. Coagulation Profile: No results for input(s): "INR", "PROTIME" in the last 168 hours. Cardiac Enzymes: No results for input(s): "CKTOTAL", "CKMB", "CKMBINDEX", "TROPONINI" in the last 168 hours. BNP (last 3 results) No results for input(s): "PROBNP" in the last 8760 hours. HbA1C: No results for input(s): "HGBA1C" in the last 72 hours. CBG: No results for input(s): "GLUCAP" in the last 168 hours. Lipid Profile: No results for input(s): "CHOL", "HDL", "LDLCALC", "TRIG", "CHOLHDL", "LDLDIRECT" in the last 72 hours. Thyroid Function Tests: No results for input(s): "TSH", "T4TOTAL", "FREET4", "T3FREE", "THYROIDAB" in the last 72 hours. Anemia Panel: No results for input(s): "VITAMINB12", "FOLATE", "FERRITIN", "TIBC", "IRON", "RETICCTPCT" in the last 72 hours. Sepsis Labs: Recent Labs  Lab 10/17/22 1235 10/17/22 1430  LATICACIDVEN 2.0* 0.6    Recent Results (from the past 240 hour(s))  Culture, blood (Routine X 2) w Reflex to ID Panel     Status: None (Preliminary result)   Collection Time: 10/17/22 12:20 PM   Specimen: BLOOD  Result Value Ref Range Status   Specimen Description   Final    BLOOD  LEFT ANTECUBITAL Performed at [facility], [address], [place], [place] [address]    Special Requests   Final    BOTTLES DRAWN AEROBIC AND ANAEROBIC Blood Culture results may not be optimal due to an inadequate volume of blood received in culture bottles Performed at [facility], [address], [place], [place] [address]    Culture   Final    NO GROWTH 2 DAYS Performed at [facility], [address] [place], [place] [address]    Report Status PENDING  Incomplete  Culture, blood (Routine X 2) w Reflex to ID Panel     Status: None (Preliminary result)   Collection Time: 10/17/22 12:25 PM   Specimen: BLOOD   Result Value Ref Range Status   Specimen Description   Final    BLOOD BLOOD LEFT FOREARM Performed at [facility], [address], [place], [place] [address]    Special Requests   Final    BOTTLES DRAWN AEROBIC AND ANAEROBIC Blood Culture results may not be optimal due to an inadequate volume of blood received in culture bottles Performed at [facility], [address], [place], [place] [address]    Culture   Final    NO GROWTH 2 DAYS Performed at [facility], [address], [place], [place] [address]    Report Status PENDING  Incomplete  Resp panel by RT-PCR (RSV, Flu A&B, Covid) Anterior Nasal Swab     Status: None   Collection Time: 10/17/22 12:37 PM   Specimen: Anterior Nasal Swab  Result Value Ref Range Status   SARS Coronavirus 2 by RT PCR NEGATIVE NEGATIVE Final    Comment: (NOTE) SARS-CoV-2 target nucleic acids are NOT DETECTED.  The SARS-CoV-2 RNA is generally detectable in upper respiratory specimens during the acute phase of infection. The lowest concentration of SARS-CoV-2 viral copies this assay can detect is 138 copies/mL. A negative result does not preclude SARS-Cov-2 infection and should not be used as the sole basis for treatment or other patient management decisions. A negative result may occur with  improper specimen collection/handling, submission of specimen other than nasopharyngeal swab, presence of viral mutation(s) within the areas targeted by this assay, and inadequate number of viral copies(<138 copies/mL). A negative result must be combined with clinical observations, patient history, and epidemiological information. The expected result is Negative.  Fact Sheet for Patients:  BloggerCourse.com  Fact Sheet for Healthcare Providers:  SeriousBroker.it  This test is no t yet approved or cleared by the [place] FDA and  has been authorized for  detection and/or diagnosis of SARS-CoV-2 by FDA under an Emergency Use Authorization (EUA). This EUA will remain  in effect (meaning this test can be used) for the duration of the COVID-19 declaration under Section 564(b)(1) of the Act, 21 U.S.C.section 360bbb-3(b)(1), unless the authorization is terminated  or revoked sooner.       Influenza A by PCR NEGATIVE NEGATIVE Final   Influenza B by PCR NEGATIVE NEGATIVE Final    Comment: (NOTE) The Xpert Xpress SARS-CoV-2/FLU/RSV plus assay is intended as an aid in the diagnosis of influenza from Nasopharyngeal swab specimens and should not be used as a sole basis for treatment. Nasal washings and aspirates are unacceptable for Xpert Xpress SARS-CoV-2/FLU/RSV testing.  Fact Sheet for Patients: BloggerCourse.com  Fact Sheet for Healthcare Providers: SeriousBroker.it  This test is not yet approved or cleared by the [place] and has been authorized  for detection and/or diagnosis of SARS-CoV-2 by FDA under an Emergency Use Authorization (EUA). This EUA will remain in effect (meaning this test can be used) for the duration of the COVID-19 declaration under Section 564(b)(1) of the Act, 21 U.S.C. section 360bbb-3(b)(1), unless the authorization is terminated or revoked.     Resp Syncytial Virus by PCR NEGATIVE NEGATIVE Final    Comment: (NOTE) Fact Sheet for Patients: BloggerCourse.com  Fact Sheet for Healthcare Providers: SeriousBroker.it  This test is not yet approved or cleared by the [place] FDA and has been authorized for detection and/or diagnosis of SARS-CoV-2 by FDA under an Emergency Use Authorization (EUA). This EUA will remain in effect (meaning this test can be used) for the duration of the COVID-19 declaration under Section 564(b)(1) of the Act, 21 U.S.C. section 360bbb-3(b)(1), unless the authorization is  terminated or revoked.  Performed at [facility], [address] [place], [place] [address]   Urine Culture (for pregnant, neutropenic or urologic patients or patients with an indwelling urinary catheter)     Status: Abnormal   Collection Time: 10/17/22  3:34 PM   Specimen: Urine, Clean Catch  Result Value Ref Range Status   Specimen Description   Final    URINE, CLEAN CATCH Performed at [facility], [address], [place], [place] [address]    Special Requests   Final    NONE Performed at [facility], [address], [place], [place] [address]    Culture MULTIPLE SPECIES PRESENT, SUGGEST RECOLLECTION (A)  Final   Report Status 10/18/2022 FINAL  Final         Radiology Studies: US Abdomen Limited RUQ (LIVER/GB)  Result Date: 10/17/2022 CLINICAL DATA:  Elevated liver function tests. EXAM: ULTRASOUND ABDOMEN LIMITED RIGHT UPPER QUADRANT COMPARISON:  CT abdomen pelvis dated 06/09/2016 and abdominal ultrasound dated 12/30/2013. FINDINGS: Gallbladder: No gallstones or wall thickening visualized. No sonographic Murphy sign noted by sonographer. Common bile duct: Diameter: 4 mm Liver: No focal lesion identified. Increased parenchymal echogenicity and coarsened echotexture. Portal vein is patent on color Doppler imaging with normal direction of blood flow towards the liver. Other: None. IMPRESSION: Increased hepatic parenchymal echogenicity and coarsened echotexture likely reflects hepatic steatosis and/or cirrhosis. No biliary dilatation. Electronically Signed   By: Romona Curls M.D.   On: 10/17/2022 16:24        Scheduled Meds:  amLODipine  5 mg Oral Daily   atorvastatin  40 mg Oral QHS   colchicine  0.6 mg Oral Daily   enoxaparin (LOVENOX) injection  40 mg Subcutaneous Q24H   gabapentin  100 mg Oral TID   levETIRAcetam  1,000 mg Oral BID   pantoprazole  40 mg Oral Daily   potassium chloride  30 mEq Oral Q3H    sertraline  100 mg Oral Daily   traZODone  100 mg Oral QHS   Continuous Infusions:  sodium chloride 100 mL/hr at 10/18/22 2201   cefTRIAXone (ROCEPHIN)  IV 1 g (10/19/22 1012)     LOS: 2 days    Time spent: 52 minutes spent on chart review, discussion with nursing staff, consultants, updating family and interview/physical exam; more than 50% of that time was spent in counseling and/or coordination of care.    Alvira Philips [place], DO [facility] Available via Epic secure chat 7am-7pm After these hours, please refer to coverage provider listed on [email or web address] 10/19/2022, 12:24 PM

## 2022-10-19 NOTE — Plan of Care (Signed)
  Problem: Education: Goal: Knowledge of General Education information will improve Description Including pain rating scale, medication(s)/side effects and non-pharmacologic comfort measures Outcome: Progressing   

## 2022-10-20 ENCOUNTER — Other Ambulatory Visit (HOSPITAL_COMMUNITY): Payer: Self-pay

## 2022-10-20 DIAGNOSIS — A419 Sepsis, unspecified organism: Secondary | ICD-10-CM | POA: Diagnosis not present

## 2022-10-20 DIAGNOSIS — N390 Urinary tract infection, site not specified: Secondary | ICD-10-CM | POA: Diagnosis not present

## 2022-10-20 LAB — CBC
HCT: 33 % — ABNORMAL LOW (ref 39.0–52.0)
Hemoglobin: 10.8 g/dL — ABNORMAL LOW (ref 13.0–17.0)
MCH: 33.3 pg (ref 26.0–34.0)
MCHC: 32.7 g/dL (ref 30.0–36.0)
MCV: 101.9 fL — ABNORMAL HIGH (ref 80.0–100.0)
Platelets: 207 K/uL (ref 150–400)
RBC: 3.24 MIL/uL — ABNORMAL LOW (ref 4.22–5.81)
RDW: 12.5 % (ref 11.5–15.5)
WBC: 6 K/uL (ref 4.0–10.5)
nRBC: 0 % (ref 0.0–0.2)

## 2022-10-20 LAB — BASIC METABOLIC PANEL WITH GFR
Anion gap: 8 (ref 5–15)
BUN: 11 mg/dL (ref 8–23)
CO2: 22 mmol/L (ref 22–32)
Calcium: 8.9 mg/dL (ref 8.9–10.3)
Chloride: 105 mmol/L (ref 98–111)
Creatinine, Ser: 0.73 mg/dL (ref 0.61–1.24)
GFR, Estimated: >60 mL/min
Glucose, Bld: 94 mg/dL (ref 70–99)
Potassium: 4 mmol/L (ref 3.5–5.1)
Sodium: 135 mmol/L (ref 135–145)

## 2022-10-20 MED ORDER — CEPHALEXIN 500 MG PO CAPS: 500.0000 mg | ORAL_CAPSULE | Freq: Three times a day (TID) | ORAL | Status: DC

## 2022-10-20 MED ORDER — CEPHALEXIN 500 MG PO CAPS
500.0000 mg | ORAL_CAPSULE | Freq: Three times a day (TID) | ORAL | 0 refills | Status: AC
  Filled 2022-10-20: qty 9, 3d supply, fill #0

## 2022-10-20 MED ORDER — CEPHALEXIN 500 MG PO CAPS: 500.0000 mg | ORAL_CAPSULE | Freq: Three times a day (TID) | ORAL | 0 refills | Status: DC

## 2022-10-20 NOTE — Plan of Care (Signed)
Discharge instructions given to the patient including medications and follow up.  

## 2022-10-20 NOTE — Discharge Summary (Incomplete)
Physician Discharge Summary   Patient: Steven Bradley MRN: [id number] DOB: Sep 10, 1945  Admit date:     10/17/2022  Discharge date: {dischdate:26783}  Discharge Physician: Alba Cory   PCP: [facility]   Recommendations at discharge:  {Tip this will not be part of the note when signed- Example include specific recommendations for outpatient follow-up, pending tests to follow-up on. (Optional):26781}  ***  Discharge Diagnoses: Principal Problem:   Sepsis secondary to UTI [facility])  Resolved Problems:   * No resolved hospital problems. [facility] Course: No notes on file  Assessment and Plan: No notes have been filed under this hospital service. Service: Hospitalist     {Tip this will not be part of the note when signed Body mass index is 22.19 kg/m. , ,  (Optional):26781}  {(NOTE) Pain control PDMP Statment (Optional):26782} Consultants: *** Procedures performed: ***  Disposition: {Plan; Disposition:26390} Diet recommendation:  Discharge Diet Orders (From admission, onward)     Start     Ordered   10/20/22 0000  Diet - low sodium heart healthy        10/20/22 1100           {Diet_Plan:26776} DISCHARGE MEDICATION: Allergies as of 10/20/2022       Reactions   Viagra [sildenafil] Nausea And Vomiting   Ciprofloxacin Rash   Spironolactone Rash   Vitamin D Analogs Rash        Medication List     STOP taking these medications    atorvastatin 80 MG tablet Commonly known as: LIPITOR       TAKE these medications    allopurinol 100 MG tablet Commonly known as: ZYLOPRIM Take 100 mg by mouth daily.   amLODipine 5 MG tablet Commonly known as: NORVASC Take 5 mg by mouth daily.   capsaicin 0.025 % cream Commonly known as: ZOSTRIX Apply 1 Application topically 2 (two) times daily as needed (skin).   cephALEXin 500 MG capsule Commonly known as: KEFLEX Take 1 capsule (500 mg total) by mouth every 8 (eight) hours for 3 days.    colchicine 0.6 MG tablet Take 0.6 mg by mouth daily as needed (gout).   diclofenac Sodium 1 % Gel Commonly known as: VOLTAREN Apply 2 g topically 3 (three) times daily as needed (Neck and hand pain).   furosemide 40 MG tablet Commonly known as: LASIX Take 40 mg by mouth daily.   gabapentin 100 MG capsule Commonly known as: NEURONTIN Take 100 mg by mouth 3 (three) times daily.   levETIRAcetam 1000 MG tablet Commonly known as: KEPPRA Take 1,000 mg by mouth 2 (two) times daily.   loratadine 10 MG tablet Commonly known as: CLARITIN Take 10 mg by mouth daily as needed for allergies.   magnesium oxide 400 (241.3 Mg) MG tablet Commonly known as: MAG-OX Take 400 mg by mouth daily.   multivitamin with minerals Tabs tablet Take 1 tablet by mouth daily.   naproxen 500 MG tablet Commonly known as: NAPROSYN Take 500 mg by mouth 2 (two) times daily with a meal.   omeprazole 20 MG capsule Commonly known as: PRILOSEC Take 2 capsules (40 mg total) by mouth 2 (two) times daily before a meal.   sertraline 100 MG tablet Commonly known as: ZOLOFT Take 100 mg by mouth daily.   tadalafil 5 MG tablet Commonly known as: CIALIS Take 5 mg by mouth daily as needed for erectile dysfunction.   Tetrahydroz-Dextran-PEG-Povid 0.05-0.1-1-1 % Soln Place 1 drop into both eyes daily.  traZODone 100 MG tablet Commonly known as: DESYREL Take 100 mg by mouth at bedtime.        Follow-up Information     [facility] in 1 week(s).   Why: you need Liver function check in 1 week. Contact information: [address] [facility] [place] [address] [phone]                Discharge Exam: Ceasar Mons Weights   10/17/22 1150  Weight: 87.1 kg   ***  Condition at discharge: {DC Condition:26389}  The results of significant diagnostics from this hospitalization (including imaging, microbiology, ancillary and laboratory) are listed below for reference.    Imaging Studies: US Abdomen Limited RUQ (LIVER/GB)  Result Date: 10/17/2022 CLINICAL DATA:  Elevated liver function tests. EXAM: ULTRASOUND ABDOMEN LIMITED RIGHT UPPER QUADRANT COMPARISON:  CT abdomen pelvis dated 06/09/2016 and abdominal ultrasound dated 12/30/2013. FINDINGS: Gallbladder: No gallstones or wall thickening visualized. No sonographic Murphy sign noted by sonographer. Common bile duct: Diameter: 4 mm Liver: No focal lesion identified. Increased parenchymal echogenicity and coarsened echotexture. Portal vein is patent on color Doppler imaging with normal direction of blood flow towards the liver. Other: None. IMPRESSION: Increased hepatic parenchymal echogenicity and coarsened echotexture likely reflects hepatic steatosis and/or cirrhosis. No biliary dilatation. Electronically Signed   By: Romona Curls M.D.   On: 10/17/2022 16:24    Microbiology: Results for orders placed or performed during the hospital encounter of 10/17/22  Culture, blood (Routine X 2) w Reflex to ID Panel     Status: None (Preliminary result)   Collection Time: 10/17/22 12:20 PM   Specimen: BLOOD  Result Value Ref Range Status   Specimen Description   Final    BLOOD LEFT ANTECUBITAL Performed at [facility], [address], [place], [place] [address]    Special Requests   Final    BOTTLES DRAWN AEROBIC AND ANAEROBIC Blood Culture results may not be optimal due to an inadequate volume of blood received in culture bottles Performed at [facility], [address], [place], [place] [address]    Culture   Final    NO GROWTH 3 DAYS Performed at [facility], [address], [place], [place] [address]    Report Status PENDING  Incomplete  Culture, blood (Routine X 2) w Reflex to ID Panel     Status: None (Preliminary result)   Collection Time: 10/17/22 12:25 PM   Specimen: BLOOD  Result Value Ref Range Status   Specimen Description   Final    BLOOD BLOOD LEFT  FOREARM Performed at [facility], [address] [place], [place] [address]    Special Requests   Final    BOTTLES DRAWN AEROBIC AND ANAEROBIC Blood Culture results may not be optimal due to an inadequate volume of blood received in culture bottles Performed at [facility], [address], [place], [place] [address]    Culture   Final    NO GROWTH 3 DAYS Performed at [facility], [address] [place], [place] [address]    Report Status PENDING  Incomplete  Resp panel by RT-PCR (RSV, Flu A&B, Covid) Anterior Nasal Swab     Status: None   Collection Time: 10/17/22 12:37 PM   Specimen: Anterior Nasal Swab  Result Value Ref Range Status   SARS Coronavirus 2 by RT PCR NEGATIVE NEGATIVE Final    Comment: (NOTE) SARS-CoV-2 target nucleic acids are NOT  DETECTED.  The SARS-CoV-2 RNA is generally detectable in upper respiratory specimens during the acute phase of infection. The lowest concentration of SARS-CoV-2 viral copies this assay can detect is 138 copies/mL. A negative result does not preclude SARS-Cov-2 infection and should not be used as the sole basis for treatment or other patient management decisions. A negative result may occur with  improper specimen collection/handling, submission of specimen other than nasopharyngeal swab, presence of viral mutation(s) within the areas targeted by this assay, and inadequate number of viral copies(<138 copies/mL). A negative result must be combined with clinical observations, patient history, and epidemiological information. The expected result is Negative.  Fact Sheet for Patients:  BloggerCourse.com  Fact Sheet for Healthcare Providers:  SeriousBroker.it  This test is no t yet approved or cleared by the [place] FDA and  has been authorized for detection and/or diagnosis of SARS-CoV-2 by FDA under an Emergency Use Authorization  (EUA). This EUA will remain  in effect (meaning this test can be used) for the duration of the COVID-19 declaration under Section 564(b)(1) of the Act, 21 U.S.C.section 360bbb-3(b)(1), unless the authorization is terminated  or revoked sooner.       Influenza A by PCR NEGATIVE NEGATIVE Final   Influenza B by PCR NEGATIVE NEGATIVE Final    Comment: (NOTE) The Xpert Xpress SARS-CoV-2/FLU/RSV plus assay is intended as an aid in the diagnosis of influenza from Nasopharyngeal swab specimens and should not be used as a sole basis for treatment. Nasal washings and aspirates are unacceptable for Xpert Xpress SARS-CoV-2/FLU/RSV testing.  Fact Sheet for Patients: BloggerCourse.com  Fact Sheet for Healthcare Providers: SeriousBroker.it  This test is not yet approved or cleared by the [place] FDA and has been authorized for detection and/or diagnosis of SARS-CoV-2 by FDA under an Emergency Use Authorization (EUA). This EUA will remain in effect (meaning this test can be used) for the duration of the COVID-19 declaration under Section 564(b)(1) of the Act, 21 U.S.C. section 360bbb-3(b)(1), unless the authorization is terminated or revoked.     Resp Syncytial Virus by PCR NEGATIVE NEGATIVE Final    Comment: (NOTE) Fact Sheet for Patients: BloggerCourse.com  Fact Sheet for Healthcare Providers: SeriousBroker.it  This test is not yet approved or cleared by the [place] FDA and has been authorized for detection and/or diagnosis of SARS-CoV-2 by FDA under an Emergency Use Authorization (EUA). This EUA will remain in effect (meaning this test can be used) for the duration of the COVID-19 declaration under Section 564(b)(1) of the Act, 21 U.S.C. section 360bbb-3(b)(1), unless the authorization is terminated or revoked.  Performed at [facility], [address] [place], [place] [address]   Urine Culture (for pregnant, neutropenic or urologic patients or patients with an indwelling urinary catheter)     Status: Abnormal   Collection Time: 10/17/22  3:34 PM   Specimen: Urine, Clean Catch  Result Value Ref Range Status   Specimen Description   Final    URINE, CLEAN CATCH Performed at [facility], [address], [place], [place] [address]    Special Requests   Final    NONE Performed at [facility], [address] [place], [place] [address]    Culture MULTIPLE SPECIES PRESENT, SUGGEST RECOLLECTION (A)  Final   Report Status 10/18/2022 FINAL  Final    Labs: CBC: Recent Labs  Lab 10/17/22 1225 10/18/22 0314 10/19/22 0323 10/20/22 0327  WBC 10.6* 9.0 8.1 6.0  NEUTROABS 7.5  --   --   --  HGB 13.0 11.2* 10.3* 10.8*  HCT 38.6* 34.8* 31.0* 33.0*  MCV 101.0* 107.4* 102.0* 101.9*  PLT 119* 129* 170 207   Basic Metabolic Panel: Recent Labs  Lab 10/17/22 1336 10/18/22 0314 10/19/22 0323 10/20/22 0327  NA 133* 132* 135 135  K 4.0 3.6 3.4* 4.0  CL 99 100 106 105  CO2 20* 22 22 22   GLUCOSE 117* 102* 110* 94  BUN 15 19 18 11   CREATININE 0.72 1.01 0.79 0.73  CALCIUM 8.8* 8.5* 8.5* 8.9  MG  --   --  1.4* 1.9   Liver Function Tests: Recent Labs  Lab 10/17/22 1336 10/18/22 0314  AST 147* 93*  ALT 85* 67*  ALKPHOS 192* 165*  BILITOT 4.4* 3.2*  PROT 7.7 7.0  ALBUMIN 3.0* 2.7*   CBG: No results for input(s): "GLUCAP" in the last 168 hours.  Discharge time spent: {LESS THAN/GREATER SWFU:93235} 30 minutes.  Signed: Alba Cory, MD [facility] 10/20/2022

## 2022-10-20 NOTE — Plan of Care (Signed)
  Problem: Education: Goal: Knowledge of General Education information will improve Description: Including pain rating scale, medication(s)/side effects and non-pharmacologic comfort measures 10/20/2022 0000 by Brett Albino, RN Outcome: Progressing 10/19/2022 2234 by Brett Albino, RN Outcome: Progressing   Problem: Nutrition: Goal: Adequate nutrition will be maintained Outcome: Progressing   Problem: Pain Managment: Goal: General experience of comfort will improve Outcome: Progressing

## 2022-10-20 NOTE — Discharge Summary (Signed)
Physician Discharge Summary   Patient: Steven Bradley MRN: [id number] DOB: [birth year]  Admit date:     10/17/2022  Discharge date: 10/20/22  Discharge Physician: Alba Cory   PCP: [facility]   Recommendations at discharge:   Follow up resolution of UTI.  Discharge Diagnoses: Principal Problem:   Sepsis secondary to UTI [facility])  Resolved Problems:   * No resolved hospital problems. *  Hospital Course: Steven Bradley is a 77 y.o. male with past medical history significant for EtOH use disorder, EtOH cirrhosis, HTN, HLD, prostate cancer, epilepsy who presented to [facility] ED on 8/4 from home via EMS for weakness, fever.  Onset 3 days prior to admission.  He reports feeling tired, increased frequency of urination and mild dysuria.  Reports fevers as high as 103.0 F.  Denied abdominal pain, no nausea/vomiting, no cough, no shortness of breath.  Given his persistent symptoms he sought further care in the ED.   In the ED, temperature 102.0 F, HR 105, RR 24, BP 130/85, SpO2 98% on room air.  WBC 10.6, hemoglobin 13.0, platelets 119.  Sodium 133, potassium 4.0, chloride 99, CO2 20, glucose 117, BUN 15, creatinine 0.72.  AST 147, ALT 85, total bilirubin 4.4.  Lactic acid 2.0.  COVID/influenza/RSV PCR negative.  Urinalysis with small leukocytes, positive nitrite, rare bacteria, greater than 50 WBCs. Right upper quadrant ultrasound with increased hepatic parenchymal echogenicity and coarsened echotexture likely reflecting hepatic steatosis and/or cirrhosis, no biliary dilation.  Blood cultures x 2 and urine culture obtained.  Patient was started on empiric antibiotics and IV fluids.  TRH consulted for admission for further evaluation and management of severe sepsis secondary to UTI.    Assessment and Plan: Severe sepsis, POA Urine tract infection Patient presenting to ED with progressive fatigue, increased urinary frequency/dysuria and fever.  Temperature 102.0 3  Fahrenheit, tachycardic, tachypneic with elevated total bilirubin and lactic acid.  Urinalysis consistent with urinary tract infection. -- WBC 10.6>9.0>8.1 -- Lactic acid 2.0>0.6 -- Blood cultures x 2: no growth < 24h -- Urine culture: Multiple species -- Ceftriaxone 1g IV q24h WBC normal. Afebrile. Plan to discharge on Keflex for 3 days. He has received 3 days IV antibiotics.    Hypovolemic hyponatremia Sodium 133 on admission, likely secondary to poor oral intake/dehydration in days preceding hospitalization. -- Na 133>132>135 -- NS at 100 mL/h Resolved.    Hypokalemia Hypomagnesemia Potassium 3.4, magnesium 1.4, will replete. -- Replaced.    Questionable gout flare Patient reports pain to his feet, although uric acid 5.5. -- Colchicine 0.6 mg p.o. daily   Essential hypertension -- Amlodipine 5 mg p.o. daily   Hyperlipidemia -- Atorvastatin 40 mg p.o. daily   Epilepsy -- Keppra 1000 mg p.o. twice daily   Depression -- Zoloft 100 mg p.o. daily   GERD -- Protonix 40 mg p.o. daily   Weakness/debility/deconditioning: -- PT/OT recommendation of home health on discharge, home health orders placed.  TOC consulted.          Consultants: none Procedures performed: none Disposition: Home Diet recommendation:  Discharge Diet Orders (From admission, onward)     Start     Ordered   10/20/22 0000  Diet - low sodium heart healthy        10/20/22 1100           Cardiac diet DISCHARGE MEDICATION: Allergies as of 10/20/2022       Reactions   Viagra [sildenafil] Nausea And Vomiting   Ciprofloxacin  Rash   Spironolactone Rash   Vitamin D Analogs Rash        Medication List     STOP taking these medications    atorvastatin 80 MG tablet Commonly known as: LIPITOR       TAKE these medications    allopurinol 100 MG tablet Commonly known as: ZYLOPRIM Take 100 mg by mouth daily.   amLODipine 5 MG tablet Commonly known as: NORVASC Take 5 mg by mouth  daily.   capsaicin 0.025 % cream Commonly known as: ZOSTRIX Apply 1 Application topically 2 (two) times daily as needed (skin).   cephALEXin 500 MG capsule Commonly known as: KEFLEX Take 1 capsule (500 mg total) by mouth every 8 (eight) hours for 3 days.   colchicine 0.6 MG tablet Take 0.6 mg by mouth daily as needed (gout).   diclofenac Sodium 1 % Gel Commonly known as: VOLTAREN Apply 2 g topically 3 (three) times daily as needed (Neck and hand pain).   furosemide 40 MG tablet Commonly known as: LASIX Take 40 mg by mouth daily.   gabapentin 100 MG capsule Commonly known as: NEURONTIN Take 100 mg by mouth 3 (three) times daily.   levETIRAcetam 1000 MG tablet Commonly known as: KEPPRA Take 1,000 mg by mouth 2 (two) times daily.   loratadine 10 MG tablet Commonly known as: CLARITIN Take 10 mg by mouth daily as needed for allergies.   magnesium oxide 400 (241.3 Mg) MG tablet Commonly known as: MAG-OX Take 400 mg by mouth daily.   multivitamin with minerals Tabs tablet Take 1 tablet by mouth daily.   naproxen 500 MG tablet Commonly known as: NAPROSYN Take 500 mg by mouth 2 (two) times daily with a meal.   omeprazole 20 MG capsule Commonly known as: PRILOSEC Take 2 capsules (40 mg total) by mouth 2 (two) times daily before a meal.   sertraline 100 MG tablet Commonly known as: ZOLOFT Take 100 mg by mouth daily.   tadalafil 5 MG tablet Commonly known as: CIALIS Take 5 mg by mouth daily as needed for erectile dysfunction.   Tetrahydroz-Dextran-PEG-Povid 0.05-0.1-1-1 % Soln Place 1 drop into both eyes daily.   traZODone 100 MG tablet Commonly known as: DESYREL Take 100 mg by mouth at bedtime.        Follow-up Information     [facility] up in 1 week(s).   Why: you need Liver function check in 1 week. Contact information: [address] [facility] [address] [place] [address] [phone]                Discharge  Exam: Ceasar Mons Weights   10/17/22 1150  Weight: 87.1 kg   General; NAD  Condition at discharge: stable  The results of significant diagnostics from this hospitalization (including imaging, microbiology, ancillary and laboratory) are listed below for reference.   Imaging Studies: US Abdomen Limited RUQ (LIVER/GB)  Result Date: 10/17/2022 CLINICAL DATA:  Elevated liver function tests. EXAM: ULTRASOUND ABDOMEN LIMITED RIGHT UPPER QUADRANT COMPARISON:  CT abdomen pelvis dated 06/09/2016 and abdominal ultrasound dated 12/30/2013. FINDINGS: Gallbladder: No gallstones or wall thickening visualized. No sonographic Murphy sign noted by sonographer. Common bile duct: Diameter: 4 mm Liver: No focal lesion identified. Increased parenchymal echogenicity and coarsened echotexture. Portal vein is patent on color Doppler imaging with normal direction of blood flow towards the liver. Other: None. IMPRESSION: Increased hepatic parenchymal echogenicity and coarsened echotexture likely reflects hepatic steatosis and/or cirrhosis. No biliary dilatation. Electronically Signed   By: Joselyn Glassman  Litton M.D.   On: 10/17/2022 16:24    Microbiology: Results for orders placed or performed during the hospital encounter of 10/17/22  Culture, blood (Routine X 2) w Reflex to ID Panel     Status: None (Preliminary result)   Collection Time: 10/17/22 12:20 PM   Specimen: BLOOD  Result Value Ref Range Status   Specimen Description   Final    BLOOD LEFT ANTECUBITAL Performed at [facility], [address] [place], [place] [address]    Special Requests   Final    BOTTLES DRAWN AEROBIC AND ANAEROBIC Blood Culture results may not be optimal due to an inadequate volume of blood received in culture bottles Performed at [facility], [address], [place], [place] [address]    Culture   Final    NO GROWTH 3 DAYS Performed at [facility], [address], [place], [place] [address]     Report Status PENDING  Incomplete  Culture, blood (Routine X 2) w Reflex to ID Panel     Status: None (Preliminary result)   Collection Time: 10/17/22 12:25 PM   Specimen: BLOOD  Result Value Ref Range Status   Specimen Description   Final    BLOOD BLOOD LEFT FOREARM Performed at [facility], [address] [place], [place] [address]    Special Requests   Final    BOTTLES DRAWN AEROBIC AND ANAEROBIC Blood Culture results may not be optimal due to an inadequate volume of blood received in culture bottles Performed at [facility], [address] [place], [place] [address]    Culture   Final    NO GROWTH 3 DAYS Performed at [facility], [address] [place], [place] [address]    Report Status PENDING  Incomplete  Resp panel by RT-PCR (RSV, Flu A&B, Covid) Anterior Nasal Swab     Status: None   Collection Time: 10/17/22 12:37 PM   Specimen: Anterior Nasal Swab  Result Value Ref Range Status   SARS Coronavirus 2 by RT PCR NEGATIVE NEGATIVE Final    Comment: (NOTE) SARS-CoV-2 target nucleic acids are NOT DETECTED.  The SARS-CoV-2 RNA is generally detectable in upper respiratory specimens during the acute phase of infection. The lowest concentration of SARS-CoV-2 viral copies this assay can detect is 138 copies/mL. A negative result does not preclude SARS-Cov-2 infection and should not be used as the sole basis for treatment or other patient management decisions. A negative result may occur with  improper specimen collection/handling, submission of specimen other than nasopharyngeal swab, presence of viral mutation(s) within the areas targeted by this assay, and inadequate number of viral copies(<138 copies/mL). A negative result must be combined with clinical observations, patient history, and epidemiological information. The expected result is Negative.  Fact Sheet for Patients:  BloggerCourse.com  Fact  Sheet for Healthcare Providers:  SeriousBroker.it  This test is no t yet approved or cleared by the [place] FDA and  has been authorized for detection and/or diagnosis of SARS-CoV-2 by FDA under an Emergency Use Authorization (EUA). This EUA will remain  in effect (meaning this test can be used) for the duration of the COVID-19 declaration under Section 564(b)(1) of the Act, 21 U.S.C.section 360bbb-3(b)(1), unless the authorization is terminated  or revoked sooner.       Influenza A by PCR NEGATIVE NEGATIVE Final   Influenza B by PCR NEGATIVE NEGATIVE Final    Comment: (NOTE) The Xpert Xpress  SARS-CoV-2/FLU/RSV plus assay is intended as an aid in the diagnosis of influenza from Nasopharyngeal swab specimens and should not be used as a sole basis for treatment. Nasal washings and aspirates are unacceptable for Xpert Xpress SARS-CoV-2/FLU/RSV testing.  Fact Sheet for Patients: BloggerCourse.com  Fact Sheet for Healthcare Providers: SeriousBroker.it  This test is not yet approved or cleared by the [place] FDA and has been authorized for detection and/or diagnosis of SARS-CoV-2 by FDA under an Emergency Use Authorization (EUA). This EUA will remain in effect (meaning this test can be used) for the duration of the COVID-19 declaration under Section 564(b)(1) of the Act, 21 U.S.C. section 360bbb-3(b)(1), unless the authorization is terminated or revoked.     Resp Syncytial Virus by PCR NEGATIVE NEGATIVE Final    Comment: (NOTE) Fact Sheet for Patients: BloggerCourse.com  Fact Sheet for Healthcare Providers: SeriousBroker.it  This test is not yet approved or cleared by the [place] FDA and has been authorized for detection and/or diagnosis of SARS-CoV-2 by FDA under an Emergency Use Authorization (EUA). This EUA will remain in effect  (meaning this test can be used) for the duration of the COVID-19 declaration under Section 564(b)(1) of the Act, 21 U.S.C. section 360bbb-3(b)(1), unless the authorization is terminated or revoked.  Performed at [facility], [address] [place], [place] [address]   Urine Culture (for pregnant, neutropenic or urologic patients or patients with an indwelling urinary catheter)     Status: Abnormal   Collection Time: 10/17/22  3:34 PM   Specimen: Urine, Clean Catch  Result Value Ref Range Status   Specimen Description   Final    URINE, CLEAN CATCH Performed at [facility], [address] [place], [place] [address]    Special Requests   Final    NONE Performed at [facility], [address] [place], [place] [address]    Culture MULTIPLE SPECIES PRESENT, SUGGEST RECOLLECTION (A)  Final   Report Status 10/18/2022 FINAL  Final    Labs: CBC: Recent Labs  Lab 10/17/22 1225 10/18/22 0314 10/19/22 0323 10/20/22 0327  WBC 10.6* 9.0 8.1 6.0  NEUTROABS 7.5  --   --   --   HGB 13.0 11.2* 10.3* 10.8*  HCT 38.6* 34.8* 31.0* 33.0*  MCV 101.0* 107.4* 102.0* 101.9*  PLT 119* 129* 170 207   Basic Metabolic Panel: Recent Labs  Lab 10/17/22 1336 10/18/22 0314 10/19/22 0323 10/20/22 0327  NA 133* 132* 135 135  K 4.0 3.6 3.4* 4.0  CL 99 100 106 105  CO2 20* 22 22 22   GLUCOSE 117* 102* 110* 94  BUN 15 19 18 11   CREATININE 0.72 1.01 0.79 0.73  CALCIUM 8.8* 8.5* 8.5* 8.9  MG  --   --  1.4* 1.9   Liver Function Tests: Recent Labs  Lab 10/17/22 1336 10/18/22 0314  AST 147* 93*  ALT 85* 67*  ALKPHOS 192* 165*  BILITOT 4.4* 3.2*  PROT 7.7 7.0  ALBUMIN 3.0* 2.7*   CBG: No results for input(s): "GLUCAP" in the last 168 hours.  Discharge time spent: greater than 30 minutes.  Signed: Alba Cory, MD [facility] 10/20/2022

## 2022-10-20 NOTE — Plan of Care (Signed)
  Problem: Clinical Measurements: Goal: Ability to maintain clinical measurements within normal limits will improve Outcome: Progressing   Problem: Health Behavior/Discharge Planning: Goal: Ability to manage health-related needs will improve Outcome: Progressing   Problem: Coping: Goal: Level of anxiety will decrease Outcome: Progressing   Problem: Pain Managment: Goal: General experience of comfort will improve Outcome: Progressing   Problem: Safety: Goal: Ability to remain free from injury will improve Outcome: Progressing   Problem: Nutrition: Goal: Adequate nutrition will be maintained Outcome: Progressing   Problem: Activity: Goal: Risk for activity intolerance will decrease Outcome: Progressing

## 2022-10-20 NOTE — TOC Transition Note (Signed)
Transition of Care [facility]) - CM/SW Discharge Note   Patient Details  Name: Steven Bradley MRN: [id number] Date of Birth: 03/21/1945  [facility] CM/SW Contact:  Amada Jupiter, LCSW Phone Number: 10/20/2022, 12:50 PM   Clinical Narrative:     Met with pt and wife in the room as they are preparing for home dc.  Informed wife that [facility] had been set up with [facility] as before and she quickly asked that I cancel these services.  Notes her elderly mother now in the home and "I don't want people coming in and out".  Pt is agreement with wife and asks the [facility] be cancelled.  Have alerted [facility].  No further TOC needs.  Final next level of care: Home/Self Care Barriers to Discharge: Barriers Resolved   Patient Goals and CMS Choice      Discharge Placement                         Discharge Plan and Services Additional resources added to the After Visit Summary for   In-house Referral: Clinical Social Work                        HH Arranged: NA HH Agency: NA Date HH Agency Contacted:  Time HH Agency Contacted:  Representative spoke with at [facility] Agency: NA  Social Determinants of Health (SDOH) Interventions SDOH Screenings   Food Insecurity: No Food Insecurity (10/19/2022)  Housing: Low Risk  (10/19/2022)  Recent Concern: Housing - High Risk (10/17/2022)  Transportation Needs: No Transportation Needs (10/19/2022)  Utilities: Not At Risk (10/19/2022)  Financial Resource Strain: Low Risk  (05/05/2022)   Received from [facility]  Social Connections: Unknown (04/29/2022)   Received from [facility]  Stress: No Stress Concern Present (04/29/2022)   Received from [facility], [facility]  Tobacco Use: Low Risk  (10/17/2022)     Readmission Risk Interventions    10/19/2022    2:45 PM  Readmission Risk Prevention Plan  Post Dischage Appt Complete  Medication Screening Complete  Transportation Screening Complete

## 2022-10-20 NOTE — Discharge Instructions (Addendum)
Stop taking Lipitor. You need Liver function check in 1 week  You were prescribe oral antibiotic for 3 days for urine infection.

## 2022-11-09 ENCOUNTER — Ambulatory Visit: Payer: No Typology Code available for payment source | Admitting: Neurology

## 2023-02-14 IMAGING — CR DG PORTABLE PELVIS
1 series · 1 of 1 positions shown · non-contrast
Comparison: CT abdomen pelvis dated June 01, 2016.

CLINICAL DATA: Low back pain after fall.

EXAM:
PORTABLE PELVIS 1-2 VIEWS

[x pelvis]
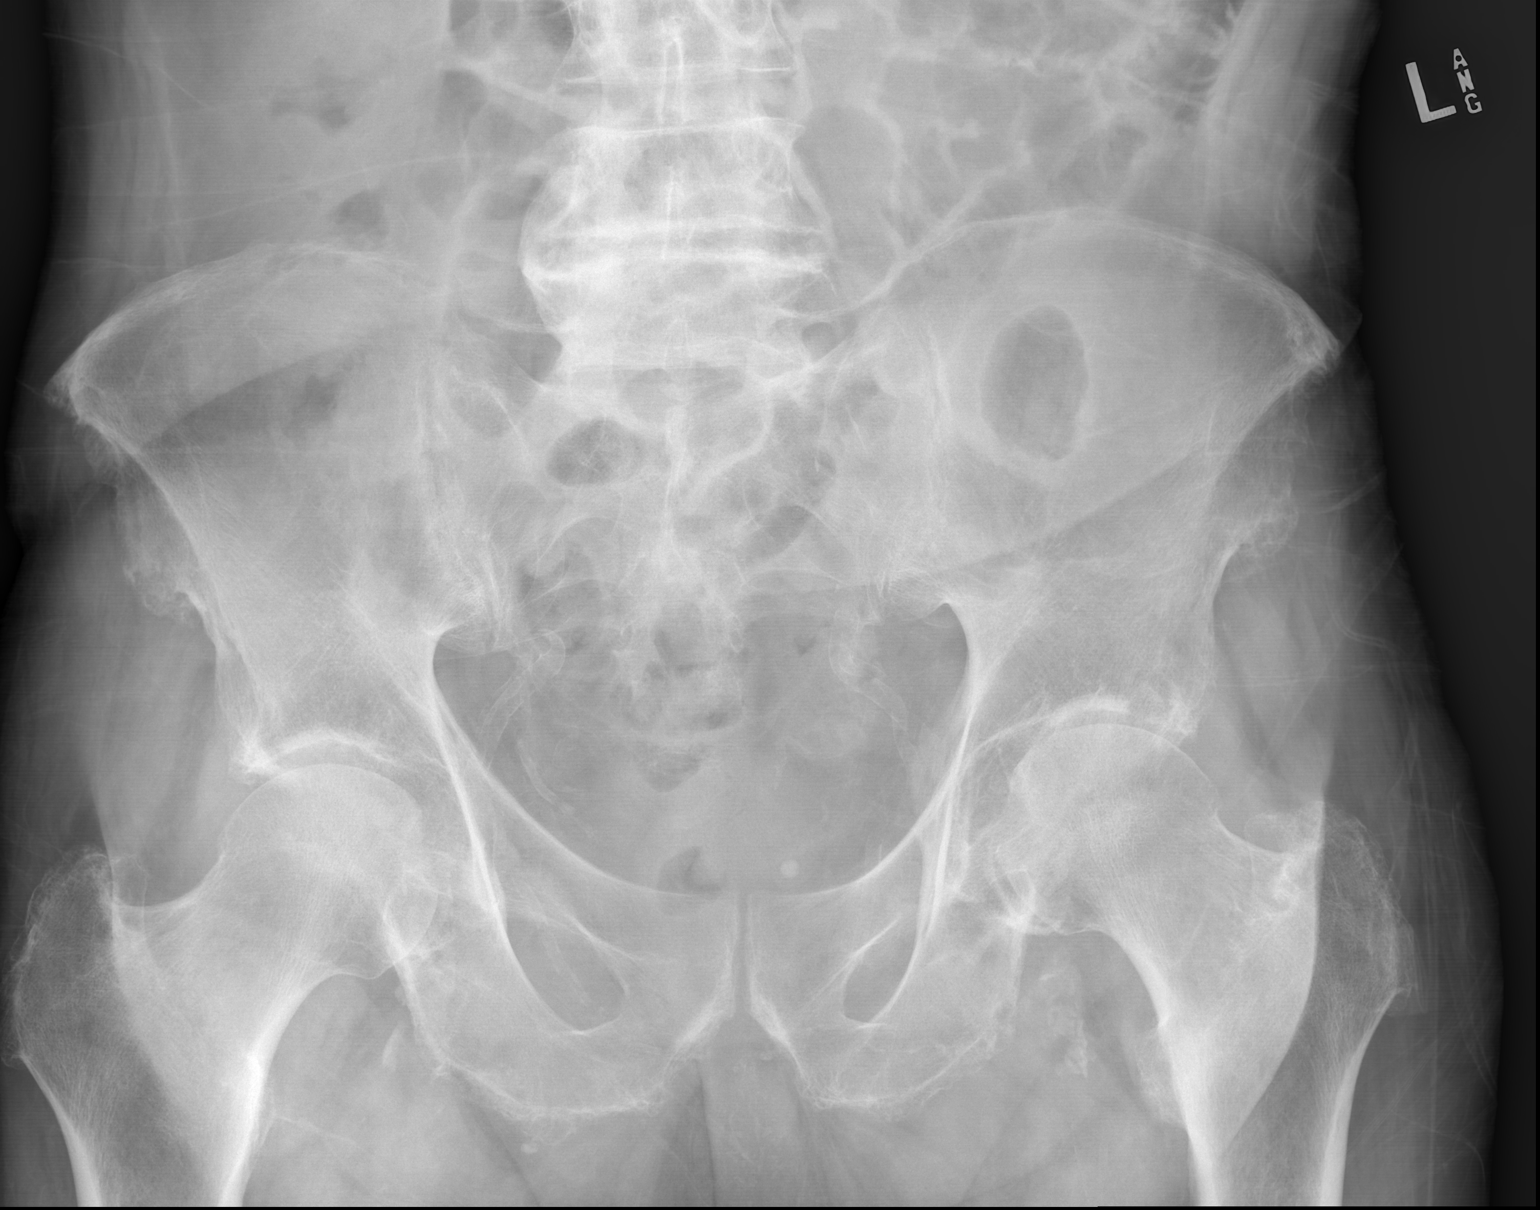

[1 of 1 positions shown; findings below may reference images not displayed]

FINDINGS: There is no evidence of pelvic fracture or diastasis. No pelvic bone
lesions are seen. Mild left hip osteoarthritis.
IMPRESSION: 1. No acute osseous abnormality.

## 2023-02-14 IMAGING — CT CT CERVICAL SPINE W/O CM
3 of 4 series · 13 of 35 positions shown, 16 images · non-contrast
Comparison: 03/02/2009

CLINICAL DATA: Head trauma, minor (Age >= 65y); Neck trauma (Age >=
65y)

EXAM:
CT HEAD WITHOUT CONTRAST
CT CERVICAL SPINE WITHOUT CONTRAST
TECHNIQUE: Multidetector CT imaging of the head and cervical spine was
performed following the standard protocol without intravenous
contrast. Multiplanar CT image reconstructions of the cervical spine
were also generated.

[Series 5: orthogonal bone · axial · 0.28mm/px · z∈[-330,-183]mm · 5 of 122 slices shown, 7 images]
[im 18/122  soft-tissue]
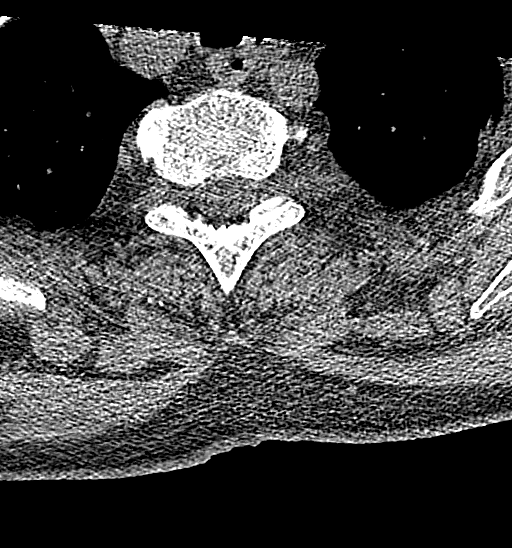
[im 18/122  bone]
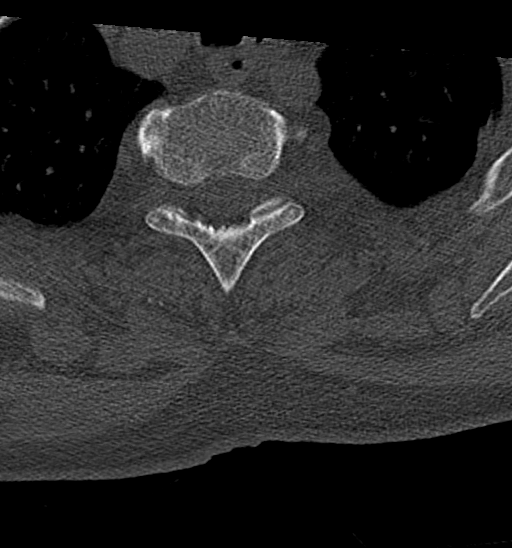
[im 35/122  bone]
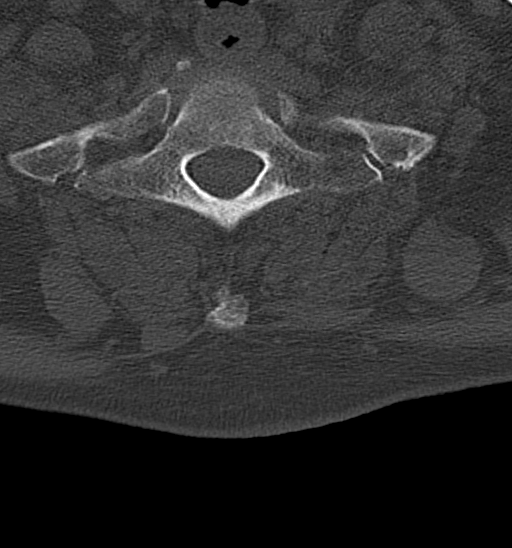
[im 70/122  bone]
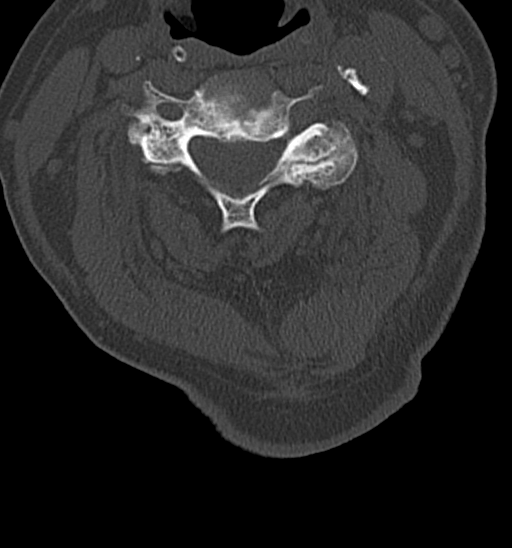
[im 87/122  bone]
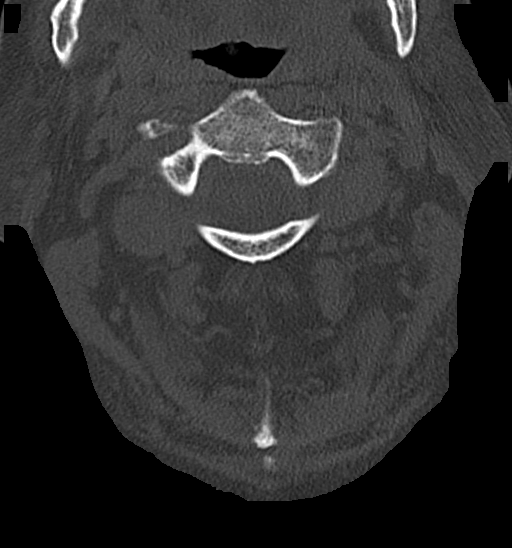
[im 104/122  soft-tissue]
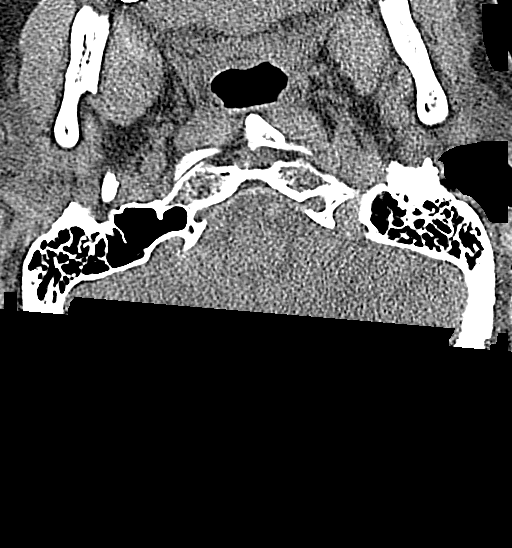
[im 104/122  bone]
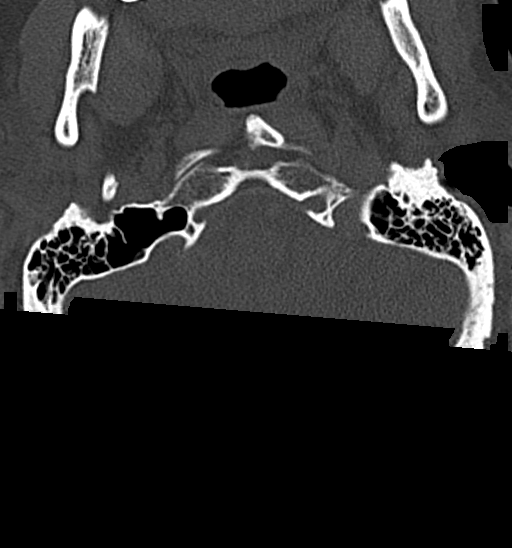

[Series 6: coronal bone · coronal · 0.36mm/px · 3 of 64 slices shown]
[im 20/64  bone]
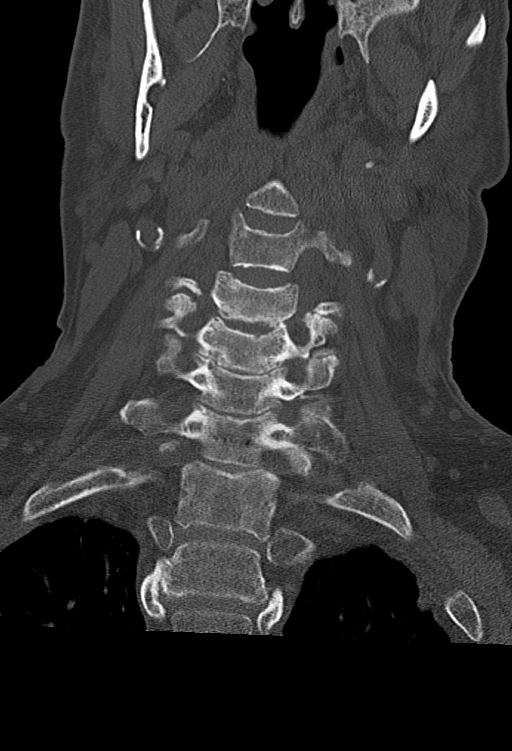
[im 28/64  bone]
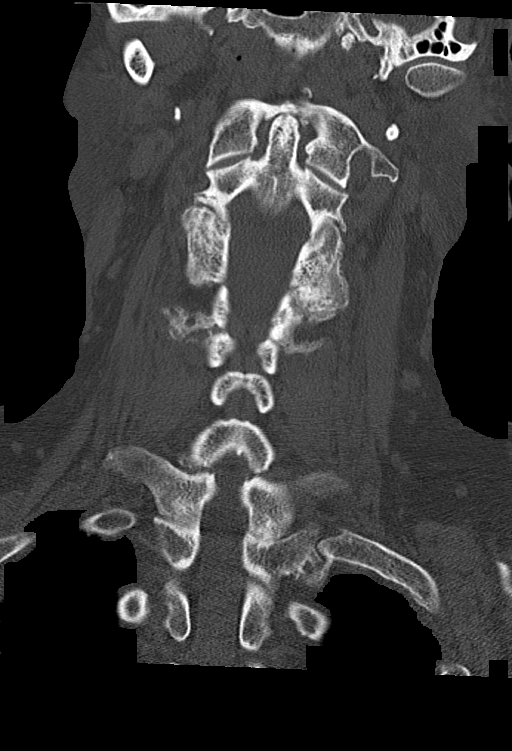
[im 36/64  bone]
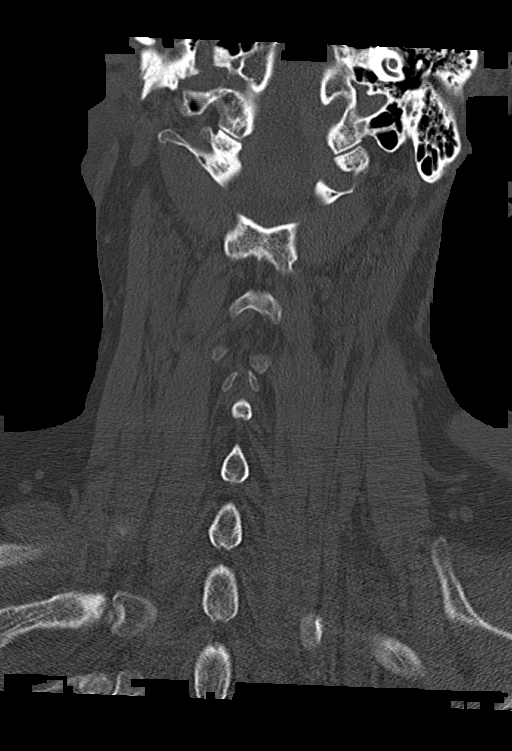

[Series 7: sagittal bone · sagittal · 0.42mm/px · 5 of 61 slices shown, 6 images]
[im 21/61  bone]
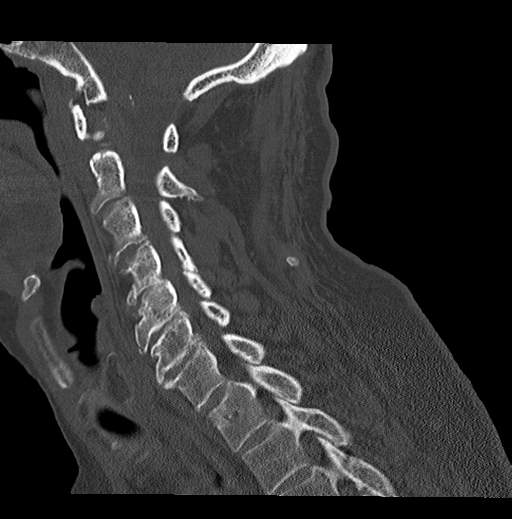
[im 26/61  bone]
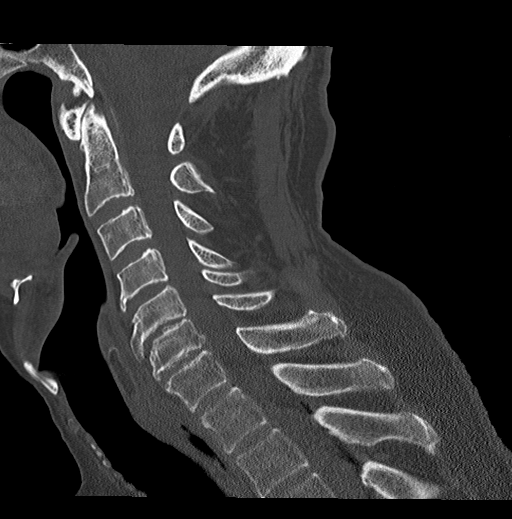
[im 31/61  soft-tissue]
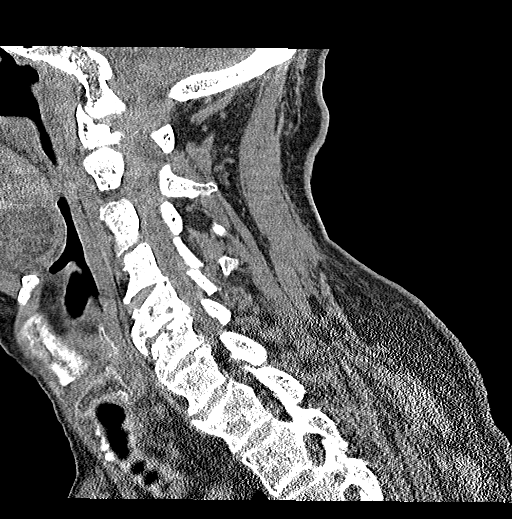
[im 31/61  bone]
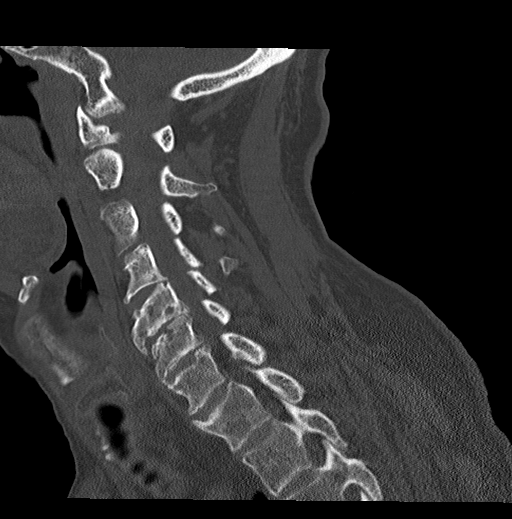
[im 36/61  bone]
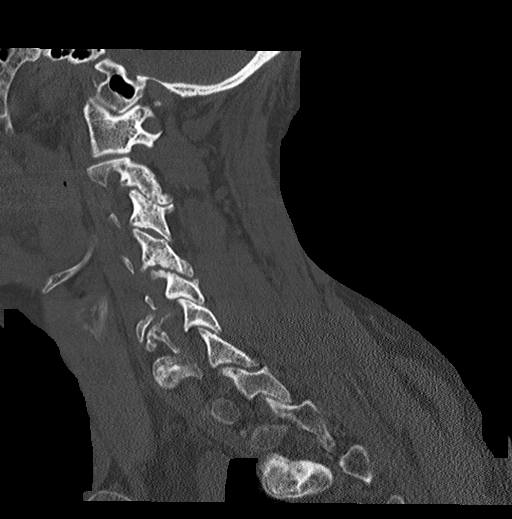
[im 41/61  bone]
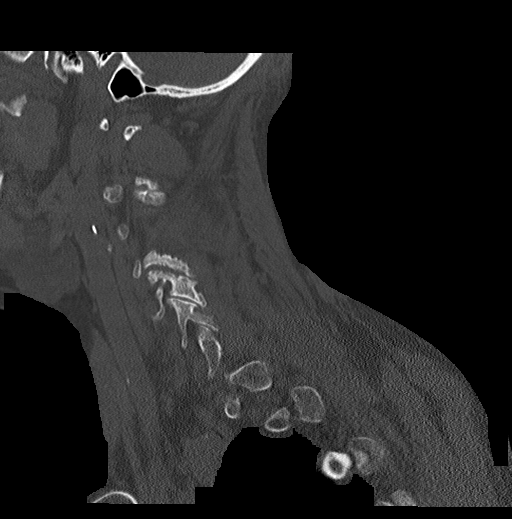

[13 of 35 positions shown; findings below may reference images not displayed]

FINDINGS: CT HEAD FINDINGS

Brain: No evidence of acute infarction, hemorrhage, hydrocephalus,
extra-axial collection or mass lesion/mass effect. Scattered
low-density changes within the periventricular and subcortical white
matter compatible with chronic microvascular ischemic change. Mild
diffuse cerebral volume loss.

Vascular: Atherosclerotic calcifications involving the large vessels
of the skull base. No unexpected hyperdense vessel.

Skull: Normal. Negative for fracture or focal lesion.

Sinuses/Orbits: Minimal mucosal thickening in the right maxillary
sinus.

Other: Negative for scalp hematoma.

CT CERVICAL SPINE FINDINGS

Alignment: Facet joints are aligned without dislocation or traumatic
listhesis. Dens and lateral masses are aligned.

Skull base and vertebrae: No acute fracture. No primary bone lesion
or focal pathologic process.

Soft tissues and spinal canal: No prevertebral fluid or swelling. No
visible canal hematoma.

Disc levels: Progressive degenerative disc disease at the C5-6 and
C6-7 levels with canal stenosis suspected at both levels. Moderate
multilevel bilateral facet arthropathy, right worse than left, also
progressed from prior. Slight asymmetric widening of the right C4-5
facet joint with subchondral irregularity may be related to joint
effusion and possible inflammatory arthropathy (series 6, image 25).

Upper chest: Included lung apices are clear.

Other: Bilateral carotid atherosclerosis.
IMPRESSION: 1. No acute intracranial findings.
2. No acute fracture or traumatic listhesis of the cervical spine.
3. Slight asymmetric widening of the right C4-5 facet joint with
subchondral irregularity may be related to joint effusion and
associated arthritis or possible inflammatory arthropathy. Although
felt to be less likely, septic arthritis of the facet joint could
have this appearance in the appropriate clinical setting.
4. Progressive degenerative disc disease and facet arthropathy of
the cervical spine.

## 2023-02-17 ENCOUNTER — Inpatient Hospital Stay (HOSPITAL_COMMUNITY): Payer: No Typology Code available for payment source

## 2023-02-17 ENCOUNTER — Other Ambulatory Visit: Payer: Self-pay

## 2023-02-17 ENCOUNTER — Emergency Department (HOSPITAL_COMMUNITY): Payer: No Typology Code available for payment source

## 2023-02-17 ENCOUNTER — Inpatient Hospital Stay (HOSPITAL_COMMUNITY)
Admission: EM | Admit: 2023-02-17 | Discharge: 2023-02-25 | DRG: 469 | Disposition: A | Discharge status: Skilled Nursing Facility | Payer: No Typology Code available for payment source | Attending: Family Medicine | Admitting: Family Medicine

## 2023-02-17 ENCOUNTER — Encounter (HOSPITAL_COMMUNITY): Payer: Self-pay

## 2023-02-17 DIAGNOSIS — I10 Essential (primary) hypertension: Secondary | ICD-10-CM | POA: Diagnosis present

## 2023-02-17 DIAGNOSIS — I480 Paroxysmal atrial fibrillation: Secondary | ICD-10-CM | POA: Diagnosis present

## 2023-02-17 DIAGNOSIS — K7030 Alcoholic cirrhosis of liver without ascites: Secondary | ICD-10-CM | POA: Diagnosis present

## 2023-02-17 DIAGNOSIS — S72002A Fracture of unspecified part of neck of left femur, initial encounter for closed fracture: Secondary | ICD-10-CM | POA: Diagnosis not present

## 2023-02-17 DIAGNOSIS — F4310 Post-traumatic stress disorder, unspecified: Secondary | ICD-10-CM | POA: Diagnosis present

## 2023-02-17 DIAGNOSIS — R55 Syncope and collapse: Secondary | ICD-10-CM | POA: Diagnosis not present

## 2023-02-17 DIAGNOSIS — Y9289 Other specified places as the place of occurrence of the external cause: Secondary | ICD-10-CM

## 2023-02-17 DIAGNOSIS — S42231A 3-part fracture of surgical neck of right humerus, initial encounter for closed fracture: Secondary | ICD-10-CM | POA: Diagnosis present

## 2023-02-17 DIAGNOSIS — D7589 Other specified diseases of blood and blood-forming organs: Secondary | ICD-10-CM | POA: Diagnosis present

## 2023-02-17 DIAGNOSIS — Y93E1 Activity, personal bathing and showering: Secondary | ICD-10-CM

## 2023-02-17 DIAGNOSIS — M1009 Idiopathic gout, multiple sites: Secondary | ICD-10-CM | POA: Diagnosis not present

## 2023-02-17 DIAGNOSIS — R569 Unspecified convulsions: Principal | ICD-10-CM

## 2023-02-17 DIAGNOSIS — W182XXA Fall in (into) shower or empty bathtub, initial encounter: Secondary | ICD-10-CM | POA: Diagnosis present

## 2023-02-17 DIAGNOSIS — E8809 Other disorders of plasma-protein metabolism, not elsewhere classified: Secondary | ICD-10-CM | POA: Diagnosis present

## 2023-02-17 DIAGNOSIS — G40909 Epilepsy, unspecified, not intractable, without status epilepticus: Secondary | ICD-10-CM | POA: Diagnosis present

## 2023-02-17 DIAGNOSIS — N400 Enlarged prostate without lower urinary tract symptoms: Secondary | ICD-10-CM | POA: Diagnosis present

## 2023-02-17 DIAGNOSIS — E871 Hypo-osmolality and hyponatremia: Secondary | ICD-10-CM | POA: Diagnosis present

## 2023-02-17 DIAGNOSIS — Z881 Allergy status to other antibiotic agents status: Secondary | ICD-10-CM

## 2023-02-17 DIAGNOSIS — M25511 Pain in right shoulder: Secondary | ICD-10-CM | POA: Diagnosis present

## 2023-02-17 DIAGNOSIS — E785 Hyperlipidemia, unspecified: Secondary | ICD-10-CM | POA: Diagnosis present

## 2023-02-17 DIAGNOSIS — D62 Acute posthemorrhagic anemia: Secondary | ICD-10-CM | POA: Diagnosis present

## 2023-02-17 DIAGNOSIS — Z66 Do not resuscitate: Secondary | ICD-10-CM | POA: Diagnosis present

## 2023-02-17 DIAGNOSIS — R7989 Other specified abnormal findings of blood chemistry: Secondary | ICD-10-CM | POA: Diagnosis present

## 2023-02-17 DIAGNOSIS — E7849 Other hyperlipidemia: Secondary | ICD-10-CM | POA: Diagnosis not present

## 2023-02-17 DIAGNOSIS — Z8546 Personal history of malignant neoplasm of prostate: Secondary | ICD-10-CM

## 2023-02-17 DIAGNOSIS — Z79899 Other long term (current) drug therapy: Secondary | ICD-10-CM

## 2023-02-17 DIAGNOSIS — Z888 Allergy status to other drugs, medicaments and biological substances status: Secondary | ICD-10-CM

## 2023-02-17 DIAGNOSIS — I4891 Unspecified atrial fibrillation: Secondary | ICD-10-CM | POA: Diagnosis not present

## 2023-02-17 DIAGNOSIS — Z96642 Presence of left artificial hip joint: Secondary | ICD-10-CM | POA: Diagnosis not present

## 2023-02-17 DIAGNOSIS — R739 Hyperglycemia, unspecified: Secondary | ICD-10-CM | POA: Diagnosis present

## 2023-02-17 DIAGNOSIS — S42251A Displaced fracture of greater tuberosity of right humerus, initial encounter for closed fracture: Secondary | ICD-10-CM | POA: Diagnosis not present

## 2023-02-17 DIAGNOSIS — F1010 Alcohol abuse, uncomplicated: Secondary | ICD-10-CM | POA: Diagnosis present

## 2023-02-17 DIAGNOSIS — S72092A Other fracture of head and neck of left femur, initial encounter for closed fracture: Secondary | ICD-10-CM | POA: Diagnosis present

## 2023-02-17 DIAGNOSIS — D696 Thrombocytopenia, unspecified: Secondary | ICD-10-CM | POA: Diagnosis present

## 2023-02-17 DIAGNOSIS — E876 Hypokalemia: Secondary | ICD-10-CM | POA: Diagnosis present

## 2023-02-17 DIAGNOSIS — R32 Unspecified urinary incontinence: Secondary | ICD-10-CM | POA: Diagnosis present

## 2023-02-17 DIAGNOSIS — F32A Depression, unspecified: Secondary | ICD-10-CM | POA: Diagnosis present

## 2023-02-17 DIAGNOSIS — M1A9XX0 Chronic gout, unspecified, without tophus (tophi): Secondary | ICD-10-CM | POA: Diagnosis present

## 2023-02-17 DIAGNOSIS — K219 Gastro-esophageal reflux disease without esophagitis: Secondary | ICD-10-CM | POA: Diagnosis present

## 2023-02-17 DIAGNOSIS — S01511A Laceration without foreign body of lip, initial encounter: Secondary | ICD-10-CM | POA: Diagnosis present

## 2023-02-17 DIAGNOSIS — M109 Gout, unspecified: Secondary | ICD-10-CM | POA: Diagnosis present

## 2023-02-17 LAB — CBC WITH DIFFERENTIAL/PLATELET
Abs Immature Granulocytes: 0.11 10*3/uL — ABNORMAL HIGH (ref 0.00–0.07)
Basophils Absolute: 0 10*3/uL (ref 0.0–0.1)
Basophils Relative: 0 %
Eosinophils Absolute: 0 10*3/uL (ref 0.0–0.5)
Eosinophils Relative: 0 %
HCT: 42.9 % (ref 39.0–52.0)
Hemoglobin: 14.6 g/dL (ref 13.0–17.0)
Immature Granulocytes: 1 %
Lymphocytes Relative: 7 %
Lymphs Abs: 0.7 10*3/uL (ref 0.7–4.0)
MCH: 34.2 pg — ABNORMAL HIGH (ref 26.0–34.0)
MCHC: 34 g/dL (ref 30.0–36.0)
MCV: 100.5 fL — ABNORMAL HIGH (ref 80.0–100.0)
Monocytes Absolute: 0.6 10*3/uL (ref 0.1–1.0)
Monocytes Relative: 7 %
Neutro Abs: 8.3 10*3/uL — ABNORMAL HIGH (ref 1.7–7.7)
Neutrophils Relative %: 85 %
Platelets: 99 10*3/uL — ABNORMAL LOW (ref 150–400)
RBC: 4.27 MIL/uL (ref 4.22–5.81)
RDW: 13.5 % (ref 11.5–15.5)
WBC: 9.9 10*3/uL (ref 4.0–10.5)
nRBC: 0.2 % (ref 0.0–0.2)

## 2023-02-17 LAB — COMPREHENSIVE METABOLIC PANEL
ALT: 74 U/L — ABNORMAL HIGH (ref 0–44)
AST: 247 U/L — ABNORMAL HIGH (ref 15–41)
Albumin: 3.8 g/dL (ref 3.5–5.0)
Alkaline Phosphatase: 186 U/L — ABNORMAL HIGH (ref 38–126)
Anion gap: 19 — ABNORMAL HIGH (ref 5–15)
BUN: 13 mg/dL (ref 8–23)
CO2: 20 mmol/L — ABNORMAL LOW (ref 22–32)
Calcium: 8.3 mg/dL — ABNORMAL LOW (ref 8.9–10.3)
Chloride: 94 mmol/L — ABNORMAL LOW (ref 98–111)
Creatinine, Ser: 0.97 mg/dL (ref 0.61–1.24)
GFR, Estimated: >60 mL/min (ref >60)
Glucose, Bld: 198 mg/dL — ABNORMAL HIGH (ref 70–99)
Potassium: 2.5 mmol/L — CL (ref 3.5–5.1)
Sodium: 133 mmol/L — ABNORMAL LOW (ref 135–145)
Total Bilirubin: 3.5 mg/dL — ABNORMAL HIGH (ref <1.2)
Total Protein: 8.8 g/dL — ABNORMAL HIGH (ref 6.5–8.1)

## 2023-02-17 LAB — CBG MONITORING, ED: Glucose-Capillary: 183 mg/dL — ABNORMAL HIGH (ref 70–99)

## 2023-02-17 LAB — MRSA NEXT GEN BY PCR, NASAL: MRSA by PCR Next Gen: NOT DETECTED

## 2023-02-17 LAB — ETHANOL: Alcohol, Ethyl (B): <10 mg/dL (ref <10)

## 2023-02-17 LAB — TSH: TSH: 2.421 u[IU]/mL (ref 0.350–4.500)

## 2023-02-17 LAB — MAGNESIUM: Magnesium: 0.8 mg/dL — CL (ref 1.7–2.4)

## 2023-02-17 LAB — PHOSPHORUS: Phosphorus: 2.2 mg/dL — ABNORMAL LOW (ref 2.5–4.6)

## 2023-02-17 MED ORDER — LEVETIRACETAM 500 MG/5ML IV SOLN
INTRAVENOUS | Status: AC
  Filled 2023-02-17: qty 15

## 2023-02-17 MED ORDER — DILTIAZEM LOAD VIA INFUSION
10.0000 mg | Freq: Once | INTRAVENOUS | Status: AC
  Administered 2023-02-17: 10 mg via INTRAVENOUS
  Filled 2023-02-17: qty 10

## 2023-02-17 MED ORDER — SODIUM CHLORIDE 0.9 % IV SOLN
1250.0000 mg | Freq: Two times a day (BID) | INTRAVENOUS | Status: DC
  Administered 2023-02-18 (×2): 1250 mg via INTRAVENOUS
  Filled 2023-02-17 (×5): qty 12.5

## 2023-02-17 MED ORDER — MAGNESIUM SULFATE 2 GM/50ML IV SOLN
2.0000 g | Freq: Once | INTRAVENOUS | Status: AC
  Administered 2023-02-17: 2 g via INTRAVENOUS
  Filled 2023-02-17: qty 50

## 2023-02-17 MED ORDER — FOLIC ACID 5 MG/ML IJ SOLN
1.0000 mg | Freq: Every day | INTRAMUSCULAR | Status: DC
  Administered 2023-02-17 – 2023-02-19 (×3): 1 mg via INTRAVENOUS
  Filled 2023-02-17 (×6): qty 0.2

## 2023-02-17 MED ORDER — LORAZEPAM 2 MG/ML IJ SOLN
1.0000 mg | INTRAMUSCULAR | Status: DC | PRN
  Administered 2023-02-18: 1 mg via INTRAVENOUS
  Filled 2023-02-17: qty 1

## 2023-02-17 MED ORDER — POTASSIUM CHLORIDE 10 MEQ/100ML IV SOLN
10.0000 meq | INTRAVENOUS | Status: AC
Start: 2023-02-17 — End: 2023-02-18
  Administered 2023-02-17 (×3): 10 meq via INTRAVENOUS
  Filled 2023-02-17 (×3): qty 100

## 2023-02-17 MED ORDER — HYDROCODONE-ACETAMINOPHEN 5-325 MG PO TABS
1.0000 | ORAL_TABLET | Freq: Four times a day (QID) | ORAL | Status: DC | PRN
Start: 2023-02-17 — End: 2023-02-17

## 2023-02-17 MED ORDER — POTASSIUM CHLORIDE CRYS ER 20 MEQ PO TBCR: 40.0000 meq | EXTENDED_RELEASE_TABLET | Freq: Once | ORAL | Status: DC

## 2023-02-17 MED ORDER — POTASSIUM CHLORIDE CRYS ER 20 MEQ PO TBCR
40.0000 meq | EXTENDED_RELEASE_TABLET | Freq: Once | ORAL | Status: DC
  Filled 2023-02-17: qty 2

## 2023-02-17 MED ORDER — MORPHINE SULFATE (PF) 2 MG/ML IV SOLN
0.5000 mg | INTRAVENOUS | Status: DC | PRN
  Administered 2023-02-17 – 2023-02-20 (×6): 0.5 mg via INTRAVENOUS
  Filled 2023-02-17 (×6): qty 1

## 2023-02-17 MED ORDER — POTASSIUM PHOSPHATES 15 MMOLE/5ML IV SOLN
30.0000 mmol | Freq: Once | INTRAVENOUS | Status: DC
  Filled 2023-02-17: qty 10

## 2023-02-17 MED ORDER — LEVETIRACETAM IN NACL 1500 MG/100ML IV SOLN
1500.0000 mg | Freq: Once | INTRAVENOUS | Status: AC
  Administered 2023-02-17: 1500 mg via INTRAVENOUS

## 2023-02-17 MED ORDER — SODIUM CHLORIDE 0.9 % IV SOLN: 1.0000 mg | Freq: Every day | INTRAVENOUS | Status: DC

## 2023-02-17 MED ORDER — CHLORHEXIDINE GLUCONATE CLOTH 2 % EX PADS
6.0000 | MEDICATED_PAD | Freq: Every day | CUTANEOUS | Status: DC
  Administered 2023-02-18 – 2023-02-24 (×5): 6 via TOPICAL

## 2023-02-17 MED ORDER — SODIUM PHOSPHATES 45 MMOLE/15ML IV SOLN
30.0000 mmol | Freq: Once | INTRAVENOUS | Status: AC
  Administered 2023-02-18: 30 mmol via INTRAVENOUS
  Filled 2023-02-17 (×2): qty 10

## 2023-02-17 MED ORDER — DILTIAZEM HCL-DEXTROSE 125-5 MG/125ML-% IV SOLN (PREMIX)
5.0000 mg/h | INTRAVENOUS | Status: AC
  Administered 2023-02-17 – 2023-02-18 (×2): 5 mg/h via INTRAVENOUS
  Filled 2023-02-17 (×2): qty 125

## 2023-02-17 MED ORDER — SODIUM CHLORIDE 0.9 % IV SOLN
500.0000 mg | INTRAVENOUS | Status: DC
  Administered 2023-02-17: 500 mg via INTRAVENOUS
  Filled 2023-02-17 (×2): qty 5

## 2023-02-17 MED ORDER — POTASSIUM CHLORIDE CRYS ER 20 MEQ PO TBCR: 40.0000 meq | EXTENDED_RELEASE_TABLET | Freq: Four times a day (QID) | ORAL | Status: DC

## 2023-02-17 MED ORDER — ONDANSETRON HCL 4 MG/2ML IJ SOLN
4.0000 mg | Freq: Four times a day (QID) | INTRAMUSCULAR | Status: DC | PRN
  Administered 2023-02-17: 4 mg via INTRAVENOUS
  Filled 2023-02-17: qty 2

## 2023-02-17 MED ORDER — POTASSIUM CHLORIDE 10 MEQ/100ML IV SOLN
10.0000 meq | INTRAVENOUS | Status: AC
  Administered 2023-02-17 (×4): 10 meq via INTRAVENOUS
  Filled 2023-02-17 (×4): qty 100

## 2023-02-17 NOTE — ED Triage Notes (Signed)
Pt had a fall in the shower this morning and refused ems to transport.  Pt has a swollen lip and left hip pain from the fall.  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

## 2023-02-17 NOTE — H&P (Signed)
TRH H&P   Patient Demographics:    Steven Bradley, is a 77 y.o. male  MRN: [id number]   DOB - October 25, 1945  Admit Date - 02/17/2023  Outpatient Primary MD for the patient is [facility]  Referring MD/NP/PA: PA Triplett   Patient coming from: Home  Chief Complaint  Patient presents with   Seizures      HPI:    Steven Bradley  is a 77 y.o. male, with past medical history significant for EtOH use disorder, EtOH cirrhosis, HTN, HLD, prostate cancer, epilepsy, patient presented to ED secondary to fall and seizures -Wife at bedside provide the history, she reports patient with heavy drinking, he did cut down but still with significant alcohol use, last drink was 2 ago, she hears a loud noise at the bathroom in their RV, where she found him on the floor, this happened around 9:30 AM, patient reports he struck his face on the toilet, had bleeding and swelling of his lips, there was no loss of consciousness noted, he refused transport by EMS, wife assisted him to the couch where an hour later where he was witnessed to have seizures, lasted for 7 to 8 minutes, it was witnessed by brother-in-law, will describe generalized jerking and shaking, reports patient did not have seizures for last 31-month, and he has been compliant with his meds Keppra,.  Patient with left hip pain, wife thought secondary to gout (x-ray was significant for femur fracture), as well he does endorse right shoulder pain as well which they think it is due to gout as well( shoulder x-ray still pending). -In ED patient was noted to be with significant lip bruise, tongue bleed, he was noted to be in new onset A-fib with RVR, requiring Cardizem drip, he had severe electrolyte derangement loading sodium of 133, potassium of 2.5, magnesium of 0.8, phosphorus of 2.2, anion gap of 19, AST of 247, ALT of 74, total bili of 3.5, his  MCV was 100.5, and platelet count was 99K, his x-ray significant for left hip fracture as well, [facility] consulted to admit.    Review of systems:      A full 10 point Review of Systems was done, except as stated above, all other Review of Systems were negative.   With Past History of the following :    Past Medical History:  Diagnosis Date   Arthritis    Enlarged prostate    GERD (gastroesophageal reflux disease)    Gout    Patient doesn't recall having gout but was prescribed allopurinol for ankle swelling.   Hyperlipidemia    Hypertension    Prostate cancer [facility]) June 2015   PTSD (post-traumatic stress disorder)    Syncope    Negative outpatient workup.   Vitamin D deficiency       Past Surgical History:  Procedure Laterality Date   PROSTATE BIOPSY  June 2015  PROSTATE SURGERY     Right hand surgery     Scrapnel removal from scalp     During [place] war      Social History:     Social History   Tobacco Use   Smoking status: Never   Smokeless tobacco: Never  Substance Use Topics   Alcohol use: Yes    Comment: occasionaly        Family History :     Family History  Problem Relation Age of Onset   Cancer Father      Home Medications:   Prior to Admission medications   Medication Sig Start Date End Date Taking? Authorizing Provider  allopurinol (ZYLOPRIM) 100 MG tablet Take 100 mg by mouth daily. 06/01/22   [provider]  amLODipine (NORVASC) 5 MG tablet Take 5 mg by mouth daily. 06/01/22   [provider]  capsaicin (ZOSTRIX) 0.025 % cream Apply 1 Application topically 2 (two) times daily as needed (skin). 09/27/22   [provider]  colchicine 0.6 MG tablet Take 0.6 mg by mouth daily as needed (gout). 05/08/20   [provider]  diclofenac Sodium (VOLTAREN) 1 % GEL Apply 2 g topically 3 (three) times daily as needed (Neck and hand pain). 09/27/22   [provider]  furosemide (LASIX) 40 MG  tablet Take 40 mg by mouth daily.    [provider]  gabapentin (NEURONTIN) 100 MG capsule Take 100 mg by mouth 3 (three) times daily.    [provider]  levETIRAcetam (KEPPRA) 1000 MG tablet Take 1,000 mg by mouth 2 (two) times daily. 05/10/22   [provider]  loratadine (CLARITIN) 10 MG tablet Take 10 mg by mouth daily as needed for allergies. 05/08/20   [provider]  magnesium oxide (MAG-OX) 400 (241.3 Mg) MG tablet Take 400 mg by mouth daily.    [provider]  Multiple Vitamin (MULTIVITAMIN WITH MINERALS) TABS tablet Take 1 tablet by mouth daily.    [provider]  naproxen (NAPROSYN) 500 MG tablet Take 500 mg by mouth 2 (two) times daily with a meal.    [provider]  omeprazole (PRILOSEC) 20 MG capsule Take 2 capsules (40 mg total) by mouth 2 (two) times daily before a meal. 12/24/18   Marinda Elk, MD  sertraline (ZOLOFT) 100 MG tablet Take 100 mg by mouth daily.    [provider]  tadalafil (CIALIS) 5 MG tablet Take 5 mg by mouth daily as needed for erectile dysfunction. 06/01/22   [provider]  Tetrahydroz-Dextran-PEG-Povid 0.05-0.1-1-1 % SOLN Place 1 drop into both eyes daily.    [provider]  traZODone (DESYREL) 100 MG tablet Take 100 mg by mouth at bedtime.    [provider]     Allergies:     Allergies  Allergen Reactions   Viagra [Sildenafil] Nausea And Vomiting   Ciprofloxacin Rash   Spironolactone Rash   Vitamin D Analogs Rash     Physical Exam:   Vitals  Blood pressure (!) 133/98, pulse (!) 111, temperature (!) 97.1 F (36.2 C), temperature source Oral, resp. rate (!) 22, height 6\' 6"  (1.981 m), weight 87.1 kg, SpO2 98%.   1. General Frail, appearing male, laying in bed, no apparent distress  2.,  Alert and oriented, but slow to respond  3. No F.N deficits, ALL C.Nerves Intact, Strength 5/5 all 4 extremities, Sensation intact all 4  extremities, Plantars down going.  4. Ears and Eyes appear  Normal, Conjunctivae clear, PERRLA.  Tongue and lip bruising, please see picture below  5. Supple Neck, No JVD, No cervical lymphadenopathy appriciated, No Carotid Bruits.  6. Symmetrical Chest wall movement, Good air movement bilaterally, CTAB.  7.  Irregular, tachycardic, No Gallops, Rubs or Murmurs, No Parasternal Heave.  8. Positive Bowel Sounds, Abdomen Soft, No tenderness, No organomegaly appriciated,No rebound -guarding or rigidity.  9.  No Cyanosis, Normal Skin Turgor, No Skin Rash or Bruise.  10. Good muscle tone,  left lower extremity shortened, with limited range of motion due to pain     Data Review:    CBC Recent Labs  Lab 02/17/23 1330  WBC 9.9  HGB 14.6  HCT 42.9  PLT 99*  MCV 100.5*  MCH 34.2*  MCHC 34.0  RDW 13.5  LYMPHSABS 0.7  MONOABS 0.6  EOSABS 0.0  BASOSABS 0.0   ------------------------------------------------------------------------------------------------------------------  Chemistries  Recent Labs  Lab 02/17/23 1330  NA 133*  K 2.5*  CL 94*  CO2 20*  GLUCOSE 198*  BUN 13  CREATININE 0.97  CALCIUM 8.3*  MG 0.8*  AST 247*  ALT 74*  ALKPHOS 186*  BILITOT 3.5*   ------------------------------------------------------------------------------------------------------------------ estimated creatinine clearance is 79.8 mL/min (by C-G formula based on SCr of 0.97 mg/dL). ------------------------------------------------------------------------------------------------------------------ No results for input(s): "TSH", "T4TOTAL", "T3FREE", "THYROIDAB" in the last 72 hours.  Invalid input(s): "FREET3"  Coagulation profile No results for input(s): "INR", "PROTIME" in the last 168 hours. ------------------------------------------------------------------------------------------------------------------- No results for input(s): "DDIMER" in the last 72  hours. -------------------------------------------------------------------------------------------------------------------  Cardiac Enzymes No results for input(s): "CKMB", "TROPONINI", "MYOGLOBIN" in the last 168 hours.  Invalid input(s): "CK" ------------------------------------------------------------------------------------------------------------------ No results found for: "BNP"   ---------------------------------------------------------------------------------------------------------------  Urinalysis    Component Value Date/Time   COLORURINE AMBER (A) 10/17/2022 1233   APPEARANCEUR HAZY (A) 10/17/2022 1233   LABSPEC 1.024 10/17/2022 1233   PHURINE 6.0 10/17/2022 1233   GLUCOSEU NEGATIVE 10/17/2022 1233   HGBUR LARGE (A) 10/17/2022 1233   BILIRUBINUR MODERATE (A) 10/17/2022 1233   KETONESUR 5 (A) 10/17/2022 1233   PROTEINUR >=300 (A) 10/17/2022 1233   UROBILINOGEN 2.0 (H) 03/28/2014 1624   NITRITE POSITIVE (A) 10/17/2022 1233   LEUKOCYTESUR SMALL (A) 10/17/2022 1233    ----------------------------------------------------------------------------------------------------------------   Imaging Results:    DG Chest Portable 1 View  Result Date: 02/17/2023 CLINICAL DATA:  Chest pain EXAM: PORTABLE CHEST 1 VIEW COMPARISON:  05/15/2019 FINDINGS: Atherosclerotic calcification of the aortic arch. Thoracic spondylosis. The lungs appear clear. Upper normal heart size. No findings of pulmonary edema or airspace opacity. No significant blunting of the costophrenic angles observed. IMPRESSION: 1. No acute findings. 2. Upper normal heart size. 3. Thoracic spondylosis. Electronically Signed   By: Gaylyn Rong M.D.   On: 02/17/2023 16:55   DG Knee Complete 4 Views Left  Result Date: 02/17/2023 CLINICAL DATA:  Fall in shower. Left hip pain. Acute left hip fracture. EXAM: LEFT KNEE - COMPLETE 4+ VIEW COMPARISON:  None Available. FINDINGS: Atheromatous vascular calcifications noted.  Small knee effusion in the suprapatellar bursa, less than was shown on 12/24/2015. Medial compartmental articular space narrowing compatible with osteoarthritis. Mild spurring along the femoral trochlear groove. No fracture identified. IMPRESSION: 1. Small knee effusion in the suprapatellar bursa. 2. Medial compartmental osteoarthritis. 3. Atheromatous vascular calcifications. Electronically Signed   By: Gaylyn Rong M.D.   On: 02/17/2023 16:54   DG Pelvis 1-2 Views  Result Date: 02/17/2023 CLINICAL DATA:  Fall in shower today, left hip pain. EXAM: PELVIS -  1-2 VIEW COMPARISON:  10/03/2020 FINDINGS: Acute left femoral neck fracture with mild varus angulation. Degenerative arthropathy of the hips, left greater than right, with associated craniocaudad articular cartilage thinning. Lumbar spondylosis with prominent bony bridging spurring L4-5. Pelvic vascular calcifications noted. IMPRESSION: 1. Acute left femoral neck fracture with mild varus angulation. 2. Degenerative arthropathy of the hips, left greater than right. 3. Lumbar spondylosis. Electronically Signed   By: Gaylyn Rong M.D.   On: 02/17/2023 16:52   CT Head Wo Contrast  Result Date: 02/17/2023 CLINICAL DATA:  Fall, lip swelling, head and face pain EXAM: CT HEAD WITHOUT CONTRAST CT MAXILLOFACIAL WITHOUT CONTRAST CT CERVICAL SPINE WITHOUT CONTRAST TECHNIQUE: Multidetector CT imaging of the head, cervical spine, and maxillofacial structures were performed using the standard protocol without intravenous contrast. Multiplanar CT image reconstructions of the cervical spine and maxillofacial structures were also generated. RADIATION DOSE REDUCTION: This exam was performed according to the departmental dose-optimization program which includes automated exposure control, adjustment of the mA and/or kV according to patient size and/or use of iterative reconstruction technique. COMPARISON:  10/03/2020 FINDINGS: CT HEAD FINDINGS Brain: No evidence  of acute infarction, hemorrhage, hydrocephalus, extra-axial collection or mass lesion/mass effect. Mild periventricular white matter hypodensity. Vascular: No hyperdense vessel or unexpected calcification. CT FACIAL BONES FINDINGS Skull: Normal. Negative for fracture or focal lesion. Facial bones: No displaced fractures or dislocations. Sinuses/Orbits: No acute finding. Other: Soft tissue contusion of the lips (series 3, image 75). CT CERVICAL SPINE FINDINGS Alignment: Normal. Skull base and vertebrae: No acute fracture. No primary bone lesion or focal pathologic process. Soft tissues and spinal canal: No prevertebral fluid or swelling. No visible canal hematoma. Disc levels: Mild-to-moderate disc space height loss and osteophytosis, worst from C5-C7. Upper chest: Negative. Other: None. IMPRESSION: 1. No acute intracranial pathology. 2. No displaced fractures or dislocations of the facial bones. 3. Soft tissue contusion of the lips. 4. No fracture or static subluxation of the cervical spine. 5. 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 Electronically Signed   By: Jearld Lesch M.D.   On: 02/17/2023 15:25   CT Cervical Spine Wo Contrast  Result Date: 02/17/2023 CLINICAL DATA:  Fall, lip swelling, head and face pain EXAM: CT HEAD WITHOUT CONTRAST CT MAXILLOFACIAL WITHOUT CONTRAST CT CERVICAL SPINE WITHOUT CONTRAST TECHNIQUE: Multidetector CT imaging of the head, cervical spine, and maxillofacial structures were performed using the standard protocol without intravenous contrast. Multiplanar CT image reconstructions of the cervical spine and maxillofacial structures were also generated. RADIATION DOSE REDUCTION: This exam was performed according to the departmental dose-optimization program which includes automated exposure control, adjustment of the mA and/or kV according to patient size and/or use of iterative reconstruction technique. COMPARISON:  10/03/2020 FINDINGS: CT HEAD FINDINGS Brain: No  evidence of acute infarction, hemorrhage, hydrocephalus, extra-axial collection or mass lesion/mass effect. Mild periventricular white matter hypodensity. Vascular: No hyperdense vessel or unexpected calcification. CT FACIAL BONES FINDINGS Skull: Normal. Negative for fracture or focal lesion. Facial bones: No displaced fractures or dislocations. Sinuses/Orbits: No acute finding. Other: Soft tissue contusion of the lips (series 3, image 75). CT CERVICAL SPINE FINDINGS Alignment: Normal. Skull base and vertebrae: No acute fracture. No primary bone lesion or focal pathologic process. Soft tissues and spinal canal: No prevertebral fluid or swelling. No visible canal hematoma. Disc levels: Mild-to-moderate disc space height loss and osteophytosis, worst from C5-C7. Upper chest: Negative. Other: None. IMPRESSION: 1. No acute intracranial pathology. 2. No displaced fractures or dislocations of the facial bones. 3. Soft tissue contusion  of the lips. 4. No fracture or static subluxation of the cervical spine. 5. 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 Electronically Signed   By: Jearld Lesch M.D.   On: 02/17/2023 15:25   CT Maxillofacial Wo Contrast  Result Date: 02/17/2023 CLINICAL DATA:  Fall, lip swelling, head and face pain EXAM: CT HEAD WITHOUT CONTRAST CT MAXILLOFACIAL WITHOUT CONTRAST CT CERVICAL SPINE WITHOUT CONTRAST TECHNIQUE: Multidetector CT imaging of the head, cervical spine, and maxillofacial structures were performed using the standard protocol without intravenous contrast. Multiplanar CT image reconstructions of the cervical spine and maxillofacial structures were also generated. RADIATION DOSE REDUCTION: This exam was performed according to the departmental dose-optimization program which includes automated exposure control, adjustment of the mA and/or kV according to patient size and/or use of iterative reconstruction technique. COMPARISON:  10/03/2020 FINDINGS: CT HEAD FINDINGS Brain: No  evidence of acute infarction, hemorrhage, hydrocephalus, extra-axial collection or mass lesion/mass effect. Mild periventricular white matter hypodensity. Vascular: No hyperdense vessel or unexpected calcification. CT FACIAL BONES FINDINGS Skull: Normal. Negative for fracture or focal lesion. Facial bones: No displaced fractures or dislocations. Sinuses/Orbits: No acute finding. Other: Soft tissue contusion of the lips (series 3, image 75). CT CERVICAL SPINE FINDINGS Alignment: Normal. Skull base and vertebrae: No acute fracture. No primary bone lesion or focal pathologic process. Soft tissues and spinal canal: No prevertebral fluid or swelling. No visible canal hematoma. Disc levels: Mild-to-moderate disc space height loss and osteophytosis, worst from C5-C7. Upper chest: Negative. Other: None. IMPRESSION: 1. No acute intracranial pathology. 2. No displaced fractures or dislocations of the facial bones. 3. Soft tissue contusion of the lips. 4. No fracture or static subluxation of the cervical spine. 5. 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 Electronically Signed   By: Jearld Lesch M.D.   On: 02/17/2023 15:25     EKG: Vent. rate 116 BPM PR interval * ms QRS duration 88 ms QT/QTcB 310/431 ms P-R-T axes * 104 -76 Atrial fibrillation Paired ventricular premature complexes Probable right ventricular hypertrophy Nonspecific T abnormalities, diffuse leads new afib   Assessment & Plan:    Principal Problem:   Seizure (HCC) Active Problems:   Hypokalemia   Hyponatremia   Thrombocytopenia (HCC)   Essential hypertension   Elevated LFTs   Hyperlipemia   GERD (gastroesophageal reflux disease)   Macrocytosis   Gout of multiple sites   Seizures -This is most likely due to alcohol abuse/withdrawals (given last drink was 2 days ago), as well due to multiple electrolyte derangements, as wife confirms he is compliant with medications -He was loaded with Keppra in ED, will increase his  Keppra to 1250 mg twice daily thereafter and on discharge as ED discussed with Dr. Darlyn Read from neurology  Left hip fracture Fall -Due to to fall, orthopedic consulted, plan for surgery when medically stable, I would anticipate this will take at least 1 to 2 days to have his heart rate controlled and to correct his electrolyte derangements -patient with significant right shoulder pain, will obtain x-ray as well  New onset A-fib with RVR -Rate uncontrolled, appears to be new onset A-fib, most likely due to severe electrolyte derangements, will replace, currently on Cardizem drip, will continue, will check TSH and echo. -He is not a candidate for anticoagulation given alcohol abuse, thrombocytopenia and falls  Alcohol abuse - Will keep in ICU CIWA protocol, continue with IV folic acid, thiamine unlikely to tolerate any oral currently  Significant lip and tongue bruising -from fall and tongue biting from seizures, will  keep n.p.o. for now  Severe hypokalemia -Will replace, monitor in telemetry  Severe hypomagnesemia -Will replace, monitor closely  Hyperglycemia -No history of diabetes, will monitor CBG and if elevated will start on insulin sliding scale, will check A1c   Hypophosphatemia -Replacing, monitor closely  Depression -hold Zoloft for now giving severe electrolyte derangement   Hyponatremia -Continue with IV fluids   Macrocytosis -Due to alcohol use, will check B12 level  Transaminitis -Due to alcohol use  GERD -Continue with PPI   DVT Prophylaxis SCDs   AM Labs Ordered, also please review Full Orders  Family Communication: Admission, patients condition and plan of care including tests being ordered have been discussed with the patient and wife at bedside who indicate understanding and agree with the plan and Code Status.  Code Status DNR, confirmed by patient  Likely DC to pending further workup, likely will need SNF  Consults called: Orthopedic consulted  by ED  Admission status: Inpatient  Time spent in minutes : 70 minutes   Huey Bienenstock M.D on 02/17/2023 at 5:26 PM   [facility] - Office  [phone]

## 2023-02-17 NOTE — ED Notes (Signed)
Second page sent to Dr Selina Cooley to for [facility] @ [phone]

## 2023-02-17 NOTE — ED Provider Notes (Signed)
[facility] AT [facility] Provider Note   CSN: [id number] Arrival date & time: 02/17/23  1156     History  Chief Complaint  Patient presents with   Seizures    Steven Bradley is a 77 y.o. male.   Seizures      Steven Bradley is a 77 y.o. male with past medical history of arthritis, GERD, seizures, hypertension, PTSD, and prostate cancer who presents to the Emergency Department for evaluation of fall and seizure.  History is provided by patient's spouse.  States he was in the bathroom of their RV this morning when she heard a loud noise.  Came into the bathroom and found him on the floor.  Incident occurred around 930.  She did not witness any shaking or seizure-like activity.  Spouse states he struck his face on the toilet.  Had bleeding swelling of his lower lip.  Spouse denies LOC.  EMS was called and patient refused transport.  Wife states that she assisted him to the couch when approximately 1 hour later he had a witnessed seizure that lasted approximately 7 to 8 minutes.  Seizure activity was witnessed by brother-in-law who describes generalized jerking and shaking.  Spouse states he has not had a seizure in at least 6 months.  She states he has not been feeling well for the past 2 days.  Continues to have normal appetite.  No vomiting or dysuria symptoms.  He is followed by neurology in [place].  She denies any missed doses of his seizure medication.  Currently, patient complains of pain of his mouth, headache, left knee pain and right elbow pain.  Patient recalls falling in the bathroom but does not recall any other events this morning.  Wife does note that he had episode of incontinence after the seizure.  Home Medications Prior to Admission medications   Medication Sig Start Date End Date Taking? Authorizing Provider  allopurinol (ZYLOPRIM) 100 MG tablet Take 100 mg by mouth daily. 06/01/22   [provider]  amLODipine (NORVASC) 5 MG  tablet Take 5 mg by mouth daily. 06/01/22   [provider]  capsaicin (ZOSTRIX) 0.025 % cream Apply 1 Application topically 2 (two) times daily as needed (skin). 09/27/22   [provider]  colchicine 0.6 MG tablet Take 0.6 mg by mouth daily as needed (gout). 05/08/20   [provider]  diclofenac Sodium (VOLTAREN) 1 % GEL Apply 2 g topically 3 (three) times daily as needed (Neck and hand pain). 09/27/22   [provider]  furosemide (LASIX) 40 MG tablet Take 40 mg by mouth daily.    [provider]  gabapentin (NEURONTIN) 100 MG capsule Take 100 mg by mouth 3 (three) times daily.    [provider]  levETIRAcetam (KEPPRA) 1000 MG tablet Take 1,000 mg by mouth 2 (two) times daily. 05/10/22   [provider]  loratadine (CLARITIN) 10 MG tablet Take 10 mg by mouth daily as needed for allergies. 05/08/20   [provider]  magnesium oxide (MAG-OX) 400 (241.3 Mg) MG tablet Take 400 mg by mouth daily.    [provider]  Multiple Vitamin (MULTIVITAMIN WITH MINERALS) TABS tablet Take 1 tablet by mouth daily.    [provider]  naproxen (NAPROSYN) 500 MG tablet Take 500 mg by mouth 2 (two) times daily with a meal.    [provider]  omeprazole (PRILOSEC) 20 MG capsule Take 2 capsules (40 mg total) by mouth 2 (  two) times daily before a meal. 12/24/18   Marinda Elk, MD  sertraline (ZOLOFT) 100 MG tablet Take 100 mg by mouth daily.    [provider]  tadalafil (CIALIS) 5 MG tablet Take 5 mg by mouth daily as needed for erectile dysfunction. 06/01/22   [provider]  Tetrahydroz-Dextran-PEG-Povid 0.05-0.1-1-1 % SOLN Place 1 drop into both eyes daily.    [provider]  traZODone (DESYREL) 100 MG tablet Take 100 mg by mouth at bedtime.    [provider]      Allergies    Viagra [sildenafil], Ciprofloxacin, Spironolactone, and Vitamin d analogs    Review of Systems    Review of Systems  Constitutional:  Negative for chills and fever.  HENT:  Negative for dental problem and trouble swallowing.          Swelling, laceration pain lower lip  Eyes:  Negative for visual disturbance.  Respiratory:  Negative for shortness of breath.   Cardiovascular:  Negative for chest pain.  Gastrointestinal:  Negative for abdominal pain, nausea and vomiting.  Genitourinary:  Negative for dysuria.  Musculoskeletal:  Positive for arthralgias (left knee pain, right elbow pain).  Neurological:  Positive for dizziness, seizures and headaches. Negative for syncope and speech difficulty.    Physical Exam Updated Vital Signs BP 124/81   Pulse 98   Temp (!) 97.1 F (36.2 C) (Oral)   Resp (!) 21   Ht 6\' 6"  (1.981 m)   Wt 87.1 kg   SpO2 98%   BMI 22.19 kg/m  Physical Exam Vitals and nursing note reviewed.  HENT:     Head:     Comments: Swelling with laceration of lower lip.  Abrasion lateral left tongue.    Mouth/Throat:     Mouth: Mucous membranes are moist. Lacerations present.     Dentition: No dental tenderness.     Pharynx: Uvula midline. No pharyngeal swelling or uvula swelling.     Comments: Bleeding laceration to mid lower lip.  Moderate edema of the lip noted.  Bruising as well.  No obvious dental tenderness or avulsions.  No malocclusion.  No trismus Eyes:     Extraocular Movements: Extraocular movements intact.     Conjunctiva/sclera: Conjunctivae normal.     Pupils: Pupils are equal, round, and reactive to light.  Cardiovascular:     Rate and Rhythm: Normal rate and regular rhythm.     Pulses: Normal pulses.  Pulmonary:     Effort: Pulmonary effort is normal. No respiratory distress.  Chest:     Chest wall: No tenderness.  Abdominal:     Palpations: Abdomen is soft.     Tenderness: There is no abdominal tenderness.  Musculoskeletal:        General: Tenderness present.     Cervical back: Normal range of motion. No tenderness.     Comments: Tender  anterior left knee.  No excessive warmth or erythema.  Pain with range of motion of the knee.  Skin:    General: Skin is warm.     Capillary Refill: Capillary refill takes less than 2 seconds.  Neurological:     Mental Status: He is alert.     GCS: GCS eye subscore is 4. GCS verbal subscore is 5. GCS motor subscore is 6.     Sensory: Sensation is intact. No sensory deficit.     Motor: Motor function is intact. No weakness or seizure activity.     Coordination: Coordination normal.  Comments: Patient alert oriented to person and time.  Mentating well.  Does not appear postictal.     ED Results / Procedures / Treatments   Labs (all labs ordered are listed, but only abnormal results are displayed) Labs Reviewed  COMPREHENSIVE METABOLIC PANEL - Abnormal; Notable for the following components:      Result Value   Sodium 133 (*)    Potassium 2.5 (*)    Chloride 94 (*)    CO2 20 (*)    Glucose, Bld 198 (*)    Calcium 8.3 (*)    Total Protein 8.8 (*)    AST 247 (*)    ALT 74 (*)    Alkaline Phosphatase 186 (*)    Total Bilirubin 3.5 (*)    Anion gap 19 (*)    All other components within normal limits  CBC WITH DIFFERENTIAL/PLATELET - Abnormal; Notable for the following components:   MCV 100.5 (*)    MCH 34.2 (*)    Platelets 99 (*)    Neutro Abs 8.3 (*)    Abs Immature Granulocytes 0.11 (*)    All other components within normal limits  MAGNESIUM - Abnormal; Notable for the following components:   Magnesium 0.8 (*)    All other components within normal limits  PHOSPHORUS - Abnormal; Notable for the following components:   Phosphorus 2.2 (*)    All other components within normal limits  CBG MONITORING, ED - Abnormal; Notable for the following components:   Glucose-Capillary 183 (*)    All other components within normal limits  URINALYSIS, ROUTINE W REFLEX MICROSCOPIC  LEVETIRACETAM LEVEL  ETHANOL    EKG EKG Interpretation Date/Time:  Thursday February 17 2023 15:34:04  EST Ventricular Rate:  116 PR Interval:  141 QRS Duration:  88 QT Interval:  310 QTC Calculation: 431 R Axis:   104  Text Interpretation: Atrial fibrillation Paired ventricular premature complexes Probable right ventricular hypertrophy Nonspecific T abnormalities, diffuse leads new afib Confirmed by Meridee Score [phone]) on 02/17/2023 3:44:21 PM  Radiology CT Head Wo Contrast  Result Date: 02/17/2023 CLINICAL DATA:  Fall, lip swelling, head and face pain EXAM: CT HEAD WITHOUT CONTRAST CT MAXILLOFACIAL WITHOUT CONTRAST CT CERVICAL SPINE WITHOUT CONTRAST TECHNIQUE: Multidetector CT imaging of the head, cervical spine, and maxillofacial structures were performed using the standard protocol without intravenous contrast. Multiplanar CT image reconstructions of the cervical spine and maxillofacial structures were also generated. RADIATION DOSE REDUCTION: This exam was performed according to the departmental dose-optimization program which includes automated exposure control, adjustment of the mA and/or kV according to patient size and/or use of iterative reconstruction technique. COMPARISON:  10/03/2020 FINDINGS: CT HEAD FINDINGS Brain: No evidence of acute infarction, hemorrhage, hydrocephalus, extra-axial collection or mass lesion/mass effect. Mild periventricular white matter hypodensity. Vascular: No hyperdense vessel or unexpected calcification. CT FACIAL BONES FINDINGS Skull: Normal. Negative for fracture or focal lesion. Facial bones: No displaced fractures or dislocations. Sinuses/Orbits: No acute finding. Other: Soft tissue contusion of the lips (series 3, image 75). CT CERVICAL SPINE FINDINGS Alignment: Normal. Skull base and vertebrae: No acute fracture. No primary bone lesion or focal pathologic process. Soft tissues and spinal canal: No prevertebral fluid or swelling. No visible canal hematoma. Disc levels: Mild-to-moderate disc space height loss and osteophytosis, worst from C5-C7. Upper chest:  Negative. Other: None. IMPRESSION: 1. No acute intracranial pathology. 2. No displaced fractures or dislocations of the facial bones. 3. Soft tissue contusion of the lips. 4. No fracture or static subluxation of  the cervical spine. 5. 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 Electronically Signed   By: Jearld Lesch M.D.   On: 02/17/2023 15:25   CT Cervical Spine Wo Contrast  Result Date: 02/17/2023 CLINICAL DATA:  Fall, lip swelling, head and face pain EXAM: CT HEAD WITHOUT CONTRAST CT MAXILLOFACIAL WITHOUT CONTRAST CT CERVICAL SPINE WITHOUT CONTRAST TECHNIQUE: Multidetector CT imaging of the head, cervical spine, and maxillofacial structures were performed using the standard protocol without intravenous contrast. Multiplanar CT image reconstructions of the cervical spine and maxillofacial structures were also generated. RADIATION DOSE REDUCTION: This exam was performed according to the departmental dose-optimization program which includes automated exposure control, adjustment of the mA and/or kV according to patient size and/or use of iterative reconstruction technique. COMPARISON:  10/03/2020 FINDINGS: CT HEAD FINDINGS Brain: No evidence of acute infarction, hemorrhage, hydrocephalus, extra-axial collection or mass lesion/mass effect. Mild periventricular white matter hypodensity. Vascular: No hyperdense vessel or unexpected calcification. CT FACIAL BONES FINDINGS Skull: Normal. Negative for fracture or focal lesion. Facial bones: No displaced fractures or dislocations. Sinuses/Orbits: No acute finding. Other: Soft tissue contusion of the lips (series 3, image 75). CT CERVICAL SPINE FINDINGS Alignment: Normal. Skull base and vertebrae: No acute fracture. No primary bone lesion or focal pathologic process. Soft tissues and spinal canal: No prevertebral fluid or swelling. No visible canal hematoma. Disc levels: Mild-to-moderate disc space height loss and osteophytosis, worst from C5-C7. Upper  chest: Negative. Other: None. IMPRESSION: 1. No acute intracranial pathology. 2. No displaced fractures or dislocations of the facial bones. 3. Soft tissue contusion of the lips. 4. No fracture or static subluxation of the cervical spine. 5. 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 Electronically Signed   By: Jearld Lesch M.D.   On: 02/17/2023 15:25   CT Maxillofacial Wo Contrast  Result Date: 02/17/2023 CLINICAL DATA:  Fall, lip swelling, head and face pain EXAM: CT HEAD WITHOUT CONTRAST CT MAXILLOFACIAL WITHOUT CONTRAST CT CERVICAL SPINE WITHOUT CONTRAST TECHNIQUE: Multidetector CT imaging of the head, cervical spine, and maxillofacial structures were performed using the standard protocol without intravenous contrast. Multiplanar CT image reconstructions of the cervical spine and maxillofacial structures were also generated. RADIATION DOSE REDUCTION: This exam was performed according to the departmental dose-optimization program which includes automated exposure control, adjustment of the mA and/or kV according to patient size and/or use of iterative reconstruction technique. COMPARISON:  10/03/2020 FINDINGS: CT HEAD FINDINGS Brain: No evidence of acute infarction, hemorrhage, hydrocephalus, extra-axial collection or mass lesion/mass effect. Mild periventricular white matter hypodensity. Vascular: No hyperdense vessel or unexpected calcification. CT FACIAL BONES FINDINGS Skull: Normal. Negative for fracture or focal lesion. Facial bones: No displaced fractures or dislocations. Sinuses/Orbits: No acute finding. Other: Soft tissue contusion of the lips (series 3, image 75). CT CERVICAL SPINE FINDINGS Alignment: Normal. Skull base and vertebrae: No acute fracture. No primary bone lesion or focal pathologic process. Soft tissues and spinal canal: No prevertebral fluid or swelling. No visible canal hematoma. Disc levels: Mild-to-moderate disc space height loss and osteophytosis, worst from C5-C7. Upper  chest: Negative. Other: None. IMPRESSION: 1. No acute intracranial pathology. 2. No displaced fractures or dislocations of the facial bones. 3. Soft tissue contusion of the lips. 4. No fracture or static subluxation of the cervical spine. 5. 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 Electronically Signed   By: Jearld Lesch M.D.   On: 02/17/2023 15:25    Procedures Procedures    CRITICAL CARE Performed by: Elfrieda Espino Total critical care time: 45 minutes Critical care time was  exclusive of separately billable procedures and treating other patients. Critical care was necessary to treat or prevent imminent or life-threatening deterioration. Critical care was time spent personally by me on the following activities: development of treatment plan with patient and/or surrogate as well as nursing, discussions with consultants, evaluation of patient's response to treatment, examination of patient, obtaining history from patient or surrogate, ordering and performing treatments and interventions, ordering and review of laboratory studies, ordering and review of radiographic studies, pulse oximetry and re-evaluation of patient's condition.  Critical care time for patient significant hypokalemia, hypomagnesemia, breakthrough seizure and new onset A-fib.  Patient is required IV Cardizem bolus and drip for rate control   Medications Ordered in ED Medications  potassium chloride 10 mEq in 100 mL IVPB (10 mEq Intravenous New Bag/Given 02/17/23 1601)  potassium chloride SA (KLOR-CON M) CR tablet 40 mEq ( Oral Canceled Entry 02/17/23 1543)  diltiazem (CARDIZEM) 1 mg/mL load via infusion 10 mg (10 mg Intravenous Bolus from Bag 02/17/23 1604)    And  diltiazem (CARDIZEM) 125 mg in dextrose 5% 125 mL (1 mg/mL) infusion (10 mg/hr Intravenous Rate/Dose Change 02/17/23 1633)  magnesium sulfate IVPB 2 g 50 mL (2 g Intravenous New Bag/Given 02/17/23 1626)  levETIRAcetam (KEPPRA) 1,740 mg in sodium chloride  0.9 % 100 mL IVPB (has no administration in time range)    ED Course/ Medical Decision Making/ A&P                                 Medical Decision Making Patient here for evaluation of injury sustained after a fall this morning around 930.  Approximately 1 hour later, patient had a witnessed seizure by family member that lasted approximately 7 to 8 minutes.  Patient has history of seizures takes Keppra daily, denies any missed doses.  Last seizure approximately 6 months ago.  Followed by neurology in [place]. Patient was noted to be incontinent of urine and has injuries of his mouth that were reported to have occurred from his fall from earlier today.  It is unclear if patient had unwitnessed seizure that precipitated his fall from this morning.  Patient with facial injuries complains of headache.  Skull fracture, concussion, postictal state, subdural hematoma bony injury all considered.  Amount and/or Complexity of Data Reviewed Labs: ordered.    Details: Labs interpreted by me no evidence of leukocytosis, hemoglobin unremarkable.  Chemistries show significant hypokalemia with potassium of 2.5.  LFTs and total bili elevated, but patient has had elevated LFTs in the past.  Phosphorus is low at 2.2.  Significant hypomagnesemia with magnesium level of 0.8 Radiology: ordered.    Details: CT head, C-spine and maxillofacial without evidence of fracture or intracranial injury.  No facial bone fractures.  Plain film x-rays of the left knee, and chest x-ray without acute finding  Plain film of the pelvis shows left femoral neck fracture.  Dedicated hip film ordered, results are pending ECG/medicine tests: ordered.    Details: EKG shows sinus tachycardia with multiple premature complexes ventricular and supraventricular with right axis deviation.  Patient is second EKG while here after episode of tachycardia.  EKG shows atrial fibrillation.  With ventricular premature complexes and probable right  ventricular hypertrophy.  Nonspecific T wave abnormalities Discussion of management or test interpretation with external provider(s): On recheck, patient resting comfortably.  Appears more alert at this time.  Bleeding of his mouth has resolved.  Continues to have  significant edema of the lower lip.  Consulted with cardiology regarding his new onset A-fib.  Recommended rate control only given setting of recent seizure and fall does not recommend anticoagulation at this time.  Rate improving after Cardizem bolus and drip no chest pain.  Consulted with neurology, [name] who agrees with addressing his hypomagnesemia and recommends Keppra with loading dose of 20 mg/kg and also recommends increasing his routine doses to 1250 twice daily.  Agreeable for hospital admit here at [facility] no indication for repeat EEG at this time.  Patient also found to have femoral neck fracture.  Consulted with orthopedics, Dr. Romeo Apple, agreeable with hip film and will follow-up, possible surgery if patient cleared medically.  Risk Prescription drug management.           Final Clinical Impression(s) / ED Diagnoses Final diagnoses:  Seizures (HCC)  Hypomagnesemia  New onset a-fib [facility])    Rx / DC Orders ED Discharge Orders     None         Pauline Aus, PA-C 02/17/23 1722    Pricilla Loveless, MD 02/23/23 [phone]

## 2023-02-18 ENCOUNTER — Other Ambulatory Visit (HOSPITAL_COMMUNITY): Payer: Self-pay | Admitting: *Deleted

## 2023-02-18 ENCOUNTER — Encounter (HOSPITAL_COMMUNITY): Payer: Self-pay | Admitting: Internal Medicine

## 2023-02-18 ENCOUNTER — Other Ambulatory Visit (HOSPITAL_COMMUNITY): Payer: No Typology Code available for payment source

## 2023-02-18 ENCOUNTER — Inpatient Hospital Stay (HOSPITAL_COMMUNITY): Payer: No Typology Code available for payment source

## 2023-02-18 DIAGNOSIS — D7589 Other specified diseases of blood and blood-forming organs: Secondary | ICD-10-CM

## 2023-02-18 DIAGNOSIS — I4891 Unspecified atrial fibrillation: Secondary | ICD-10-CM | POA: Diagnosis not present

## 2023-02-18 DIAGNOSIS — E871 Hypo-osmolality and hyponatremia: Secondary | ICD-10-CM

## 2023-02-18 DIAGNOSIS — R569 Unspecified convulsions: Secondary | ICD-10-CM | POA: Diagnosis not present

## 2023-02-18 DIAGNOSIS — R55 Syncope and collapse: Secondary | ICD-10-CM

## 2023-02-18 DIAGNOSIS — K219 Gastro-esophageal reflux disease without esophagitis: Secondary | ICD-10-CM

## 2023-02-18 DIAGNOSIS — E876 Hypokalemia: Secondary | ICD-10-CM | POA: Diagnosis not present

## 2023-02-18 DIAGNOSIS — R7989 Other specified abnormal findings of blood chemistry: Secondary | ICD-10-CM | POA: Diagnosis not present

## 2023-02-18 DIAGNOSIS — D696 Thrombocytopenia, unspecified: Secondary | ICD-10-CM

## 2023-02-18 LAB — URINALYSIS, ROUTINE W REFLEX MICROSCOPIC
Bacteria, UA: NONE SEEN
Bilirubin Urine: NEGATIVE
Glucose, UA: NEGATIVE mg/dL
Ketones, ur: NEGATIVE mg/dL
Leukocytes,Ua: NEGATIVE
Nitrite: NEGATIVE
Protein, ur: >=300 mg/dL — AB
RBC / HPF: >50 RBC/hpf (ref 0–5)
Specific Gravity, Urine: 1.033 — ABNORMAL HIGH (ref 1.005–1.030)
pH: 5 (ref 5.0–8.0)

## 2023-02-18 LAB — COMPREHENSIVE METABOLIC PANEL
ALT: 58 U/L — ABNORMAL HIGH (ref 0–44)
AST: 117 U/L — ABNORMAL HIGH (ref 15–41)
Albumin: 3 g/dL — ABNORMAL LOW (ref 3.5–5.0)
Alkaline Phosphatase: 133 U/L — ABNORMAL HIGH (ref 38–126)
Anion gap: 12 (ref 5–15)
BUN: 18 mg/dL (ref 8–23)
CO2: 22 mmol/L (ref 22–32)
Calcium: 7.8 mg/dL — ABNORMAL LOW (ref 8.9–10.3)
Chloride: 96 mmol/L — ABNORMAL LOW (ref 98–111)
Creatinine, Ser: 1.13 mg/dL (ref 0.61–1.24)
GFR, Estimated: >60 mL/min (ref >60)
Glucose, Bld: 127 mg/dL — ABNORMAL HIGH (ref 70–99)
Potassium: 3.5 mmol/L (ref 3.5–5.1)
Sodium: 130 mmol/L — ABNORMAL LOW (ref 135–145)
Total Bilirubin: 2.4 mg/dL — ABNORMAL HIGH (ref <1.2)
Total Protein: 7.1 g/dL (ref 6.5–8.1)

## 2023-02-18 LAB — ECHOCARDIOGRAM COMPLETE
Area-P 1/2: 3.77 cm2
Calc EF: 61.8 %
Height: 78 in
S' Lateral: 2.5 cm
Single Plane A2C EF: 67.6 %
Single Plane A4C EF: 54.2 %
Weight: 3350.99 [oz_av]

## 2023-02-18 LAB — CBC
HCT: 32.8 % — ABNORMAL LOW (ref 39.0–52.0)
Hemoglobin: 11.3 g/dL — ABNORMAL LOW (ref 13.0–17.0)
MCH: 34.3 pg — ABNORMAL HIGH (ref 26.0–34.0)
MCHC: 34.5 g/dL (ref 30.0–36.0)
MCV: 99.7 fL (ref 80.0–100.0)
Platelets: 89 10*3/uL — ABNORMAL LOW (ref 150–400)
RBC: 3.29 MIL/uL — ABNORMAL LOW (ref 4.22–5.81)
RDW: 13.2 % (ref 11.5–15.5)
WBC: 12.3 10*3/uL — ABNORMAL HIGH (ref 4.0–10.5)
nRBC: 0 % (ref 0.0–0.2)

## 2023-02-18 LAB — BASIC METABOLIC PANEL
Anion gap: 12 (ref 5–15)
BUN: 15 mg/dL (ref 8–23)
CO2: 23 mmol/L (ref 22–32)
Calcium: 7.9 mg/dL — ABNORMAL LOW (ref 8.9–10.3)
Chloride: 96 mmol/L — ABNORMAL LOW (ref 98–111)
Creatinine, Ser: 0.92 mg/dL (ref 0.61–1.24)
GFR, Estimated: >60 mL/min (ref >60)
Glucose, Bld: 135 mg/dL — ABNORMAL HIGH (ref 70–99)
Potassium: 3.9 mmol/L (ref 3.5–5.1)
Sodium: 131 mmol/L — ABNORMAL LOW (ref 135–145)

## 2023-02-18 LAB — MAGNESIUM
Magnesium: 1.6 mg/dL — ABNORMAL LOW (ref 1.7–2.4)
Magnesium: 2.2 mg/dL (ref 1.7–2.4)

## 2023-02-18 LAB — HEMOGLOBIN A1C
Hgb A1c MFr Bld: 4.8 % (ref 4.8–5.6)
Mean Plasma Glucose: 91.06 mg/dL

## 2023-02-18 LAB — VITAMIN B12: Vitamin B-12: 208 pg/mL (ref 180–914)

## 2023-02-18 LAB — FOLATE: Folate: 10.8 ng/mL (ref >5.9)

## 2023-02-18 LAB — LEVETIRACETAM LEVEL: Levetiracetam Lvl: 18.3 ug/mL (ref 10.0–40.0)

## 2023-02-18 LAB — PHOSPHORUS: Phosphorus: 4.3 mg/dL (ref 2.5–4.6)

## 2023-02-18 MED ORDER — THIAMINE MONONITRATE 100 MG PO TABS
100.0000 mg | ORAL_TABLET | Freq: Every day | ORAL | Status: DC
  Administered 2023-02-19 – 2023-02-25 (×5): 100 mg via ORAL
  Filled 2023-02-18 (×6): qty 1

## 2023-02-18 MED ORDER — TRAZODONE HCL 100 MG PO TABS: 100.0000 mg | ORAL_TABLET | Freq: Every day | ORAL | Status: DC

## 2023-02-18 MED ORDER — OXYCODONE HCL 5 MG PO TABS
5.0000 mg | ORAL_TABLET | ORAL | Status: DC | PRN
  Administered 2023-02-18 (×2): 5 mg via ORAL
  Filled 2023-02-18 (×2): qty 1

## 2023-02-18 MED ORDER — DILTIAZEM HCL 30 MG PO TABS
30.0000 mg | ORAL_TABLET | Freq: Four times a day (QID) | ORAL | Status: DC
  Administered 2023-02-18 – 2023-02-19 (×6): 30 mg via ORAL
  Filled 2023-02-18 (×7): qty 1

## 2023-02-18 MED ORDER — MAGNESIUM SULFATE 2 GM/50ML IV SOLN
2.0000 g | Freq: Once | INTRAVENOUS | Status: AC
  Administered 2023-02-18: 2 g via INTRAVENOUS
  Filled 2023-02-18: qty 50

## 2023-02-18 MED ORDER — CHLORDIAZEPOXIDE HCL 5 MG PO CAPS
5.0000 mg | ORAL_CAPSULE | Freq: Three times a day (TID) | ORAL | Status: DC
  Filled 2023-02-18: qty 1

## 2023-02-18 MED ORDER — LEVETIRACETAM 500 MG PO TABS
1250.0000 mg | ORAL_TABLET | Freq: Two times a day (BID) | ORAL | Status: DC
  Administered 2023-02-18 – 2023-02-25 (×13): 1250 mg via ORAL
  Filled 2023-02-18 (×14): qty 1

## 2023-02-18 MED ORDER — ADULT MULTIVITAMIN W/MINERALS CH
1.0000 | ORAL_TABLET | Freq: Every day | ORAL | Status: DC
  Administered 2023-02-18 – 2023-02-25 (×7): 1 via ORAL
  Filled 2023-02-18 (×7): qty 1

## 2023-02-18 MED ORDER — SERTRALINE HCL 100 MG PO TABS
100.0000 mg | ORAL_TABLET | Freq: Every day | ORAL | Status: DC
  Administered 2023-02-18: 100 mg via ORAL
  Filled 2023-02-18: qty 2

## 2023-02-18 MED ORDER — CHLORDIAZEPOXIDE HCL 5 MG PO CAPS
10.0000 mg | ORAL_CAPSULE | Freq: Three times a day (TID) | ORAL | Status: DC
Start: 2023-02-20 — End: 2023-02-22
  Administered 2023-02-20 – 2023-02-22 (×3): 10 mg via ORAL
  Filled 2023-02-18 (×3): qty 2

## 2023-02-18 MED ORDER — CHLORDIAZEPOXIDE HCL 5 MG PO CAPS
25.0000 mg | ORAL_CAPSULE | Freq: Three times a day (TID) | ORAL | Status: DC
  Administered 2023-02-18 – 2023-02-19 (×5): 25 mg via ORAL
  Filled 2023-02-18 (×5): qty 5

## 2023-02-18 MED ORDER — POTASSIUM CHLORIDE IN NACL 20-0.9 MEQ/L-% IV SOLN
INTRAVENOUS | Status: DC
  Filled 2023-02-18: qty 1000

## 2023-02-18 MED ORDER — ALLOPURINOL 100 MG PO TABS
100.0000 mg | ORAL_TABLET | Freq: Every day | ORAL | Status: DC
  Administered 2023-02-18 – 2023-02-25 (×7): 100 mg via ORAL
  Filled 2023-02-18 (×7): qty 1

## 2023-02-18 MED ORDER — SODIUM CHLORIDE 0.9 % IV SOLN
500.0000 mg | INTRAVENOUS | Status: AC
  Administered 2023-02-18: 500 mg via INTRAVENOUS
  Filled 2023-02-18 (×2): qty 5

## 2023-02-18 MED ORDER — BOOST / RESOURCE BREEZE PO LIQD CUSTOM
1.0000 | Freq: Three times a day (TID) | ORAL | Status: DC
  Administered 2023-02-18: 237 mL via ORAL
  Administered 2023-02-18 – 2023-02-25 (×11): 1 via ORAL

## 2023-02-18 NOTE — Consult Note (Signed)
Reason for Consult: Left hip and right shoulder pain Referring Physician: Dr. Gomez Cleverly is an 77 y.o. male.  HPI: 77 year old male with past medical history significant for alcohol abuse, new onset atrial fibrillation and seizure disorder complains of left hip and right shoulder pain since a fall in his RV yesterday morning.  After he fell he was noted to have some facial bruising.  His wife reports that he continued to hurt at the left hip and right shoulder and presented to the emergency room at [facility].  Radiographs revealed a left hip fracture and right proximal humerus fracture.  He also had a seizure after the fall.  He was stabilized upon admission.  He was transferred to [facility] for management of his orthopedic injuries due to his medical complexity.  He is not a smoker.  His wife reports that he has several drinks daily.  He does not take blood thinners.  Past Medical History:  Diagnosis Date   Arthritis    Enlarged prostate    GERD (gastroesophageal reflux disease)    Gout    Patient doesn't recall having gout but was prescribed allopurinol for ankle swelling.   Hyperlipidemia    Hypertension    Prostate cancer [facility]) June 2015   PTSD (post-traumatic stress disorder)    Syncope    Negative outpatient workup.   Vitamin D deficiency     Past Surgical History:  Procedure Laterality Date   PROSTATE BIOPSY  June 2015   PROSTATE SURGERY     Right hand surgery     Scrapnel removal from scalp     During [place] war    Family History  Problem Relation Age of Onset   Cancer Father     Social History:  reports that he has never smoked. He has never used smokeless tobacco. He reports current alcohol use. He reports that he does not use drugs.  Allergies:  Allergies  Allergen Reactions   Viagra [Sildenafil] Nausea And Vomiting   Ciprofloxacin Rash   Spironolactone Rash   Vitamin D Analogs Rash    Medications: I have reviewed the patient's current  medications.  Results for orders placed or performed during the hospital encounter of 02/17/23 (from the past 48 hour(s))  Comprehensive metabolic panel     Status: Abnormal   Collection Time: 02/17/23  1:30 PM  Result Value Ref Range   Sodium 133 (L) 135 - 145 mmol/L   Potassium 2.5 (LL) 3.5 - 5.1 mmol/L    Comment: CRITICAL RESULT CALLED TO, READ BACK BY AND VERIFIED WITH FOWLER,D ON 02/17/23 AT 1516 BY LOY,C   Chloride 94 (L) 98 - 111 mmol/L   CO2 20 (L) 22 - 32 mmol/L   Glucose, Bld 198 (H) 70 - 99 mg/dL    Comment: Glucose reference range applies only to samples taken after fasting for at least 8 hours.   BUN 13 8 - 23 mg/dL   Creatinine, Ser 5.28 0.61 - 1.24 mg/dL   Calcium 8.3 (L) 8.9 - 10.3 mg/dL   Total Protein 8.8 (H) 6.5 - 8.1 g/dL   Albumin 3.8 3.5 - 5.0 g/dL   AST 413 (H) 15 - 41 U/L   ALT 74 (H) 0 - 44 U/L   Alkaline Phosphatase 186 (H) 38 - 126 U/L   Total Bilirubin 3.5 (H) <1.2 mg/dL   GFR, Estimated >24 >40 mL/min    Comment: (NOTE) Calculated using the CKD-EPI Creatinine Equation (2021)    Anion  gap 19 (H) 5 - 15    Comment: Performed at [facility], [address] [place], [place] [address]  CBC with Differential/Platelet     Status: Abnormal   Collection Time: 02/17/23  1:30 PM  Result Value Ref Range   WBC 9.9 4.0 - 10.5 K/uL   RBC 4.27 4.22 - 5.81 MIL/uL   Hemoglobin 14.6 13.0 - 17.0 g/dL   HCT 65.7 84.6 - 96.2 %   MCV 100.5 (H) 80.0 - 100.0 fL   MCH 34.2 (H) 26.0 - 34.0 pg   MCHC 34.0 30.0 - 36.0 g/dL   RDW 95.2 84.1 - 32.4 %   Platelets 99 (L) 150 - 400 K/uL    Comment: SPECIMEN CHECKED FOR CLOTS Immature Platelet Fraction may be clinically indicated, consider ordering this additional test [id number] REPEATED TO VERIFY PLATELET COUNT CONFIRMED BY SMEAR    nRBC 0.2 0.0 - 0.2 %   Neutrophils Relative % 85 %   Neutro Abs 8.3 (H) 1.7 - 7.7 K/uL   Lymphocytes Relative 7 %   Lymphs Abs 0.7 0.7 - 4.0 K/uL   Monocytes Relative 7 %   Monocytes  Absolute 0.6 0.1 - 1.0 K/uL   Eosinophils Relative 0 %   Eosinophils Absolute 0.0 0.0 - 0.5 K/uL   Basophils Relative 0 %   Basophils Absolute 0.0 0.0 - 0.1 K/uL   WBC Morphology MORPHOLOGY UNREMARKABLE    RBC Morphology See Note     Comment: MACROCYTOSIS   Smear Review MORPHOLOGY UNREMARKABLE    Immature Granulocytes 1 %   Abs Immature Granulocytes 0.11 (H) 0.00 - 0.07 K/uL    Comment: Performed at [facility], [address], [place], [place] [address]  Magnesium     Status: Abnormal   Collection Time: 02/17/23  1:30 PM  Result Value Ref Range   Magnesium 0.8 (LL) 1.7 - 2.4 mg/dL    Comment: CRITICAL RESULT CALLED TO, READ BACK BY AND VERIFIED WITH FOWLER,D ON 02/17/23 AT 1601 BY LOY,C Performed at [facility], [address] [place], [place] [address]   Phosphorus     Status: Abnormal   Collection Time: 02/17/23  1:30 PM  Result Value Ref Range   Phosphorus 2.2 (L) 2.5 - 4.6 mg/dL    Comment: Performed at [facility], [address] [place], [place] [address]  Levetiracetam level     Status: None   Collection Time: 02/17/23  3:15 PM  Result Value Ref Range   Levetiracetam Lvl 18.3 10.0 - 40.0 ug/mL    Comment: (NOTE) Performed At: [facility] [address] [place], [place] [id number] Jolene Schimke MD RJ:[id number]   CBG monitoring, ED     Status: Abnormal   Collection Time: 02/17/23  3:35 PM  Result Value Ref Range   Glucose-Capillary 183 (H) 70 - 99 mg/dL    Comment: Glucose reference range applies only to samples taken after fasting for at least 8 hours.  Ethanol     Status: None   Collection Time: 02/17/23  4:29 PM  Result Value Ref Range   Alcohol, Ethyl (B) <10 <10 mg/dL    Comment: (NOTE) Lowest detectable limit for serum alcohol is 10 mg/dL.  For medical purposes only. Performed at [facility], [address], [place], [place] [address]   TSH     Status: None   Collection Time: 02/17/23  4:29 PM  Result Value Ref Range   TSH 2.421 0.350 - 4.500  uIU/mL    Comment: Performed by  a 3rd Generation assay with a functional sensitivity of <=0.01 uIU/mL. Performed at [facility], [address], [place], [place] [address]   MRSA Next Gen by PCR, Nasal     Status: None   Collection Time: 02/17/23  5:12 PM   Specimen: Nasal Mucosa; Nasal Swab  Result Value Ref Range   MRSA by PCR Next Gen NOT DETECTED NOT DETECTED    Comment: (NOTE) The GeneXpert MRSA Assay (FDA approved for NASAL specimens only), is one component of a comprehensive MRSA colonization surveillance program. It is not intended to diagnose MRSA infection nor to guide or monitor treatment for MRSA infections. Test performance is not FDA approved in patients less than 50 years old. Performed at [facility], [address], [place], [place] [address]   Basic metabolic panel     Status: Abnormal   Collection Time: 02/17/23 11:25 PM  Result Value Ref Range   Sodium 131 (L) 135 - 145 mmol/L   Potassium 3.9 3.5 - 5.1 mmol/L   Chloride 96 (L) 98 - 111 mmol/L   CO2 23 22 - 32 mmol/L   Glucose, Bld 135 (H) 70 - 99 mg/dL    Comment: Glucose reference range applies only to samples taken after fasting for at least 8 hours.   BUN 15 8 - 23 mg/dL   Creatinine, Ser 1.30 0.61 - 1.24 mg/dL   Calcium 7.9 (L) 8.9 - 10.3 mg/dL   GFR, Estimated >86 >57 mL/min    Comment: (NOTE) Calculated using the CKD-EPI Creatinine Equation (2021)    Anion gap 12 5 - 15    Comment: Performed at [facility], [address] [place], [place] [address]  Magnesium     Status: Abnormal   Collection Time: 02/17/23 11:25 PM  Result Value Ref Range   Magnesium 1.6 (L) 1.7 - 2.4 mg/dL    Comment: Performed at [facility], [address] [place], [place] [address]  CBC     Status: Abnormal   Collection Time: 02/18/23  4:46 AM  Result Value Ref Range   WBC 12.3 (H) 4.0 - 10.5 K/uL   RBC 3.29 (L) 4.22 - 5.81 MIL/uL   Hemoglobin 11.3 (L) 13.0 - 17.0 g/dL   HCT 41.3 (L) 24.4 - 01.0 %   MCV 99.7 80.0 - 100.0  fL   MCH 34.3 (H) 26.0 - 34.0 pg   MCHC 34.5 30.0 - 36.0 g/dL   RDW 27.2 53.6 - 64.4 %   Platelets 89 (L) 150 - 400 K/uL    Comment: SPECIMEN CHECKED FOR CLOTS Immature Platelet Fraction may be clinically indicated, consider ordering this additional test [id number] REPEATED TO VERIFY PLATELET COUNT CONFIRMED BY SMEAR    nRBC 0.0 0.0 - 0.2 %    Comment: Performed at [facility], [address] [place], [place] [address]  Phosphorus     Status: None   Collection Time: 02/18/23  4:46 AM  Result Value Ref Range   Phosphorus 4.3 2.5 - 4.6 mg/dL    Comment: Performed at [facility], [address] [place], [place] [address]  Magnesium     Status: None   Collection Time: 02/18/23  4:46 AM  Result Value Ref Range   Magnesium 2.2 1.7 - 2.4 mg/dL    Comment: Performed at [facility], [address], [place], [place] [address]  Comprehensive metabolic panel     Status: Abnormal   Collection Time: 02/18/23  4:46 AM  Result Value Ref Range   Sodium 130 (  L) 135 - 145 mmol/L   Potassium 3.5 3.5 - 5.1 mmol/L   Chloride 96 (L) 98 - 111 mmol/L   CO2 22 22 - 32 mmol/L   Glucose, Bld 127 (H) 70 - 99 mg/dL    Comment: Glucose reference range applies only to samples taken after fasting for at least 8 hours.   BUN 18 8 - 23 mg/dL   Creatinine, Ser 6.57 0.61 - 1.24 mg/dL   Calcium 7.8 (L) 8.9 - 10.3 mg/dL   Total Protein 7.1 6.5 - 8.1 g/dL   Albumin 3.0 (L) 3.5 - 5.0 g/dL   AST 846 (H) 15 - 41 U/L   ALT 58 (H) 0 - 44 U/L   Alkaline Phosphatase 133 (H) 38 - 126 U/L   Total Bilirubin 2.4 (H) <1.2 mg/dL   GFR, Estimated >96 >29 mL/min    Comment: (NOTE) Calculated using the CKD-EPI Creatinine Equation (2021)    Anion gap 12 5 - 15    Comment: Performed at [facility], [address], [place], [place] [address]  Vitamin B12     Status: None   Collection Time: 02/18/23  4:46 AM  Result Value Ref Range   Vitamin B-12 208 180 - 914 pg/mL    Comment: (NOTE) This assay is not validated for  testing neonatal or myeloproliferative syndrome specimens for Vitamin B12 levels. Performed at [facility], [address], [place], [place] [address]   Folate     Status: None   Collection Time: 02/18/23  4:46 AM  Result Value Ref Range   Folate 10.8 >5.9 ng/mL    Comment: Performed at [facility], [address] [place], [place] [address]  Hemoglobin A1c     Status: None   Collection Time: 02/18/23  4:46 AM  Result Value Ref Range   Hgb A1c MFr Bld 4.8 4.8 - 5.6 %    Comment: (NOTE) Pre diabetes:          5.7%-6.4%  Diabetes:              >6.4%  Glycemic control for   <7.0% adults with diabetes    Mean Plasma Glucose 91.06 mg/dL    Comment: Performed at [facility], [address], [place], [place] [address]  Urinalysis, Routine w reflex microscopic -Urine, Clean Catch     Status: Abnormal   Collection Time: 02/18/23  7:30 PM  Result Value Ref Range   Color, Urine AMBER (A) YELLOW    Comment: BIOCHEMICALS MAY BE AFFECTED BY COLOR   APPearance HAZY (A) CLEAR   Specific Gravity, Urine 1.033 (H) 1.005 - 1.030   pH 5.0 5.0 - 8.0   Glucose, UA NEGATIVE NEGATIVE mg/dL   Hgb urine dipstick LARGE (A) NEGATIVE   Bilirubin Urine NEGATIVE NEGATIVE   Ketones, ur NEGATIVE NEGATIVE mg/dL   Protein, ur >=403 (A) NEGATIVE mg/dL   Nitrite NEGATIVE NEGATIVE   Leukocytes,Ua NEGATIVE NEGATIVE   RBC / HPF >50 0 - 5 RBC/hpf   WBC, UA 11-20 0 - 5 WBC/hpf   Bacteria, UA NONE SEEN NONE SEEN   Squamous Epithelial / HPF 6-10 0 - 5 /HPF   Mucus PRESENT     Comment: Performed at [facility], [address], [place], [place] [address]    ECHOCARDIOGRAM COMPLETE  Result Date: 02/18/2023    ECHOCARDIOGRAM REPORT   Patient Name:   Steven Bradley Date of Exam: 02/18/2023 Medical Rec #:  [id number]  Height:       78.0 in Accession #:    [id number]     Weight:       209.4 lb Date of Birth:  [birth year]     BSA:          2.302 m Patient Age:    77 years       BP:            105/82 mmHg Patient Gender: M              HR:           89 bpm. Exam Location:  Inpatient Procedure: 2D Echo, Color Doppler and Cardiac Doppler Indications:    R55 Syncope; I48.91* Unspeicified atrial fibrillation  History:        Patient has no prior history of Echocardiogram examinations.                 Abnormal ECG, Arrythmias:Tachycardia, Signs/Symptoms:Bacteremia;                 Risk Factors:Hypertension and Dyslipidemia. Seizure. ETOH.  Sonographer:    Sheralyn Boatman RDCS Referring Phys: [id number] [address] S [facility]  Sonographer Comments: Technically difficult study due to poor echo windows. Patient leaning more right decubitus with broken hip and arm. Could not turn IMPRESSIONS  1. Left ventricular ejection fraction, by estimation, is 60 to 65%. The left ventricle has normal function. The left ventricle has no regional wall motion abnormalities. There is mild left ventricular hypertrophy. Left ventricular diastolic parameters are consistent with Grade I diastolic dysfunction (impaired relaxation).  2. Right ventricular systolic function is low normal. The right ventricular size is moderately enlarged. Tricuspid regurgitation signal is inadequate for assessing PA pressure.  3. The mitral valve is grossly normal. Trivial mitral valve regurgitation. No evidence of mitral stenosis.  4. The aortic valve is tricuspid. Aortic valve regurgitation is not visualized. No aortic stenosis is present.  5. Aortic dilatation noted. There is borderline dilatation of the aortic root, measuring 37 mm. There is borderline dilatation of the ascending aorta, measuring 37 mm.  6. IVC is not visualized. FINDINGS  Left Ventricle: Left ventricular ejection fraction, by estimation, is 60 to 65%. The left ventricle has normal function. The left ventricle has no regional wall motion abnormalities. The left ventricular internal cavity size was normal in size. There is  mild left ventricular hypertrophy. Left ventricular diastolic parameters are  consistent with Grade I diastolic dysfunction (impaired relaxation). Right Ventricle: The right ventricular size is moderately enlarged. No increase in right ventricular wall thickness. Right ventricular systolic function is low normal. Tricuspid regurgitation signal is inadequate for assessing PA pressure. Left Atrium: Left atrial size was normal in size. Right Atrium: Right atrial size was normal in size. Pericardium: There is no evidence of pericardial effusion. Mitral Valve: The mitral valve is grossly normal. Trivial mitral valve regurgitation. No evidence of mitral valve stenosis. Tricuspid Valve: The tricuspid valve is normal in structure. Tricuspid valve regurgitation is not demonstrated. No evidence of tricuspid stenosis. Aortic Valve: The aortic valve is tricuspid. Aortic valve regurgitation is not visualized. No aortic stenosis is present. Pulmonic Valve: The pulmonic valve was normal in structure. Pulmonic valve regurgitation is not visualized. No evidence of pulmonic stenosis. Aorta: Aortic dilatation noted. There is borderline dilatation of the aortic root, measuring 37 mm. There is borderline dilatation of the ascending aorta, measuring 37 mm. Venous: IVC is not visualized. IAS/Shunts: No atrial level shunt detected by color flow Doppler.  LEFT VENTRICLE PLAX 2D LVIDd:         4.20 cm     Diastology LVIDs:         2.50 cm     LV e' medial:    7.51 cm/s LV PW:         1.30 cm     LV E/e' medial:  8.9 LV IVS:        1.10 cm     LV e' lateral:   7.51 cm/s LVOT diam:     2.60 cm     LV E/e' lateral: 8.9 LV SV:         78 LV SV Index:   34 LVOT Area:     5.31 cm  LV Volumes (MOD) LV vol d, MOD A2C: 81.1 ml LV vol d, MOD A4C: 73.0 ml LV vol s, MOD A2C: 26.3 ml LV vol s, MOD A4C: 33.4 ml LV SV MOD A2C:     54.8 ml LV SV MOD A4C:     73.0 ml LV SV MOD BP:      47.9 ml RIGHT VENTRICLE             IVC RV S prime:     14.10 cm/s  IVC diam: 2.60 cm TAPSE (M-mode): 1.8 cm LEFT ATRIUM             Index       RIGHT  ATRIUM           Index LA diam:        3.00 cm 1.30 cm/m  RA Area:     14.40 cm LA Vol (A2C):   20.4 ml 8.86 ml/m  RA Volume:   33.00 ml  14.33 ml/m LA Vol (A4C):   17.2 ml 7.47 ml/m LA Biplane Vol: 20.1 ml 8.73 ml/m  AORTIC VALVE LVOT Vmax:   102.00 cm/s LVOT Vmean:  63.000 cm/s LVOT VTI:    0.146 m  AORTA Ao Root diam: 3.75 cm Ao Asc diam:  3.70 cm MITRAL VALVE MV Area (PHT): 3.77 cm     SHUNTS MV Decel Time: 201 msec     Systemic VTI:  0.15 m MV E velocity: 66.70 cm/s   Systemic Diam: 2.60 cm MV A velocity: 110.00 cm/s MV E/A ratio:  0.61 [place] signed by Carolan Clines Signature Date/Time: 02/18/2023/4:21:52 PM    Final    DG Hip Unilat W or Wo Pelvis 2-3 Views Left  Result Date: 02/17/2023 CLINICAL DATA:  Left hip pain status post fall today EXAM: DG HIP (WITH OR WITHOUT PELVIS) 2-3V LEFT COMPARISON:  Left pelvis radiograph 02/17/2023 FINDINGS: Mildly displaced and angulated left femoral neck fracture. Diffuse osteopenia. Atherosclerotic changes seen throughout visualized arterial segments. IMPRESSION: Mildly displaced and angulated left femoral neck fracture. Electronically Signed   By: Acquanetta Belling M.D.   On: 02/17/2023 18:41   DG Shoulder Right Port  Result Date: 02/17/2023 CLINICAL DATA:  Right shoulder pain and swelling EXAM: RIGHT SHOULDER - 1 VIEW COMPARISON:  None available FINDINGS: Moderately displaced fracture of the right humeral neck, possibly extending through the greater tuberosity. Mild degenerative changes of the right acromioclavicular joint. IMPRESSION: Moderately displaced right proximal humerus fracture. Electronically Signed   By: Acquanetta Belling M.D.   On: 02/17/2023 18:40   DG Chest Portable 1 View  Result Date: 02/17/2023 CLINICAL DATA:  Chest pain EXAM: PORTABLE CHEST 1 VIEW COMPARISON:  05/15/2019 FINDINGS: Atherosclerotic calcification of the aortic arch. Thoracic spondylosis. The  lungs appear clear. Upper normal heart size. No findings of pulmonary  edema or airspace opacity. No significant blunting of the costophrenic angles observed. IMPRESSION: 1. No acute findings. 2. Upper normal heart size. 3. Thoracic spondylosis. Electronically Signed   By: Gaylyn Rong M.D.   On: 02/17/2023 16:55   DG Knee Complete 4 Views Left  Result Date: 02/17/2023 CLINICAL DATA:  Fall in shower. Left hip pain. Acute left hip fracture. EXAM: LEFT KNEE - COMPLETE 4+ VIEW COMPARISON:  None Available. FINDINGS: Atheromatous vascular calcifications noted. Small knee effusion in the suprapatellar bursa, less than was shown on 12/24/2015. Medial compartmental articular space narrowing compatible with osteoarthritis. Mild spurring along the femoral trochlear groove. No fracture identified. IMPRESSION: 1. Small knee effusion in the suprapatellar bursa. 2. Medial compartmental osteoarthritis. 3. Atheromatous vascular calcifications. Electronically Signed   By: Gaylyn Rong M.D.   On: 02/17/2023 16:54   DG Pelvis 1-2 Views  Result Date: 02/17/2023 CLINICAL DATA:  Fall in shower today, left hip pain. EXAM: PELVIS - 1-2 VIEW COMPARISON:  10/03/2020 FINDINGS: Acute left femoral neck fracture with mild varus angulation. Degenerative arthropathy of the hips, left greater than right, with associated craniocaudad articular cartilage thinning. Lumbar spondylosis with prominent bony bridging spurring L4-5. Pelvic vascular calcifications noted. IMPRESSION: 1. Acute left femoral neck fracture with mild varus angulation. 2. Degenerative arthropathy of the hips, left greater than right. 3. Lumbar spondylosis. Electronically Signed   By: Gaylyn Rong M.D.   On: 02/17/2023 16:52   CT Head Wo Contrast  Result Date: 02/17/2023 CLINICAL DATA:  Fall, lip swelling, head and face pain EXAM: CT HEAD WITHOUT CONTRAST CT MAXILLOFACIAL WITHOUT CONTRAST CT CERVICAL SPINE WITHOUT CONTRAST TECHNIQUE: Multidetector CT imaging of the head, cervical spine, and maxillofacial structures were  performed using the standard protocol without intravenous contrast. Multiplanar CT image reconstructions of the cervical spine and maxillofacial structures were also generated. RADIATION DOSE REDUCTION: This exam was performed according to the departmental dose-optimization program which includes automated exposure control, adjustment of the mA and/or kV according to patient size and/or use of iterative reconstruction technique. COMPARISON:  10/03/2020 FINDINGS: CT HEAD FINDINGS Brain: No evidence of acute infarction, hemorrhage, hydrocephalus, extra-axial collection or mass lesion/mass effect. Mild periventricular white matter hypodensity. Vascular: No hyperdense vessel or unexpected calcification. CT FACIAL BONES FINDINGS Skull: Normal. Negative for fracture or focal lesion. Facial bones: No displaced fractures or dislocations. Sinuses/Orbits: No acute finding. Other: Soft tissue contusion of the lips (series 3, image 75). CT CERVICAL SPINE FINDINGS Alignment: Normal. Skull base and vertebrae: No acute fracture. No primary bone lesion or focal pathologic process. Soft tissues and spinal canal: No prevertebral fluid or swelling. No visible canal hematoma. Disc levels: Mild-to-moderate disc space height loss and osteophytosis, worst from C5-C7. Upper chest: Negative. Other: None. IMPRESSION: 1. No acute intracranial pathology. 2. No displaced fractures or dislocations of the facial bones. 3. Soft tissue contusion of the lips. 4. No fracture or static subluxation of the cervical spine. 5. 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 Electronically Signed   By: Jearld Lesch M.D.   On: 02/17/2023 15:25   CT Cervical Spine Wo Contrast  Result Date: 02/17/2023 CLINICAL DATA:  Fall, lip swelling, head and face pain EXAM: CT HEAD WITHOUT CONTRAST CT MAXILLOFACIAL WITHOUT CONTRAST CT CERVICAL SPINE WITHOUT CONTRAST TECHNIQUE: Multidetector CT imaging of the head, cervical spine, and maxillofacial structures  were performed using the standard protocol without intravenous contrast. Multiplanar CT image reconstructions of the cervical spine and maxillofacial  structures were also generated. RADIATION DOSE REDUCTION: This exam was performed according to the departmental dose-optimization program which includes automated exposure control, adjustment of the mA and/or kV according to patient size and/or use of iterative reconstruction technique. COMPARISON:  10/03/2020 FINDINGS: CT HEAD FINDINGS Brain: No evidence of acute infarction, hemorrhage, hydrocephalus, extra-axial collection or mass lesion/mass effect. Mild periventricular white matter hypodensity. Vascular: No hyperdense vessel or unexpected calcification. CT FACIAL BONES FINDINGS Skull: Normal. Negative for fracture or focal lesion. Facial bones: No displaced fractures or dislocations. Sinuses/Orbits: No acute finding. Other: Soft tissue contusion of the lips (series 3, image 75). CT CERVICAL SPINE FINDINGS Alignment: Normal. Skull base and vertebrae: No acute fracture. No primary bone lesion or focal pathologic process. Soft tissues and spinal canal: No prevertebral fluid or swelling. No visible canal hematoma. Disc levels: Mild-to-moderate disc space height loss and osteophytosis, worst from C5-C7. Upper chest: Negative. Other: None. IMPRESSION: 1. No acute intracranial pathology. 2. No displaced fractures or dislocations of the facial bones. 3. Soft tissue contusion of the lips. 4. No fracture or static subluxation of the cervical spine. 5. 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 Electronically Signed   By: Jearld Lesch M.D.   On: 02/17/2023 15:25   CT Maxillofacial Wo Contrast  Result Date: 02/17/2023 CLINICAL DATA:  Fall, lip swelling, head and face pain EXAM: CT HEAD WITHOUT CONTRAST CT MAXILLOFACIAL WITHOUT CONTRAST CT CERVICAL SPINE WITHOUT CONTRAST TECHNIQUE: Multidetector CT imaging of the head, cervical spine, and maxillofacial structures  were performed using the standard protocol without intravenous contrast. Multiplanar CT image reconstructions of the cervical spine and maxillofacial structures were also generated. RADIATION DOSE REDUCTION: This exam was performed according to the departmental dose-optimization program which includes automated exposure control, adjustment of the mA and/or kV according to patient size and/or use of iterative reconstruction technique. COMPARISON:  10/03/2020 FINDINGS: CT HEAD FINDINGS Brain: No evidence of acute infarction, hemorrhage, hydrocephalus, extra-axial collection or mass lesion/mass effect. Mild periventricular white matter hypodensity. Vascular: No hyperdense vessel or unexpected calcification. CT FACIAL BONES FINDINGS Skull: Normal. Negative for fracture or focal lesion. Facial bones: No displaced fractures or dislocations. Sinuses/Orbits: No acute finding. Other: Soft tissue contusion of the lips (series 3, image 75). CT CERVICAL SPINE FINDINGS Alignment: Normal. Skull base and vertebrae: No acute fracture. No primary bone lesion or focal pathologic process. Soft tissues and spinal canal: No prevertebral fluid or swelling. No visible canal hematoma. Disc levels: Mild-to-moderate disc space height loss and osteophytosis, worst from C5-C7. Upper chest: Negative. Other: None. IMPRESSION: 1. No acute intracranial pathology. 2. No displaced fractures or dislocations of the facial bones. 3. Soft tissue contusion of the lips. 4. No fracture or static subluxation of the cervical spine. 5. 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 Electronically Signed   By: Jearld Lesch M.D.   On: 02/17/2023 15:25    ROS: No recent fever, chills, nausea, vomiting or changes in his appetite.  Seizures as noted above.  New onset A-fib as noted above.  10 system review is otherwise negative. PE:  Blood pressure (!) 142/84, pulse 92, temperature 98 F (36.7 C), temperature source Oral, resp. rate 20, height 6\' 6"   (1.981 m), weight 95 kg, SpO2 94%. Well-nourished well-developed man in no apparent distress.  Alert and oriented.  Normal mood and affect.  He has some shortening of his left lower extremity as compared to the right.  2+ dorsalis pedis pulse in the left foot.  Active plantarflexion and dorsiflexion strength at the toes.  No lymphadenopathy.  The right shoulder is swollen and tender to palpation.  He has intrinsic muscle wasting of the right hand.  Weakness in the ulnar nerve distribution.  Normal strength in the radial and median nerve distributions.  Intact sensibility to light touch in the radial and median nerve distribution.  2+ radial pulse.  Assessment/Plan: Right proximal humerus fracture - CT scan tonight.  I will talk with Dr. Ranell Patrick about evaluation of this injury in the morning.  Left femoral neck fracture -the patient will need operative treatment of the left femoral neck fracture likely on Sunday.  Hold blood thinners.  Continue nonweightbearing on the left lower extremity.  I explained the nature of the injuries and the plan to the patient and his wife in detail.  They understand and agree.  Toni Arthurs 02/18/2023, 8:34 PM

## 2023-02-18 NOTE — Progress Notes (Addendum)
PROGRESS NOTE   Steven Bradley  ZOX:[id number] DOB: March 02, 1946 DOA: 02/17/2023 PCP: [facility]   Chief Complaint  Patient presents with   Seizures   Level of care: Telemetry  Brief Admission History:  77 y.o. male, with past medical history significant for EtOH use disorder, EtOH cirrhosis, HTN, HLD, prostate cancer, epilepsy, patient presented to ED secondary to fall and seizures -Wife at bedside provide the history, she reports patient with heavy drinking, he did cut down but still with significant alcohol use, last drink was 2 ago, she hears a loud noise at the bathroom in their RV, where she found him on the floor, this happened around 9:30 AM, patient reports he struck his face on the toilet, had bleeding and swelling of his lips, there was no loss of consciousness noted, he refused transport by EMS, wife assisted him to the couch where an hour later where he was witnessed to have seizures, lasted for 7 to 8 minutes, it was witnessed by brother-in-law, will describe generalized jerking and shaking, reports patient did not have seizures for last 26-month, and he has been compliant with his meds Keppra,.  Patient with left hip pain, wife thought secondary to gout (x-ray was significant for femur fracture), as well he does endorse right shoulder pain as well which they think it is due to gout as well( shoulder x-ray still pending). -In ED patient was noted to be with significant lip bruise, tongue bleed, he was noted to be in new onset A-fib with RVR, requiring Cardizem drip, he had severe electrolyte derangement loading sodium of 133, potassium of 2.5, magnesium of 0.8, phosphorus of 2.2, anion gap of 19, AST of 247, ALT of 74, total bili of 3.5, his MCV was 100.5, and platelet count was 99K, his x-ray significant for left hip fracture as well, [facility] consulted to admit.   Assessment and Plan:  Right proximal humerus fracture Mildly displaced and angulated left femoral  neck fracture - discussed with Dr. Romeo Apple, recommendation is to transfer to [facility] given medical complications - discussed with Dr. Victorino Dike of [facility], he will consult on patient and requested patient be transferred to [facility].  - continue pain management - medical issues now controlled and stabilized and should be able to go to OR when ok with the surgeon - he is not being anticoagulated at this time  New Diagnosis of Atrial Fibrillation with RVR - RESOLVED - he has been on IV diltiazem and now back in SR - now off IV diltiazem and started on oral cardizem 30 mg every 6 hours - BPs have been stable and well controlled  - he has not been started on anticoagulation due to fall risk, severe alcohol abuse, thrombocytopenia  Epilepsy with breakthrough seizures - reportedly has been compliant with taking keppra but with severe alcohol abuse I wonder if he has been missing doses? - he was loaded with keppra in the ED - ED discussed with Dr Darlyn Read and recommendation was to increase dose to 1250 mg BID and no need to repeat EEG  - will change to oral formulation now that he is able to take p.o.  - continue seizure precautions - monitoring closely for acute alcohol withdrawal and trying to avoid this   Chronic severe alcohol abuse - wife reports heavy alcohol consumption and develops "shakes" - he is on CIWA protocol - will add librium  - continue thiamine, folic acid and MVI  Severe hypokalemia - this has been repleted,  following  Severe hypomagnesemia - IV replacement given and repleted - follow  Hypophosphatemia - this has been repleted   Hyperglycemia  - follow up A1c  Depression  - we tried to resume home sertraline and trazodone - update: pt refusing to take in hospital and most likely not taking at home either  Hyponatremia - he still appears to be dehydrated - added normal saline infusion   Transaminitis - LFT elevation consistent with chronic  alcohol abuse  GERD - continue pantoprazole for GI protection   Macrocytosis - B12 low normal at 208  - folate 10.8    DVT prophylaxis: SCDs Code Status: DNR  Family Communication: wife updated at bedside and phone 12/6  Disposition: transfer to [facility] telemetry monitored bed    Consultants:  Orthopedics  Procedures:  TBD  Antimicrobials:    Subjective: Pt having some pain in lower lip and dry mouth requesting liquids.   Objective: Vitals:   02/18/23 0745 02/18/23 0800 02/18/23 1143 02/18/23 1154  BP:  116/68  113/75  Pulse:  86  99  Resp:  16  16  Temp: 98.6 F (37 C)  (!) 97.4 F (36.3 C)   TempSrc: Oral  Oral   SpO2:  94%  95%  Weight:      Height:        Intake/Output Summary (Last 24 hours) at 02/18/2023 1222 Last data filed at 02/18/2023 1003 Gross per 24 hour  Intake 1189.23 ml  Output 150 ml  Net 1039.23 ml   Filed Weights   02/17/23 1209 02/17/23 1744  Weight: 87.1 kg 95 kg   Examination:  General exam: Appears calm and comfortable, awake, alert, cooperative, swollen bottom lip seen with small lacerations.   Respiratory system: Clear to auscultation. Respiratory effort normal. Cardiovascular system: normal S1 & S2 heard. No JVD, murmurs, rubs, gallops or clicks. 1+  pedal edema. Gastrointestinal system: Abdomen is nondistended, soft and nontender. No organomegaly or masses felt. Normal bowel sounds heard. Central nervous system: Alert and oriented. No focal neurological deficits. Extremities: Symmetric 5 x 5 power. Skin: No rashes, lesions or ulcers. Psychiatry: Judgement and insight appear UTD. Mood & affect appropriate.   Data Reviewed: I have personally reviewed following labs and imaging studies  CBC: Recent Labs  Lab 02/17/23 1330 02/18/23 0446  WBC 9.9 12.3*  NEUTROABS 8.3*  --   HGB 14.6 11.3*  HCT 42.9 32.8*  MCV 100.5* 99.7  PLT 99* 89*    Basic Metabolic Panel: Recent Labs  Lab 02/17/23 1330 02/17/23 2325 02/18/23 0446  NA  133* 131* 130*  K 2.5* 3.9 3.5  CL 94* 96* 96*  CO2 20* 23 22  GLUCOSE 198* 135* 127*  BUN 13 15 18   CREATININE 0.97 0.92 1.13  CALCIUM 8.3* 7.9* 7.8*  MG 0.8* 1.6* 2.2  PHOS 2.2*  --  4.3    CBG: Recent Labs  Lab 02/17/23 1535  GLUCAP 183*    Recent Results (from the past 240 hour(s))  MRSA Next Gen by PCR, Nasal     Status: None   Collection Time: 02/17/23  5:12 PM   Specimen: Nasal Mucosa; Nasal Swab  Result Value Ref Range Status   MRSA by PCR Next Gen NOT DETECTED NOT DETECTED Final    Comment: (NOTE) The GeneXpert MRSA Assay (FDA approved for NASAL specimens only), is one component of a comprehensive MRSA colonization surveillance program. It is not intended to diagnose MRSA infection nor to guide or monitor treatment for MRSA  infections. Test performance is not FDA approved in patients less than 54 years old. Performed at [facility], [address] [place], [place] [address]      Radiology Studies: DG Hip Lucienne Capers or Wo Pelvis 2-3 Views Left  Result Date: 02/17/2023 CLINICAL DATA:  Left hip pain status post fall today EXAM: DG HIP (WITH OR WITHOUT PELVIS) 2-3V LEFT COMPARISON:  Left pelvis radiograph 02/17/2023 FINDINGS: Mildly displaced and angulated left femoral neck fracture. Diffuse osteopenia. Atherosclerotic changes seen throughout visualized arterial segments. IMPRESSION: Mildly displaced and angulated left femoral neck fracture. Electronically Signed   By: Acquanetta Belling M.D.   On: 02/17/2023 18:41   DG Shoulder Right Port  Result Date: 02/17/2023 CLINICAL DATA:  Right shoulder pain and swelling EXAM: RIGHT SHOULDER - 1 VIEW COMPARISON:  None available FINDINGS: Moderately displaced fracture of the right humeral neck, possibly extending through the greater tuberosity. Mild degenerative changes of the right acromioclavicular joint. IMPRESSION: Moderately displaced right proximal humerus fracture. Electronically Signed   By: Acquanetta Belling M.D.   On: 02/17/2023  18:40   DG Chest Portable 1 View  Result Date: 02/17/2023 CLINICAL DATA:  Chest pain EXAM: PORTABLE CHEST 1 VIEW COMPARISON:  05/15/2019 FINDINGS: Atherosclerotic calcification of the aortic arch. Thoracic spondylosis. The lungs appear clear. Upper normal heart size. No findings of pulmonary edema or airspace opacity. No significant blunting of the costophrenic angles observed. IMPRESSION: 1. No acute findings. 2. Upper normal heart size. 3. Thoracic spondylosis. Electronically Signed   By: Gaylyn Rong M.D.   On: 02/17/2023 16:55   DG Knee Complete 4 Views Left  Result Date: 02/17/2023 CLINICAL DATA:  Fall in shower. Left hip pain. Acute left hip fracture. EXAM: LEFT KNEE - COMPLETE 4+ VIEW COMPARISON:  None Available. FINDINGS: Atheromatous vascular calcifications noted. Small knee effusion in the suprapatellar bursa, less than was shown on 12/24/2015. Medial compartmental articular space narrowing compatible with osteoarthritis. Mild spurring along the femoral trochlear groove. No fracture identified. IMPRESSION: 1. Small knee effusion in the suprapatellar bursa. 2. Medial compartmental osteoarthritis. 3. Atheromatous vascular calcifications. Electronically Signed   By: Gaylyn Rong M.D.   On: 02/17/2023 16:54   DG Pelvis 1-2 Views  Result Date: 02/17/2023 CLINICAL DATA:  Fall in shower today, left hip pain. EXAM: PELVIS - 1-2 VIEW COMPARISON:  10/03/2020 FINDINGS: Acute left femoral neck fracture with mild varus angulation. Degenerative arthropathy of the hips, left greater than right, with associated craniocaudad articular cartilage thinning. Lumbar spondylosis with prominent bony bridging spurring L4-5. Pelvic vascular calcifications noted. IMPRESSION: 1. Acute left femoral neck fracture with mild varus angulation. 2. Degenerative arthropathy of the hips, left greater than right. 3. Lumbar spondylosis. Electronically Signed   By: Gaylyn Rong M.D.   On: 02/17/2023 16:52   CT  Head Wo Contrast  Result Date: 02/17/2023 CLINICAL DATA:  Fall, lip swelling, head and face pain EXAM: CT HEAD WITHOUT CONTRAST CT MAXILLOFACIAL WITHOUT CONTRAST CT CERVICAL SPINE WITHOUT CONTRAST TECHNIQUE: Multidetector CT imaging of the head, cervical spine, and maxillofacial structures were performed using the standard protocol without intravenous contrast. Multiplanar CT image reconstructions of the cervical spine and maxillofacial structures were also generated. RADIATION DOSE REDUCTION: This exam was performed according to the departmental dose-optimization program which includes automated exposure control, adjustment of the mA and/or kV according to patient size and/or use of iterative reconstruction technique. COMPARISON:  10/03/2020 FINDINGS: CT HEAD FINDINGS Brain: No evidence of acute infarction, hemorrhage, hydrocephalus, extra-axial collection or mass lesion/mass effect.  Mild periventricular white matter hypodensity. Vascular: No hyperdense vessel or unexpected calcification. CT FACIAL BONES FINDINGS Skull: Normal. Negative for fracture or focal lesion. Facial bones: No displaced fractures or dislocations. Sinuses/Orbits: No acute finding. Other: Soft tissue contusion of the lips (series 3, image 75). CT CERVICAL SPINE FINDINGS Alignment: Normal. Skull base and vertebrae: No acute fracture. No primary bone lesion or focal pathologic process. Soft tissues and spinal canal: No prevertebral fluid or swelling. No visible canal hematoma. Disc levels: Mild-to-moderate disc space height loss and osteophytosis, worst from C5-C7. Upper chest: Negative. Other: None. IMPRESSION: 1. No acute intracranial pathology. 2. No displaced fractures or dislocations of the facial bones. 3. Soft tissue contusion of the lips. 4. No fracture or static subluxation of the cervical spine. 5. 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 Electronically Signed   By: Jearld Lesch M.D.   On: 02/17/2023 15:25   CT Cervical  Spine Wo Contrast  Result Date: 02/17/2023 CLINICAL DATA:  Fall, lip swelling, head and face pain EXAM: CT HEAD WITHOUT CONTRAST CT MAXILLOFACIAL WITHOUT CONTRAST CT CERVICAL SPINE WITHOUT CONTRAST TECHNIQUE: Multidetector CT imaging of the head, cervical spine, and maxillofacial structures were performed using the standard protocol without intravenous contrast. Multiplanar CT image reconstructions of the cervical spine and maxillofacial structures were also generated. RADIATION DOSE REDUCTION: This exam was performed according to the departmental dose-optimization program which includes automated exposure control, adjustment of the mA and/or kV according to patient size and/or use of iterative reconstruction technique. COMPARISON:  10/03/2020 FINDINGS: CT HEAD FINDINGS Brain: No evidence of acute infarction, hemorrhage, hydrocephalus, extra-axial collection or mass lesion/mass effect. Mild periventricular white matter hypodensity. Vascular: No hyperdense vessel or unexpected calcification. CT FACIAL BONES FINDINGS Skull: Normal. Negative for fracture or focal lesion. Facial bones: No displaced fractures or dislocations. Sinuses/Orbits: No acute finding. Other: Soft tissue contusion of the lips (series 3, image 75). CT CERVICAL SPINE FINDINGS Alignment: Normal. Skull base and vertebrae: No acute fracture. No primary bone lesion or focal pathologic process. Soft tissues and spinal canal: No prevertebral fluid or swelling. No visible canal hematoma. Disc levels: Mild-to-moderate disc space height loss and osteophytosis, worst from C5-C7. Upper chest: Negative. Other: None. IMPRESSION: 1. No acute intracranial pathology. 2. No displaced fractures or dislocations of the facial bones. 3. Soft tissue contusion of the lips. 4. No fracture or static subluxation of the cervical spine. 5. 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 Electronically Signed   By: Jearld Lesch M.D.   On: 02/17/2023 15:25   CT  Maxillofacial Wo Contrast  Result Date: 02/17/2023 CLINICAL DATA:  Fall, lip swelling, head and face pain EXAM: CT HEAD WITHOUT CONTRAST CT MAXILLOFACIAL WITHOUT CONTRAST CT CERVICAL SPINE WITHOUT CONTRAST TECHNIQUE: Multidetector CT imaging of the head, cervical spine, and maxillofacial structures were performed using the standard protocol without intravenous contrast. Multiplanar CT image reconstructions of the cervical spine and maxillofacial structures were also generated. RADIATION DOSE REDUCTION: This exam was performed according to the departmental dose-optimization program which includes automated exposure control, adjustment of the mA and/or kV according to patient size and/or use of iterative reconstruction technique. COMPARISON:  10/03/2020 FINDINGS: CT HEAD FINDINGS Brain: No evidence of acute infarction, hemorrhage, hydrocephalus, extra-axial collection or mass lesion/mass effect. Mild periventricular white matter hypodensity. Vascular: No hyperdense vessel or unexpected calcification. CT FACIAL BONES FINDINGS Skull: Normal. Negative for fracture or focal lesion. Facial bones: No displaced fractures or dislocations. Sinuses/Orbits: No acute finding. Other: Soft tissue contusion of the lips (series 3, image 75). CT  CERVICAL SPINE FINDINGS Alignment: Normal. Skull base and vertebrae: No acute fracture. No primary bone lesion or focal pathologic process. Soft tissues and spinal canal: No prevertebral fluid or swelling. No visible canal hematoma. Disc levels: Mild-to-moderate disc space height loss and osteophytosis, worst from C5-C7. Upper chest: Negative. Other: None. IMPRESSION: 1. No acute intracranial pathology. 2. No displaced fractures or dislocations of the facial bones. 3. Soft tissue contusion of the lips. 4. No fracture or static subluxation of the cervical spine. 5. 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 Electronically Signed   By: Jearld Lesch M.D.   On: 02/17/2023 15:25     Scheduled Meds:  allopurinol  100 mg Oral Daily   Chlorhexidine Gluconate Cloth  6 each Topical Q0600   diltiazem  30 mg Oral Q6H   folic acid  1 mg Intravenous Daily   sertraline  100 mg Oral Daily   traZODone  100 mg Oral QHS   Continuous Infusions:  0.9 % NaCl with KCl 20 mEq / L 65 mL/hr at 02/18/23 1003   levETIRAcetam 1,250 mg (02/18/23 1103)   thiamine (VITAMIN B1) injection 500 mg (02/17/23 2351)     LOS: 1 day   Critical Care Procedure Note Authorized and Performed by: Maryln Manuel MD  Total Critical Care time:  57 mins Due to a high probability of clinically significant, life threatening deterioration, the patient required my highest level of preparedness to intervene emergently and I personally spent this critical care time directly and personally managing the patient.  This critical care time included obtaining a history; examining the patient, pulse oximetry; ordering and review of studies; arranging urgent treatment with development of a management plan; evaluation of patient's response of treatment; frequent reassessment; and discussions with other providers.  This critical care time was performed to assess and manage the high probability of imminent and life threatening deterioration that could result in multi-organ failure.  It was exclusive of separately billable procedures and treating other patients and teaching time.    Standley Dakins, MD How to contact the [facility] Attending or Consulting provider 7A - 7P or covering provider during after hours 7P -7A, for this patient?  Check the care team in [facility] and look for a) attending/consulting [facility] provider listed and b) the [facility] team listed Log into [email or web address] to find provider on call.  Locate the [facility] provider you are looking for under [facility] and page to a number that you can be directly reached. If you still have difficulty reaching the provider, please page the [facility] (Director on Call) for the Hospitalists listed  on amion for assistance.  02/18/2023, 12:22 PM

## 2023-02-18 NOTE — Progress Notes (Signed)
77 y/o male with displaced left femoral neck fracture and displaced right proximal humerus fracture.  He will need surgical treatment of the left hip.  This will likely be Sunday.  OK to eat.  Please hold blood thinners.  Full consult note to follow.

## 2023-02-18 NOTE — Plan of Care (Signed)

## 2023-02-18 NOTE — Progress Notes (Addendum)
Valente David, MD notified patient has only had 50cc amber urine out during this shift and bladder scan only shows 30cc. No new orders.

## 2023-02-18 NOTE — Progress Notes (Signed)
  Echocardiogram 2D Echocardiogram has been performed.  Janalyn Harder 02/18/2023, 3:43 PM

## 2023-02-18 NOTE — Hospital Course (Addendum)
Steven Bradley is a 77 y.o. male with a history of seizure disorder, HTN, HLD, prostate CA, EtOH abuse and alcohol-associated hepatic ascites who presented to the ED on 02/17/2023 after a fall. His spouse reported hearing a loud noise at the bathroom in their RV, where she found him on the floor, this happened around 9:30 AM, patient reports he struck his face on the toilet, had bleeding and swelling of his lips, there was no loss of consciousness noted, he refused transport by EMS, wife assisted him to the couch where an hour later where he was witnessed to have seizures, lasted for 7 to 8 minutes, it was witnessed by brother-in-law, will describe generalized jerking and shaking, reports patient did not have seizures for last 47-month, and he has been compliant with his meds Keppra,.  Patient with left hip pain, wife thought secondary to gout (x-ray was significant for femur fracture), as well he does endorse right shoulder pain as well which they think it is due to gout as well( shoulder x-ray still pending). -In ED patient was noted to be with significant lip bruise, tongue bleed, he was noted to be in new onset A-fib with RVR, requiring Cardizem drip, he had severe electrolyte derangement loading sodium of 133, potassium of 2.5, magnesium of 0.8, phosphorus of 2.2, anion gap of 19, AST of 247, ALT of 74, total bili of 3.5, his MCV was 100.5, and platelet count was 99K, his x-ray significant for left hip fracture as well, [facility] hospitalist consulted to admit.   He was transported to [facility] for orthopedic surgery. Hospitalization complicated by new onset AFib.   **Interim History PT OT recommending SNF.  Blood count has relatively stabilized and platelet count is improving.  LFTs are still abnormal but will need to continue to monitor closely in the outpatient setting.  We are initiating anticoagulation at discharge given his immobility and given his PAF with RVR.  He will need to follow-up with PCP, Cardiology  and and neurology in outpatient setting given that he is now medically stable.  Will need to continue monitor his LFTs and recommend repeating within 1 week and if still elevated recommending further workup and referral to GI.  If patient continues to drink recommend discontinue anticoagulation given that he is a fall risk then but given that he is going to a controlled environment it is reasonable to initiate at this time.  ADDENDUM 02/25/23: Patient was deemed medically stable to be discharged yesterday however was unfortunately not able to go because the facility did not receive the [place] authorization information but has been received now.  He is still medically stable to be transferred and no acute issues overnight and need to follow-up with the specialist as above  Assessment and Plan:  Breakthrough seizure:  -Loaded with, and increased keppra dose per neurology, [name], no EEG required at this time. Continue 1,250mg  Levetiracetam BID -C/w Seizure precautions -Will follow up with Dr. Karel Jarvis at [facility].    Right humerus fracture: s/p reverse shoulder arthroplasty 12/9.  - Pain control, weight bearing per ortho   Left femur fracture: s/p anterior-approach THA 12/8 with Dr. Charlann Boxer - Postoperative pain control, weight bearing, wound care, and VTE ppx per orthopedics.  - Will obtain PT/OT evaluations and they are recommending SNF, anticipate rehab requirement and is stable for D/C    ABLA and Thrombocytopenia, improved  -s/p 2u RBCs. -Hgb/Hct and Platelet Count Trend: Recent Labs  Lab 02/18/23 0446 02/19/23 0516 02/20/23 0531 02/21/23 0502  02/22/23 0248 02/23/23 0524 02/24/23 0538  HGB 11.3* 9.3* 8.9* 6.7* 8.8* 8.4* 8.1*  HCT 32.8* 27.5* 26.1* 21.0* 26.8* 25.4* 25.9*  MCV 99.7 103.0* 102.4* 106.6* 99.3 98.4 103.6*  PLT 89* 79* 82* 109* 119* 145* 164  -Continue to Monitor for S/Sx of Bleeding; No overt bleeding noted -Monitor for bleeding given that we are initiating anticoagulation at  discharge for VTE prophylaxis as well as PAF stroke prevention -Repeat CBC within 1 week   New, paroxysmal atrial fibrillation with RVR:  -Resolved, back in NSR. Echo showed LVEF 60-65%, mild LVH, G1DD, no WMA. low-normal RVSF. Normal biatrial size. -Continue oral diltiazem, consolidated. -Was holding anticoagulation with severity of fall, EtOH use, and anemia/thrombocytopenia and we will initiate apixaban at discharge at 5 mg p.o. twice daily given that he is going to a controlled environment.  If patient continues to drink recommend having discussion with cardiology and PCP about risks versus benefits.  At this time we feel that is reasonable to place him on anticoagulation he is going to control environment and will not be allowed to drink and because his thrombocytopenia has improved and his anemia is stable.  Discussions can change in outpatient setting and further care per cardiology and PCP   EtOH Abuse: -Recent history per spouse is less drinking overall, not daily.  -Continued low dose librium taper. CIWA scores remain reassuring. -MVM, folate, thiamine per protocol and will need outpatient follow-up and continue with his vitamins -If patient continues to drink feel that he will not be a good candidate for anticoagulation but will place him on anticoagulation while he goes to SNF given his new onset A-fib with RVR and given his difficulty with mobility and his current condition   Chronic Gout -No exacerbation noted at this time.  -Continue home allopurinol and colchicine as needed.    HTN, HLD: -Continue meds as ordered and discontinue amlodipine and start diltiazem at discharge and continue home Lasix as needed    Hypokalemia -Patient's K+ Level Trend: Recent Labs  Lab 02/18/23 0446 02/19/23 0516 02/20/23 0531 02/21/23 0502 02/22/23 0248 02/23/23 0524 02/24/23 0538  K 3.5 3.3* 3.5 4.6 4.2 4.1 4.0  -Continue to Monitor and Replete as Necessary -Repeat CMP in the AM    Hypomagnesemia -Patient's Mag Level Trend: Recent Labs  Lab 02/17/23 2325 02/18/23 0446 02/19/23 0516 02/20/23 0531 02/21/23 0502 02/23/23 0926 02/24/23 0538  MG 1.6* 2.2 1.9 1.8 1.6* 1.6* 1.5*  -Replete with IV Mag Sulfate 4 grams prior to D/C -Continue to Monitor and Replete as Necessary -Repeat Mag within 1 week  Hypophosphatemia -Resolved.   Depression:  -Continue medications as per pt's spouse.    Hyponatremia, improving -Continued NS until taking adequate po. Liver disease contributing. -Na+ Trend: Recent Labs  Lab 02/18/23 0446 02/19/23 0516 02/20/23 0531 02/21/23 0502 02/22/23 0248 02/23/23 0524 02/24/23 0538  NA 130* 127* 128* 126* 127* 131* 133*  -If persistent postoperatively, would get further work up.   Transaminitis/Abnormal LFTs Hyperbilirubinemia -LFT elevation consistent with chronic alcohol abuse; recommend alcohol cessation -AST/ALT Trend: Recent Labs  Lab 02/18/23 0446 02/19/23 0516 02/20/23 0531 02/21/23 0502 02/22/23 0248 02/23/23 0524 02/24/23 0538  AST 117* 97* 192* 171* 101* 180* 139*  ALT 58* 52* 77* 68* 47* 67* 73*  -Continue to Monitor and Trend and Repeat CMP within 1 week   GERD/GI Prophylaxis -C/w PPI while hospitalized and resume home PPI discharge   Macrocytosis - B12 low normal at 208, will supplement - folate 10.8  -  Follow-up in outpatient setting and repeat anemia panel in 3 months  Hypoalbuminemia -Patient's Albumin Trend: Recent Labs  Lab 02/18/23 0446 02/19/23 0516 02/20/23 0531 02/21/23 0502 02/22/23 0248 02/23/23 0524 02/24/23 0538  ALBUMIN 3.0* 2.9* 2.7* 2.4* 2.6* 2.3* 2.3*  -Continue to Monitor and Trend and repeat CMP in the AM

## 2023-02-18 NOTE — Progress Notes (Signed)
Patient ID: Steven Bradley, male   DOB: 1946-02-18, 76 y.o.   MRN: [id number]  BP 134/75   Pulse 88   Temp 98.6 F (37 C) (Oral)   Resp (!) 21   Ht 6\' 6"  (1.981 m)   Wt 95 kg   SpO2 95%   BMI 24.20 kg/m    [name]  is a 77 y.o. male, with past medical history significant for EtOH use disorder, EtOH cirrhosis, HTN, HLD, prostate cancer, epilepsy, patient presented to ED secondary to fall and seizures -Wife at bedside provide the history, she reports patient with heavy drinking, he did cut down but still with significant alcohol use, last drink was 2 ago, she hears a loud noise at the bathroom in their RV, where she found him on the floor, this happened around 9:30 AM, patient reports he struck his face on the toilet, had bleeding and swelling of his lips, there was no loss of consciousness noted, he refused transport by EMS, wife assisted him to the couch where an hour later where he was witnessed to have seizures, lasted for 7 to 8 minutes, it was witnessed by brother-in-law, will describe generalized jerking and shaking, reports patient did not have seizures for last 73-month, and he has been compliant with his meds Keppra,.  Patient with left hip pain, wife thought secondary to gout (x-ray was significant for femur fracture), as well he does endorse right shoulder pain as well which they think it is due to gout as well( shoulder x-ray still pending). -In ED patient was noted to be with significant lip bruise, tongue bleed, he was noted to be in new onset A-fib with RVR, requiring Cardizem drip, he had severe electrolyte derangement loading sodium of 133, potassium of 2.5, magnesium of 0.8, phosphorus of 2.2, anion gap of 19, AST of 247, ALT of 74, total bili of 3.5, his MCV was 100.5, and platelet count was 99K, his x-ray significant for left hip fracture as well, [facility] hospitalist consulted to admit.    77 year old male multiple medical problems as listed above.  Recommend the patient be  treated at [facility] for his hip fracture because of his cardiac history, alcoholic history, seizure history, liver cirrhosis

## 2023-02-18 NOTE — Progress Notes (Signed)
Initial Nutrition Assessment  DOCUMENTATION CODES:   Not applicable  INTERVENTION:   Boost Breeze po TID, each supplement provides 250 kcal and 9 grams of protein. MVI with minerals daily. Continue thiamine.  NUTRITION DIAGNOSIS:   Increased nutrient needs related to post-op healing, hip fracture as evidenced by estimated needs.  GOAL:   Patient will meet greater than or equal to 90% of their needs  MONITOR:   PO intake, Supplement acceptance, Diet advancement  REASON FOR ASSESSMENT:   Consult Hip fracture protocol  ASSESSMENT:   77 yo male admitted with L hip fx s/p fall, seizure. PMH includes ETOH use disorder, ETOH cirrhosis, HTN, HLD, prostate cancer, epilepsy.  RD working remotely. Unable to speak with patient or complete NFPE at this time.    Labs reviewed. Na 130, B12 208 WNL, Folate 10.8 WNL  Medications reviewed and include folic acid, klor-con, thiamine. IVF: NS with 20 mEq KCl/L at 65 ml/h.   Usual weights reviewed. No significant weight loss noted.   Patient is at increased nutrition risk, given hx ETOH abuse.  Patient transferring to [facility] today for hip fracture treatment.   NUTRITION - FOCUSED PHYSICAL EXAM:  Unable to complete  Diet Order:   Diet Order             Diet clear liquid Fluid consistency: Thin  Diet effective now                   EDUCATION NEEDS:   Not appropriate for education at this time  Skin:  Skin Assessment: Skin Integrity Issues: Skin Integrity Issues:: Other (Comment) Other: lower lip laceration from fall  Last BM:  12/4  Height:   Ht Readings from Last 1 Encounters:  02/17/23 6\' 6"  (1.981 m)    Weight:   Wt Readings from Last 1 Encounters:  02/17/23 95 kg    Ideal Body Weight:  97.3 kg  BMI:  Body mass index is 24.2 kg/m.  Estimated Nutritional Needs:   Kcal:  2300-2600  Protein:  115-135 gm  Fluid:  2.3-2.6 L   Gabriel Rainwater RD, LDN, CNSC Please refer to Amion for contact information.

## 2023-02-19 ENCOUNTER — Inpatient Hospital Stay (HOSPITAL_COMMUNITY): Payer: No Typology Code available for payment source

## 2023-02-19 DIAGNOSIS — R569 Unspecified convulsions: Secondary | ICD-10-CM | POA: Diagnosis not present

## 2023-02-19 LAB — CBC WITH DIFFERENTIAL/PLATELET
Abs Immature Granulocytes: 0.13 10*3/uL — ABNORMAL HIGH (ref 0.00–0.07)
Basophils Absolute: 0 10*3/uL (ref 0.0–0.1)
Basophils Relative: 0 %
Eosinophils Absolute: 0.1 10*3/uL (ref 0.0–0.5)
Eosinophils Relative: 1 %
HCT: 27.5 % — ABNORMAL LOW (ref 39.0–52.0)
Hemoglobin: 9.3 g/dL — ABNORMAL LOW (ref 13.0–17.0)
Immature Granulocytes: 1 %
Lymphocytes Relative: 8 %
Lymphs Abs: 0.9 10*3/uL (ref 0.7–4.0)
MCH: 34.8 pg — ABNORMAL HIGH (ref 26.0–34.0)
MCHC: 33.8 g/dL (ref 30.0–36.0)
MCV: 103 fL — ABNORMAL HIGH (ref 80.0–100.0)
Monocytes Absolute: 1.2 10*3/uL — ABNORMAL HIGH (ref 0.1–1.0)
Monocytes Relative: 10 %
Neutro Abs: 9 10*3/uL — ABNORMAL HIGH (ref 1.7–7.7)
Neutrophils Relative %: 80 %
Platelets: 79 10*3/uL — ABNORMAL LOW (ref 150–400)
RBC: 2.67 MIL/uL — ABNORMAL LOW (ref 4.22–5.81)
RDW: 13.6 % (ref 11.5–15.5)
WBC: 11.4 10*3/uL — ABNORMAL HIGH (ref 4.0–10.5)
nRBC: 0 % (ref 0.0–0.2)

## 2023-02-19 LAB — COMPREHENSIVE METABOLIC PANEL
ALT: 52 U/L — ABNORMAL HIGH (ref 0–44)
AST: 97 U/L — ABNORMAL HIGH (ref 15–41)
Albumin: 2.9 g/dL — ABNORMAL LOW (ref 3.5–5.0)
Alkaline Phosphatase: 128 U/L — ABNORMAL HIGH (ref 38–126)
Anion gap: 10 (ref 5–15)
BUN: 23 mg/dL (ref 8–23)
CO2: 22 mmol/L (ref 22–32)
Calcium: 7.6 mg/dL — ABNORMAL LOW (ref 8.9–10.3)
Chloride: 95 mmol/L — ABNORMAL LOW (ref 98–111)
Creatinine, Ser: 1.12 mg/dL (ref 0.61–1.24)
GFR, Estimated: >60 mL/min (ref >60)
Glucose, Bld: 113 mg/dL — ABNORMAL HIGH (ref 70–99)
Potassium: 3.3 mmol/L — ABNORMAL LOW (ref 3.5–5.1)
Sodium: 127 mmol/L — ABNORMAL LOW (ref 135–145)
Total Bilirubin: 2.1 mg/dL — ABNORMAL HIGH (ref <1.2)
Total Protein: 6.8 g/dL (ref 6.5–8.1)

## 2023-02-19 LAB — MAGNESIUM: Magnesium: 1.9 mg/dL (ref 1.7–2.4)

## 2023-02-19 LAB — SURGICAL PCR SCREEN
MRSA, PCR: NEGATIVE
Staphylococcus aureus: POSITIVE — AB

## 2023-02-19 LAB — PHOSPHORUS: Phosphorus: 3.7 mg/dL (ref 2.5–4.6)

## 2023-02-19 MED ORDER — MUPIROCIN 2 % EX OINT
1.0000 | TOPICAL_OINTMENT | Freq: Two times a day (BID) | CUTANEOUS | Status: AC
  Administered 2023-02-19 – 2023-02-24 (×9): 1 via NASAL
  Filled 2023-02-19 (×4): qty 22

## 2023-02-19 MED ORDER — POTASSIUM CHLORIDE CRYS ER 20 MEQ PO TBCR
20.0000 meq | EXTENDED_RELEASE_TABLET | Freq: Once | ORAL | Status: AC
  Administered 2023-02-19: 20 meq via ORAL
  Filled 2023-02-19: qty 1

## 2023-02-19 MED ORDER — VITAMIN B-12 1000 MCG PO TABS
1000.0000 ug | ORAL_TABLET | Freq: Every day | ORAL | Status: DC
  Administered 2023-02-19 – 2023-02-25 (×6): 1000 ug via ORAL
  Filled 2023-02-19 (×6): qty 1

## 2023-02-19 MED ORDER — ENSURE PRE-SURGERY PO LIQD
296.0000 mL | Freq: Once | ORAL | Status: AC
  Administered 2023-02-19: 296 mL via ORAL
  Filled 2023-02-19: qty 296

## 2023-02-19 MED ORDER — DILTIAZEM HCL ER COATED BEADS 120 MG PO CP24
120.0000 mg | ORAL_CAPSULE | Freq: Every day | ORAL | Status: DC
  Administered 2023-02-20 – 2023-02-25 (×5): 120 mg via ORAL
  Filled 2023-02-19 (×5): qty 1

## 2023-02-19 MED ORDER — SODIUM CHLORIDE 0.9 % IV SOLN
INTRAVENOUS | Status: AC
  Administered 2023-02-19 – 2023-02-20 (×2): 100 mL/h via INTRAVENOUS

## 2023-02-19 MED ORDER — MAGNESIUM SULFATE IN D5W 1-5 GM/100ML-% IV SOLN
1.0000 g | Freq: Once | INTRAVENOUS | Status: AC
  Administered 2023-02-19: 1 g via INTRAVENOUS
  Filled 2023-02-19: qty 100

## 2023-02-19 MED ORDER — CHLORHEXIDINE GLUCONATE 4 % EX SOLN
60.0000 mL | Freq: Once | CUTANEOUS | Status: AC
  Administered 2023-02-20: 4 via TOPICAL
  Filled 2023-02-19: qty 60

## 2023-02-19 MED ORDER — POTASSIUM CHLORIDE CRYS ER 20 MEQ PO TBCR
40.0000 meq | EXTENDED_RELEASE_TABLET | Freq: Once | ORAL | Status: AC
  Administered 2023-02-19: 40 meq via ORAL
  Filled 2023-02-19: qty 2

## 2023-02-19 NOTE — Plan of Care (Signed)

## 2023-02-19 NOTE — Progress Notes (Signed)
Orthopedics Progress Note  Subjective: Patient seen in rounds this morning. He is complaining of left hip and right shoulder pain after fall when he blacked out.   Objective:  Vitals:   02/18/23 2143 02/19/23 0415  BP: 108/77 103/70  Pulse: (!) 108 87  Resp: 20 18  Temp: 98.9 F (37.2 C) 98.8 F (37.1 C)  SpO2: 93% 93%    General: Awake and alert  Musculoskeletal: Left hip with pain with gentle PROM of the hip. No pain with AROM of the ankle Right shoulder swollen and pain with PROM and AROM. Elbow and wrist nontender. Pain free AROM of the left UE and the Right LE. T and L spine are nontender. Neurovascularly intact  Lab Results  Component Value Date   WBC 11.4 (H) 02/19/2023   HGB 9.3 (L) 02/19/2023   HCT 27.5 (L) 02/19/2023   MCV 103.0 (H) 02/19/2023   PLT 79 (L) 02/19/2023       Component Value Date/Time   NA 127 (L) 02/19/2023 0516   K 3.3 (L) 02/19/2023 0516   CL 95 (L) 02/19/2023 0516   CO2 22 02/19/2023 0516   GLUCOSE 113 (H) 02/19/2023 0516   BUN 23 02/19/2023 0516   CREATININE 1.12 02/19/2023 0516   CALCIUM 7.6 (L) 02/19/2023 0516   GFRNONAA >60 02/19/2023 0516   GFRAA >60 09/13/2019 2007    Lab Results  Component Value Date   INR 1.0 05/17/2021   INR 1.0 09/13/2019   INR 1.1 05/15/2019    Assessment/Plan: Patient with displaced left hip fracture requiring arthroplasty. Dr Charlann Boxer aware and planning to do tomorrow.  Right proximal humerus fracture - CT scan pending, however based upon plain films, this patient will likely need surgery for the shoulder. Plan to follow for the shoulder. NPO after MN  Almedia Balls. Ranell Patrick, MD 02/19/2023 10:50 AM

## 2023-02-19 NOTE — Progress Notes (Signed)
[facility] PROGRESS NOTE  Steven Bradley (DOB: 02/24/1946) WGN:[id number] PCP: [facility]  Brief Narrative: Steven Bradley is a 77 y.o. male with a history of seizure disorder, HTN, HLD, prostate CA, EtOH abuse and alcohol-associated hepatic ascites who presented to the ED on 02/17/2023 after a fall. His spouse reported hearing a loud noise at the bathroom in their RV, where she found him on the floor, this happened around 9:30 AM, patient reports he struck his face on the toilet, had bleeding and swelling of his lips, there was no loss of consciousness noted, he refused transport by EMS, wife assisted him to the couch where an hour later where he was witnessed to have seizures, lasted for 7 to 8 minutes, it was witnessed by brother-in-law, will describe generalized jerking and shaking, reports patient did not have seizures for last 5-month, and he has been compliant with his meds Keppra,.  Patient with left hip pain, wife thought secondary to gout (x-ray was significant for femur fracture), as well he does endorse right shoulder pain as well which they think it is due to gout as well( shoulder x-ray still pending). -In ED patient was noted to be with significant lip bruise, tongue bleed, he was noted to be in new onset A-fib with RVR, requiring Cardizem drip, he had severe electrolyte derangement loading sodium of 133, potassium of 2.5, magnesium of 0.8, phosphorus of 2.2, anion gap of 19, AST of 247, ALT of 74, total bili of 3.5, his MCV was 100.5, and platelet count was 99K, his x-ray significant for left hip fracture as well, [facility] hospitalist consulted to admit.  He was transported to [facility] for orthopedic surgery. Hospitalization complicated by new onset AFib.   Subjective: Pain in right arm and left leg is stable. Mentating at baseline, spouse and son at bedside.  Objective: BP 124/79   Pulse 88   Temp 98.9 F (37.2 C) (Oral)   Resp 16   Ht 6\' 6"  (1.981 m)   Wt 95 kg    SpO2 92%   BMI 24.20 kg/m   Gen: No distress HEENT: Edematous lower lip with abrasion, left sided tongue laceration is hemostatic, Dependently settling ecchymoses. Pulm: Clear, nonlabored  CV: RRR, no MRG GI: Soft, NT, ND, +BS  Neuro: Alert and oriented. No new focal deficits. Ext: RUE and LLE not tested due to pain. Distal extremities both have normal distal sensation, motor function and palpable pulses.  Assessment & Plan: Principal Problem:   Seizure (HCC) Active Problems:   Hypokalemia   Hyponatremia   Thrombocytopenia (HCC)   Essential hypertension   Elevated LFTs   Hyperlipemia   GERD (gastroesophageal reflux disease)   Macrocytosis   Gout of multiple sites  Breakthrough seizure:  - Loaded with, and increased keppra dose per neurology, [name], no EEG required at this time.  - Seizure precautions - Will follow up with Dr. Karel Jarvis at [facility].   Right humerus fracture: Appears complicated by CT, closed.  - Orthopedics, Dr. Ranell Patrick, following and anticipated surgical management will be necessary.   Left femur fracture:  - Orthopedics to take to OR 12/8, NPO p MN.  - No further cardiology evaluation is necessary prior to planned procedure. Preoperative ECG shows NSR with normal axis, no ischemic ST deviations. QTc is . We will supplement K and Mg and avoid prolonging agents. Will check type and screen given his low platelets and already declining hgb.   New atrial fibrillation with RVR: Resolved, back in  NSR. Echo showed LVEF 60-65%, mild LVH, G1DD, no WMA. low-normal RVSF. Normal biatrial size. - Continue oral diltiazem, consolidate in AM.  - Holding anticoagulation with severity of fall, EtOH use, and thrombocytopenia.   EtOH abuse: Recent history per spouse is less drinking overall, not daily.  - Continue low dose librium taper. CIWA scores reassuring for now, will continue monitoring.  - MVM, folate, thiamine per protocol  Chronic gout: No exacerbation noted at  this time.  - Continue home allopurinol.   HTN, HLD: Continue meds as ordered.    Hypokalemia - Repeat supplement   Hypomagnesemia:  - Give 1g and monitor   Hypophosphatemia: Resolved.   Depression: Continue medications as per pt's spouse.    Hyponatremia: Continue NS   Transaminitis - LFT elevation consistent with chronic alcohol abuse   GERD: PPI   Macrocytosis - B12 low normal at 208, will supplement - folate 10.8   Tyrone Nine, MD [facility] [email or web address] 02/19/2023, 3:42 PM

## 2023-02-20 ENCOUNTER — Inpatient Hospital Stay (HOSPITAL_COMMUNITY): Payer: No Typology Code available for payment source | Admitting: Certified Registered"

## 2023-02-20 ENCOUNTER — Inpatient Hospital Stay (HOSPITAL_COMMUNITY): Payer: No Typology Code available for payment source

## 2023-02-20 ENCOUNTER — Encounter (HOSPITAL_COMMUNITY)
Admission: EM | Disposition: A | Discharge status: Skilled Nursing Facility | Payer: Self-pay | Source: Home / Self Care | Attending: Family Medicine

## 2023-02-20 ENCOUNTER — Other Ambulatory Visit: Payer: Self-pay

## 2023-02-20 DIAGNOSIS — R569 Unspecified convulsions: Secondary | ICD-10-CM | POA: Diagnosis not present

## 2023-02-20 DIAGNOSIS — S72002A Fracture of unspecified part of neck of left femur, initial encounter for closed fracture: Secondary | ICD-10-CM | POA: Diagnosis not present

## 2023-02-20 DIAGNOSIS — Z96642 Presence of left artificial hip joint: Secondary | ICD-10-CM

## 2023-02-20 HISTORY — PX: TOTAL HIP ARTHROPLASTY: SHX124

## 2023-02-20 LAB — CBC WITH DIFFERENTIAL/PLATELET
Abs Immature Granulocytes: 0.04 10*3/uL (ref 0.00–0.07)
Basophils Absolute: 0 10*3/uL (ref 0.0–0.1)
Basophils Relative: 0 %
Eosinophils Absolute: 0.2 10*3/uL (ref 0.0–0.5)
Eosinophils Relative: 3 %
HCT: 26.1 % — ABNORMAL LOW (ref 39.0–52.0)
Hemoglobin: 8.9 g/dL — ABNORMAL LOW (ref 13.0–17.0)
Immature Granulocytes: 1 %
Lymphocytes Relative: 11 %
Lymphs Abs: 0.8 10*3/uL (ref 0.7–4.0)
MCH: 34.9 pg — ABNORMAL HIGH (ref 26.0–34.0)
MCHC: 34.1 g/dL (ref 30.0–36.0)
MCV: 102.4 fL — ABNORMAL HIGH (ref 80.0–100.0)
Monocytes Absolute: 1 10*3/uL (ref 0.1–1.0)
Monocytes Relative: 12 %
Neutro Abs: 5.8 10*3/uL (ref 1.7–7.7)
Neutrophils Relative %: 73 %
Platelets: 82 10*3/uL — ABNORMAL LOW (ref 150–400)
RBC: 2.55 MIL/uL — ABNORMAL LOW (ref 4.22–5.81)
RDW: 13.2 % (ref 11.5–15.5)
WBC: 7.9 10*3/uL (ref 4.0–10.5)
nRBC: 0 % (ref 0.0–0.2)

## 2023-02-20 LAB — COMPREHENSIVE METABOLIC PANEL
ALT: 77 U/L — ABNORMAL HIGH (ref 0–44)
AST: 192 U/L — ABNORMAL HIGH (ref 15–41)
Albumin: 2.7 g/dL — ABNORMAL LOW (ref 3.5–5.0)
Alkaline Phosphatase: 172 U/L — ABNORMAL HIGH (ref 38–126)
Anion gap: 8 (ref 5–15)
BUN: 15 mg/dL (ref 8–23)
CO2: 23 mmol/L (ref 22–32)
Calcium: 8.2 mg/dL — ABNORMAL LOW (ref 8.9–10.3)
Chloride: 97 mmol/L — ABNORMAL LOW (ref 98–111)
Creatinine, Ser: 0.82 mg/dL (ref 0.61–1.24)
GFR, Estimated: >60 mL/min (ref >60)
Glucose, Bld: 110 mg/dL — ABNORMAL HIGH (ref 70–99)
Potassium: 3.5 mmol/L (ref 3.5–5.1)
Sodium: 128 mmol/L — ABNORMAL LOW (ref 135–145)
Total Bilirubin: 2.6 mg/dL — ABNORMAL HIGH (ref <1.2)
Total Protein: 6.6 g/dL (ref 6.5–8.1)

## 2023-02-20 LAB — PHOSPHORUS: Phosphorus: 2.7 mg/dL (ref 2.5–4.6)

## 2023-02-20 LAB — MAGNESIUM: Magnesium: 1.8 mg/dL (ref 1.7–2.4)

## 2023-02-20 SURGERY — ARTHROPLASTY, HIP, TOTAL, ANTERIOR APPROACH
Anesthesia: General | Site: Hip | Laterality: Left

## 2023-02-20 MED ORDER — POVIDONE-IODINE 10 % EX SWAB: 2.0000 | Freq: Once | CUTANEOUS | Status: DC

## 2023-02-20 MED ORDER — OXYCODONE HCL 5 MG PO TABS: 10.0000 mg | ORAL_TABLET | ORAL | Status: DC | PRN

## 2023-02-20 MED ORDER — CEFAZOLIN SODIUM-DEXTROSE 2-4 GM/100ML-% IV SOLN
INTRAVENOUS | Status: AC
  Filled 2023-02-20: qty 100

## 2023-02-20 MED ORDER — LACTATED RINGERS IV SOLN: INTRAVENOUS | Status: DC | PRN

## 2023-02-20 MED ORDER — HYDROMORPHONE HCL 1 MG/ML IJ SOLN
0.5000 mg | INTRAMUSCULAR | Status: DC | PRN
  Administered 2023-02-20: 1 mg via INTRAVENOUS
  Administered 2023-02-21: 0.5 mg via INTRAVENOUS
  Administered 2023-02-21: 1 mg via INTRAVENOUS
  Filled 2023-02-20 (×3): qty 1

## 2023-02-20 MED ORDER — BUPIVACAINE HCL 0.25 % IJ SOLN
INTRAMUSCULAR | Status: AC
  Filled 2023-02-20: qty 1

## 2023-02-20 MED ORDER — METOCLOPRAMIDE HCL 5 MG PO TABS: 5.0000 mg | ORAL_TABLET | Freq: Three times a day (TID) | ORAL | Status: DC | PRN

## 2023-02-20 MED ORDER — PHENYLEPHRINE HCL-NACL 20-0.9 MG/250ML-% IV SOLN
INTRAVENOUS | Status: DC | PRN
  Administered 2023-02-20: 35 ug/min via INTRAVENOUS

## 2023-02-20 MED ORDER — PHENYLEPHRINE 80 MCG/ML (10ML) SYRINGE FOR IV PUSH (FOR BLOOD PRESSURE SUPPORT)
PREFILLED_SYRINGE | INTRAVENOUS | Status: DC | PRN
  Administered 2023-02-20: 160 ug via INTRAVENOUS

## 2023-02-20 MED ORDER — CHLORHEXIDINE GLUCONATE 4 % EX SOLN
60.0000 mL | Freq: Once | CUTANEOUS | Status: DC
  Filled 2023-02-20 (×2): qty 60

## 2023-02-20 MED ORDER — TRANEXAMIC ACID-NACL 1000-0.7 MG/100ML-% IV SOLN
1000.0000 mg | INTRAVENOUS | Status: AC
  Administered 2023-02-21: 1000 mg via INTRAVENOUS
  Filled 2023-02-20: qty 100

## 2023-02-20 MED ORDER — PROPOFOL 10 MG/ML IV BOLUS
INTRAVENOUS | Status: DC | PRN
  Administered 2023-02-20: 120 mg via INTRAVENOUS

## 2023-02-20 MED ORDER — BUPIVACAINE-EPINEPHRINE 0.25% -1:200000 IJ SOLN
INTRAMUSCULAR | Status: AC
  Filled 2023-02-20: qty 1

## 2023-02-20 MED ORDER — FENTANYL CITRATE PF 50 MCG/ML IJ SOSY: 25.0000 ug | PREFILLED_SYRINGE | INTRAMUSCULAR | Status: DC | PRN

## 2023-02-20 MED ORDER — PROPOFOL 10 MG/ML IV BOLUS
INTRAVENOUS | Status: AC
  Filled 2023-02-20: qty 20

## 2023-02-20 MED ORDER — METHOCARBAMOL 500 MG PO TABS
500.0000 mg | ORAL_TABLET | Freq: Four times a day (QID) | ORAL | Status: DC | PRN
Start: 2023-02-20 — End: 2023-02-25

## 2023-02-20 MED ORDER — ONDANSETRON HCL 4 MG/2ML IJ SOLN
4.0000 mg | Freq: Four times a day (QID) | INTRAMUSCULAR | Status: AC | PRN
  Administered 2023-02-20: 4 mg via INTRAVENOUS

## 2023-02-20 MED ORDER — OXYCODONE HCL 5 MG/5ML PO SOLN: 5.0000 mg | Freq: Once | ORAL | Status: DC | PRN

## 2023-02-20 MED ORDER — MENTHOL 3 MG MT LOZG
1.0000 | LOZENGE | OROMUCOSAL | Status: DC | PRN
  Administered 2023-02-21: 3 mg via ORAL
  Filled 2023-02-20: qty 9

## 2023-02-20 MED ORDER — METHOCARBAMOL 1000 MG/10ML IJ SOLN: 500.0000 mg | Freq: Four times a day (QID) | INTRAMUSCULAR | Status: DC | PRN

## 2023-02-20 MED ORDER — ONDANSETRON HCL 4 MG PO TABS: 4.0000 mg | ORAL_TABLET | Freq: Four times a day (QID) | ORAL | Status: DC | PRN

## 2023-02-20 MED ORDER — ONDANSETRON HCL 4 MG/2ML IJ SOLN
4.0000 mg | Freq: Four times a day (QID) | INTRAMUSCULAR | Status: DC | PRN
  Administered 2023-02-21: 4 mg via INTRAVENOUS
  Filled 2023-02-20: qty 2

## 2023-02-20 MED ORDER — FENTANYL CITRATE (PF) 100 MCG/2ML IJ SOLN
INTRAMUSCULAR | Status: DC | PRN
  Administered 2023-02-20: 50 ug via INTRAVENOUS
  Administered 2023-02-20: 100 ug via INTRAVENOUS

## 2023-02-20 MED ORDER — TRANEXAMIC ACID-NACL 1000-0.7 MG/100ML-% IV SOLN
1000.0000 mg | INTRAVENOUS | Status: AC
  Administered 2023-02-20: 1000 mg via INTRAVENOUS

## 2023-02-20 MED ORDER — TRANEXAMIC ACID-NACL 1000-0.7 MG/100ML-% IV SOLN
1000.0000 mg | Freq: Once | INTRAVENOUS | Status: AC
Start: 2023-02-20 — End: 2023-02-20
  Administered 2023-02-20: 1000 mg via INTRAVENOUS
  Filled 2023-02-20: qty 100

## 2023-02-20 MED ORDER — SODIUM CHLORIDE (PF) 0.9 % IJ SOLN
INTRAMUSCULAR | Status: DC | PRN
  Administered 2023-02-20: 61 mL

## 2023-02-20 MED ORDER — FOLIC ACID 1 MG PO TABS
1.0000 mg | ORAL_TABLET | Freq: Every day | ORAL | Status: DC
  Administered 2023-02-22 – 2023-02-25 (×4): 1 mg via ORAL
  Filled 2023-02-20 (×4): qty 1

## 2023-02-20 MED ORDER — FENTANYL CITRATE PF 50 MCG/ML IJ SOSY
PREFILLED_SYRINGE | INTRAMUSCULAR | Status: AC
  Filled 2023-02-20: qty 3

## 2023-02-20 MED ORDER — ACETAMINOPHEN 10 MG/ML IV SOLN
INTRAVENOUS | Status: AC
  Filled 2023-02-20: qty 100

## 2023-02-20 MED ORDER — CARMEX CLASSIC LIP BALM EX OINT
TOPICAL_OINTMENT | CUTANEOUS | Status: DC | PRN
  Administered 2023-02-20: 1 via TOPICAL
  Filled 2023-02-20 (×2): qty 10

## 2023-02-20 MED ORDER — POTASSIUM CHLORIDE CRYS ER 20 MEQ PO TBCR
20.0000 meq | EXTENDED_RELEASE_TABLET | Freq: Once | ORAL | Status: AC
  Administered 2023-02-20: 20 meq via ORAL
  Filled 2023-02-20: qty 1

## 2023-02-20 MED ORDER — SODIUM CHLORIDE 0.9% FLUSH
10.0000 mL | Freq: Two times a day (BID) | INTRAVENOUS | Status: DC
  Administered 2023-02-20 – 2023-02-24 (×7): 10 mL via INTRAVENOUS

## 2023-02-20 MED ORDER — TRANEXAMIC ACID-NACL 1000-0.7 MG/100ML-% IV SOLN
INTRAVENOUS | Status: AC
  Filled 2023-02-20: qty 100

## 2023-02-20 MED ORDER — ROCURONIUM BROMIDE 10 MG/ML (PF) SYRINGE
PREFILLED_SYRINGE | INTRAVENOUS | Status: DC | PRN
  Administered 2023-02-20: 50 mg via INTRAVENOUS
  Administered 2023-02-20: 30 mg via INTRAVENOUS

## 2023-02-20 MED ORDER — METOCLOPRAMIDE HCL 5 MG/ML IJ SOLN
5.0000 mg | Freq: Three times a day (TID) | INTRAMUSCULAR | Status: DC | PRN
Start: 2023-02-20 — End: 2023-02-25

## 2023-02-20 MED ORDER — ASPIRIN 81 MG PO CHEW
81.0000 mg | CHEWABLE_TABLET | Freq: Two times a day (BID) | ORAL | Status: DC
  Administered 2023-02-20 – 2023-02-24 (×7): 81 mg via ORAL
  Filled 2023-02-20 (×7): qty 1

## 2023-02-20 MED ORDER — FENTANYL CITRATE (PF) 100 MCG/2ML IJ SOLN
INTRAMUSCULAR | Status: AC
  Filled 2023-02-20: qty 2

## 2023-02-20 MED ORDER — DEXAMETHASONE SODIUM PHOSPHATE 10 MG/ML IJ SOLN
INTRAMUSCULAR | Status: DC | PRN
  Administered 2023-02-20: 6 mg via INTRAVENOUS

## 2023-02-20 MED ORDER — BISACODYL 10 MG RE SUPP: 10.0000 mg | Freq: Every day | RECTAL | Status: DC | PRN

## 2023-02-20 MED ORDER — ALUM & MAG HYDROXIDE-SIMETH 200-200-20 MG/5ML PO SUSP
30.0000 mL | ORAL | Status: DC | PRN
Start: 2023-02-20 — End: 2023-02-25

## 2023-02-20 MED ORDER — PHENOL 1.4 % MT LIQD: 1.0000 | OROMUCOSAL | Status: DC | PRN

## 2023-02-20 MED ORDER — 0.9 % SODIUM CHLORIDE (POUR BTL) OPTIME
TOPICAL | Status: DC | PRN
  Administered 2023-02-20: 1000 mL

## 2023-02-20 MED ORDER — KETOROLAC TROMETHAMINE 30 MG/ML IJ SOLN
INTRAMUSCULAR | Status: AC
  Filled 2023-02-20: qty 1

## 2023-02-20 MED ORDER — DIPHENHYDRAMINE HCL 12.5 MG/5ML PO ELIX: 12.5000 mg | ORAL_SOLUTION | ORAL | Status: DC | PRN

## 2023-02-20 MED ORDER — TRANEXAMIC ACID-NACL 1000-0.7 MG/100ML-% IV SOLN: 1000.0000 mg | INTRAVENOUS | Status: DC

## 2023-02-20 MED ORDER — SODIUM CHLORIDE (PF) 0.9 % IJ SOLN
INTRAMUSCULAR | Status: AC
  Filled 2023-02-20: qty 50

## 2023-02-20 MED ORDER — ROCURONIUM BROMIDE 10 MG/ML (PF) SYRINGE
PREFILLED_SYRINGE | INTRAVENOUS | Status: AC
  Filled 2023-02-20: qty 10

## 2023-02-20 MED ORDER — CEFAZOLIN SODIUM-DEXTROSE 2-4 GM/100ML-% IV SOLN: 2.0000 g | INTRAVENOUS | Status: DC

## 2023-02-20 MED ORDER — LIDOCAINE HCL (PF) 2 % IJ SOLN
INTRAMUSCULAR | Status: AC
  Filled 2023-02-20: qty 5

## 2023-02-20 MED ORDER — PHENYLEPHRINE 80 MCG/ML (10ML) SYRINGE FOR IV PUSH (FOR BLOOD PRESSURE SUPPORT)
PREFILLED_SYRINGE | INTRAVENOUS | Status: AC
  Filled 2023-02-20: qty 10

## 2023-02-20 MED ORDER — ACETAMINOPHEN 10 MG/ML IV SOLN
INTRAVENOUS | Status: DC | PRN
  Administered 2023-02-20: 1000 mg via INTRAVENOUS

## 2023-02-20 MED ORDER — DEXAMETHASONE SODIUM PHOSPHATE 10 MG/ML IJ SOLN
INTRAMUSCULAR | Status: AC
  Filled 2023-02-20: qty 1

## 2023-02-20 MED ORDER — LIDOCAINE HCL (CARDIAC) PF 100 MG/5ML IV SOSY
PREFILLED_SYRINGE | INTRAVENOUS | Status: DC | PRN
  Administered 2023-02-20: 60 mg via INTRAVENOUS

## 2023-02-20 MED ORDER — SENNA 8.6 MG PO TABS
2.0000 | ORAL_TABLET | Freq: Every day | ORAL | Status: DC
  Administered 2023-02-20 – 2023-02-23 (×4): 17.2 mg via ORAL
  Filled 2023-02-20 (×4): qty 2

## 2023-02-20 MED ORDER — SODIUM CHLORIDE 0.9 % IV SOLN: INTRAVENOUS | Status: AC

## 2023-02-20 MED ORDER — ACETAMINOPHEN 500 MG PO TABS
1000.0000 mg | ORAL_TABLET | Freq: Four times a day (QID) | ORAL | Status: AC
  Administered 2023-02-20 – 2023-02-24 (×13): 1000 mg via ORAL
  Filled 2023-02-20 (×14): qty 2

## 2023-02-20 MED ORDER — CEFAZOLIN SODIUM-DEXTROSE 2-4 GM/100ML-% IV SOLN
2.0000 g | Freq: Four times a day (QID) | INTRAVENOUS | Status: AC
  Administered 2023-02-20 (×2): 2 g via INTRAVENOUS
  Filled 2023-02-20 (×2): qty 100

## 2023-02-20 MED ORDER — STERILE WATER FOR IRRIGATION IR SOLN
Status: DC | PRN
  Administered 2023-02-20: 1000 mL

## 2023-02-20 MED ORDER — CEFAZOLIN SODIUM-DEXTROSE 2-4 GM/100ML-% IV SOLN
2.0000 g | INTRAVENOUS | Status: AC
  Administered 2023-02-21: 2 g via INTRAVENOUS
  Filled 2023-02-20: qty 100

## 2023-02-20 MED ORDER — PHENYLEPHRINE 80 MCG/ML (10ML) SYRINGE FOR IV PUSH (FOR BLOOD PRESSURE SUPPORT): PREFILLED_SYRINGE | INTRAVENOUS | Status: DC | PRN

## 2023-02-20 MED ORDER — OXYCODONE HCL 5 MG PO TABS: 5.0000 mg | ORAL_TABLET | Freq: Once | ORAL | Status: DC | PRN

## 2023-02-20 MED ORDER — OXYCODONE HCL 5 MG PO TABS
2.5000 mg | ORAL_TABLET | ORAL | Status: DC | PRN
  Administered 2023-02-20 – 2023-02-25 (×7): 5 mg via ORAL
  Filled 2023-02-20 (×7): qty 1

## 2023-02-20 MED ORDER — CEFAZOLIN SODIUM-DEXTROSE 2-4 GM/100ML-% IV SOLN
2.0000 g | INTRAVENOUS | Status: AC
  Administered 2023-02-20: 2 g via INTRAVENOUS

## 2023-02-20 MED ORDER — ONDANSETRON HCL 4 MG/2ML IJ SOLN
INTRAMUSCULAR | Status: AC
  Filled 2023-02-20: qty 2

## 2023-02-20 MED ORDER — PHENYLEPHRINE HCL-NACL 20-0.9 MG/250ML-% IV SOLN
INTRAVENOUS | Status: AC
  Filled 2023-02-20: qty 250

## 2023-02-20 MED ORDER — SUGAMMADEX SODIUM 200 MG/2ML IV SOLN
INTRAVENOUS | Status: DC | PRN
  Administered 2023-02-20: 200 mg via INTRAVENOUS

## 2023-02-20 MED ORDER — POLYETHYLENE GLYCOL 3350 17 G PO PACK
17.0000 g | PACK | Freq: Two times a day (BID) | ORAL | Status: DC
  Administered 2023-02-20 – 2023-02-25 (×8): 17 g via ORAL
  Filled 2023-02-20 (×9): qty 1

## 2023-02-20 MED ORDER — DEXAMETHASONE SODIUM PHOSPHATE 10 MG/ML IJ SOLN
10.0000 mg | Freq: Once | INTRAMUSCULAR | Status: AC
  Administered 2023-02-21: 10 mg via INTRAVENOUS
  Filled 2023-02-20: qty 1

## 2023-02-20 SURGICAL SUPPLY — 38 items
BAG COUNTER SPONGE SURGICOUNT (BAG) IMPLANT
BAG ZIPLOCK 12X15 (MISCELLANEOUS) IMPLANT
BALL HIP ARTICU EZE 36 8.5 (Hips) IMPLANT
BLADE SAG 18X100X1.27 (BLADE) ×1 IMPLANT
COVER PERINEAL POST (MISCELLANEOUS) ×1 IMPLANT
COVER SURGICAL LIGHT HANDLE (MISCELLANEOUS) ×1 IMPLANT
CUP ACET PINNCLE SECTR II 60MM (Hips) IMPLANT
DERMABOND ADVANCED .7 DNX12 (GAUZE/BANDAGES/DRESSINGS) ×1 IMPLANT
DRAPE FOOT SWITCH (DRAPES) ×1 IMPLANT
DRAPE STERI IOBAN 125X83 (DRAPES) ×1 IMPLANT
DRAPE U-SHAPE 47X51 STRL (DRAPES) ×2 IMPLANT
DRESSING AQUACEL AG SP 3.5X10 (GAUZE/BANDAGES/DRESSINGS) ×1 IMPLANT
DRSG AQUACEL AG SP 3.5X10 (GAUZE/BANDAGES/DRESSINGS) ×1
DURAPREP 26ML APPLICATOR (WOUND CARE) ×1 IMPLANT
ELECT REM PT RETURN 15FT ADLT (MISCELLANEOUS) ×1 IMPLANT
GLOVE BIO SURGEON STRL SZ 6 (GLOVE) ×1 IMPLANT
GLOVE BIOGEL PI IND STRL 6.5 (GLOVE) ×1 IMPLANT
GLOVE BIOGEL PI IND STRL 7.5 (GLOVE) ×1 IMPLANT
GLOVE ORTHO TXT STRL SZ7.5 (GLOVE) ×2 IMPLANT
GOWN STRL REUS W/ TWL LRG LVL3 (GOWN DISPOSABLE) ×2 IMPLANT
HIP BALL ARTICU EZE 36 8.5 (Hips) ×1 IMPLANT
HOLDER FOLEY CATH W/STRAP (MISCELLANEOUS) ×1 IMPLANT
KIT TURNOVER KIT A (KITS) IMPLANT
LINER NEUTRAL 58X36MM PLUS4 IMPLANT
NDL SAFETY ECLIPSE 18X1.5 (NEEDLE) IMPLANT
PACK ANTERIOR HIP CUSTOM (KITS) ×1 IMPLANT
PINNACLE SECTOR II CUP 60MM (Hips) ×1 IMPLANT
SCREW 6.5MMX40MM (Screw) IMPLANT
STEM FEM ACTIS HIGH SZ12 (Stem) IMPLANT
SUT MNCRL AB 4-0 PS2 18 (SUTURE) ×1 IMPLANT
SUT STRATAFIX 0 PDS 27 VIOLET (SUTURE) ×1
SUT VIC AB 1 CT1 36 (SUTURE) ×3 IMPLANT
SUT VIC AB 2-0 CT1 TAPERPNT 27 (SUTURE) ×2 IMPLANT
SUTURE STRATFX 0 PDS 27 VIOLET (SUTURE) ×1 IMPLANT
SYR 3ML LL SCALE MARK (SYRINGE) IMPLANT
TRAY FOLEY MTR SLVR 16FR STAT (SET/KITS/TRAYS/PACK) IMPLANT
TUBE SUCTION HIGH CAP CLEAR NV (SUCTIONS) ×1 IMPLANT
WATER STERILE IRR 1000ML POUR (IV SOLUTION) ×1 IMPLANT

## 2023-02-20 NOTE — Anesthesia Preprocedure Evaluation (Signed)
Anesthesia Evaluation  Patient identified by MRN, date of birth, ID band Patient awake    Reviewed: Allergy & Precautions, H&P , NPO status , Patient's Chart, lab work & pertinent test results  Airway Mallampati: II   Neck ROM: full    Dental   Pulmonary neg pulmonary ROS   breath sounds clear to auscultation       Cardiovascular hypertension, + dysrhythmias Atrial Fibrillation  Rhythm:regular Rate:Normal     Neuro/Psych Seizures -,  PSYCHIATRIC DISORDERS Anxiety        GI/Hepatic ,GERD  ,,  Endo/Other    Renal/GU      Musculoskeletal  (+) Arthritis ,    Abdominal   Peds  Hematology  (+) Blood dyscrasia, anemia Hemoglobin 8.9   Anesthesia Other Findings   Reproductive/Obstetrics                             Anesthesia Physical Anesthesia Plan  ASA: 3  Anesthesia Plan: General   Post-op Pain Management:    Induction: Intravenous  PONV Risk Score and Plan: 2 and Ondansetron, Dexamethasone and Treatment may vary due to age or medical condition  Airway Management Planned: Oral ETT  Additional Equipment:   Intra-op Plan:   Post-operative Plan: Extubation in OR  Informed Consent: I have reviewed the patients History and Physical, chart, labs and discussed the procedure including the risks, benefits and alternatives for the proposed anesthesia with the patient or authorized representative who has indicated his/her understanding and acceptance.     Dental advisory given  Plan Discussed with: CRNA, Anesthesiologist and Surgeon  Anesthesia Plan Comments:        Anesthesia Quick Evaluation

## 2023-02-20 NOTE — Progress Notes (Signed)
Case dw Dr. Ranell Patrick, covering for our group over the weekend.  Agree with need for surgery on right shoulder.  Will pan for reverse arthroplsty due to head involvement and discplacement in this 77 yo M.  NPO tonight for surgery tomorrow afternoon.

## 2023-02-20 NOTE — Anesthesia Procedure Notes (Signed)
Procedure Name: Intubation Date/Time: 02/20/2023 9:35 AM  Performed by: Oletha Cruel, CRNAPre-anesthesia Checklist: Patient identified, Emergency Drugs available, Suction available and Patient being monitored Patient Re-evaluated:Patient Re-evaluated prior to induction Oxygen Delivery Method: Circle system utilized Preoxygenation: Pre-oxygenation with 100% oxygen Induction Type: IV induction Ventilation: Mask ventilation without difficulty Laryngoscope Size: Mac and 4 Grade View: Grade III Tube type: Oral Tube size: 7.5 mm Number of attempts: 1 Airway Equipment and Method: Stylet Placement Confirmation: ETT inserted through vocal cords under direct vision, positive ETCO2, CO2 detector and breath sounds checked- equal and bilateral Secured at: 23 cm Tube secured with: Tape Dental Injury: Teeth and Oropharynx as per pre-operative assessment  Comments: Atraumatic intubation. Lips swollen Purple in color. With abrasions on upper and lower lips. Lips and teeth remain in preoperative condition.

## 2023-02-20 NOTE — Transfer of Care (Signed)
Immediate Anesthesia Transfer of Care Note  Patient: Steven Bradley  Procedure(s) Performed: TOTAL HIP ARTHROPLASTY ANTERIOR APPROACH (Left: Hip)  Patient Location: PACU  Anesthesia Type:General  Level of Consciousness: drowsy and patient cooperative  Airway & Oxygen Therapy: Patient Spontanous Breathing and Patient connected to nasal cannula oxygen  Post-op Assessment: Report given to RN and Post -op Vital signs reviewed and stable  Post vital signs: Reviewed and stable  Last Vitals:  Vitals Value Taken Time  BP 133/81 02/20/23 1130  Temp 36.4 C 02/20/23 1125  Pulse 92 02/20/23 1131  Resp 18 02/20/23 1131  SpO2 100 % 02/20/23 1131  Vitals shown include unfiled device data.  Last Pain:  Vitals:   02/20/23 1125  TempSrc:   PainSc: 0-No pain      Patients Stated Pain Goal: 2 (02/20/23 0339)  Complications: No notable events documented.

## 2023-02-20 NOTE — Interval H&P Note (Signed)
History and Physical Interval Note:  02/20/2023 9:23 AM  Steven Bradley  has presented today for surgery, with the diagnosis of left femoral neck fracture.  The various methods of treatment have been discussed with the patient and family. After consideration of risks, benefits and other options for treatment, the patient has consented to  Procedure(s): TOTAL HIP ARTHROPLASTY ANTERIOR APPROACH (Left) as a surgical intervention.  The patient's history has been reviewed, patient examined, no change in status, stable for surgery.  I have reviewed the patient's chart and labs.  Questions were answered to the patient's satisfaction.     Shelda Pal

## 2023-02-20 NOTE — Progress Notes (Signed)
[facility] PROGRESS NOTE  Steven Bradley (DOB: 1945/10/30) YNW:[id number] PCP: [facility]  Brief Narrative: Steven Bradley is a 77 y.o. male with a history of seizure disorder, HTN, HLD, prostate CA, EtOH abuse and alcohol-associated hepatic ascites who presented to the ED on 02/17/2023 after a fall. His spouse reported hearing a loud noise at the bathroom in their RV, where she found him on the floor, this happened around 9:30 AM, patient reports he struck his face on the toilet, had bleeding and swelling of his lips, there was no loss of consciousness noted, he refused transport by EMS, wife assisted him to the couch where an hour later where he was witnessed to have seizures, lasted for 7 to 8 minutes, it was witnessed by brother-in-law, will describe generalized jerking and shaking, reports patient did not have seizures for last 57-month, and he has been compliant with his meds Keppra,.  Patient with left hip pain, wife thought secondary to gout (x-ray was significant for femur fracture), as well he does endorse right shoulder pain as well which they think it is due to gout as well( shoulder x-ray still pending). -In ED patient was noted to be with significant lip bruise, tongue bleed, he was noted to be in new onset A-fib with RVR, requiring Cardizem drip, he had severe electrolyte derangement loading sodium of 133, potassium of 2.5, magnesium of 0.8, phosphorus of 2.2, anion gap of 19, AST of 247, ALT of 74, total bili of 3.5, his MCV was 100.5, and platelet count was 99K, his x-ray significant for left hip fracture as well, [facility]ist consulted to admit.  He was transported to [facility] for orthopedic surgery. Hospitalization complicated by new onset AFib.   Subjective: Seen postoperatively, pain controlled, feels fine. Has been alert and talkative, denies any concerns. Spouse at bedside.  Objective: BP (!) 88/71 (BP Location: Left Arm)   Pulse 87   Temp 97.9 F (36.6 C)    Resp 20   Ht 6\' 6"  (1.981 m)   Wt 95 kg   SpO2 100%   BMI 24.20 kg/m   Gen: No distress Pulm: Clear, nonlabored  CV: RRR, no MRG, NSR on monitor/tele GI: Soft, NT, ND, +BS Neuro: Snoring but roused and alert and oriented, no tremor. No new focal deficits. Ext: Warm, and dry with brisk cap refill. RUE in sling. Stable ecchymoses.   Assessment & Plan: Principal Problem:   Seizure (HCC) Active Problems:   Hypokalemia   Hyponatremia   Thrombocytopenia (HCC)   Essential hypertension   Elevated LFTs   Hyperlipemia   GERD (gastroesophageal reflux disease)   Macrocytosis   Gout of multiple sites   S/P total left hip arthroplasty  Breakthrough seizure:  - Loaded with, and increased keppra dose per neurology, [name], no EEG required at this time. Continue 1,250mg  BID - Seizure precautions - Will follow up with Dr. Karel Jarvis at [facility].   Right humerus fracture: Appears complicated by CT, closed.  - Orthopedics, Dr. Ranell Patrick, following and anticipates surgery 12/9.   Left femur fracture: s/p anterior-approach THA 12/8 with Dr. Charlann Boxer - Postoperative pain control, weight bearing, wound care, and VTE ppx per orthopedics.  - Will obtain PT/OT evaluations, anticipate rehab requirement.  ABLA and thrombocytopenia:  - Type and screen is UTD. Monitor daily.   New, paroxysmal atrial fibrillation with RVR: Resolved, back in NSR. Echo showed LVEF 60-65%, mild LVH, G1DD, no WMA. low-normal RVSF. Normal biatrial size. - Continue oral diltiazem, consolidated -  Holding anticoagulation with severity of fall, EtOH use, and anemia/thrombocytopenia.  EtOH abuse: Recent history per spouse is less drinking overall, not daily.  - Continue low dose librium taper. CIWA scores reassuring for now, will continue monitoring.  - MVM, folate, thiamine per protocol  Chronic gout: No exacerbation noted at this time.  - Continue home allopurinol.   HTN, HLD: Continue meds as ordered.    Hypokalemia - Repeat  supplement   Hypomagnesemia: Improved, monitoring.   Hypophosphatemia: Resolved.   Depression: Continue medications as per pt's spouse.    Hyponatremia: Continue NS until taking adequate po.   Transaminitis - LFT elevation consistent with chronic alcohol abuse   GERD: PPI   Macrocytosis - B12 low normal at 208, will supplement - folate 10.8   Tyrone Nine, MD [facility] [email or web address] 02/20/2023, 3:09 PM

## 2023-02-20 NOTE — Op Note (Addendum)
NAME:  Steven Bradley                ACCOUNT NO.: [id number]      MEDICAL RECORD NO.: [id number]      FACILITY:  [facility]      PHYSICIAN:  Shelda Pal  DATE OF BIRTH:  30-Jul-1945     DATE OF PROCEDURE:  02/20/2023                                 OPERATIVE REPORT         PREOPERATIVE DIAGNOSIS: Left  hip displaced femoral neck fracture   POSTOPERATIVE DIAGNOSIS:  Left hip displaced femoral neck fracture     PROCEDURE:  Left total hip replacement through an anterior approach   utilizing DePuy THR system, component size 60 mm pinnacle cup, a size 36+4 neutral   Altrex liner, a size 12 Hi Actis stem with a 36+8.5 Articuleze metal head ball.      SURGEON:  Madlyn Frankel. Charlann Boxer, M.D.      ASSISTANT:  Rosalene Billings, PA-C     ANESTHESIA:  General.      SPECIMENS:  None.      COMPLICATIONS:  None.      BLOOD LOSS:  800 cc     DRAINS:  None.      INDICATION OF THE PROCEDURE:  Steven Bradley is a 77 y.o. male who unfortunately had a ground-level fall.  At the time of his fall he injured his left hip and right shoulder.  He was brought to [facility] emergency room.  Radiographs revealed a displaced left femoral neck fracture in addition to a displaced right proximal humerus fracture.  He was admitted to the hospitalist service for medical optimization.  Orthopedics was consulted for management.  I was asked to be involved for management of his left femoral neck fracture.  Based on his age as well as radiographic findings of osteoarthritis total hip replacement is recommended for management of his hip replacement.  Risks of infection DVT dislocation neurovascular injury and the need for future surgeries are noted.  Indications for improved function and pain relief noted.  His right shoulder will be managed later this week.  Consent was obtained for the benefit of pain relief and management of his functional quality life for his left hip.     PROCEDURE IN DETAIL:  The  patient was brought to operative theater.   Once adequate anesthesia, preoperative antibiotics, 2 gm of Ancef, 1 gm of Tranexamic Acid, and 10 mg of Decadron were administered, the patient was positioned supine on the [employer] table.  Once the patient was safely positioned with adequate padding of boney prominences we predraped out the hip, and used fluoroscopy to confirm orientation of the pelvis.      The left hip was then prepped and draped from proximal iliac crest to   mid thigh with a shower curtain technique.      Time-out was performed identifying the patient, planned procedure, and the appropriate extremity.     An incision was then made 2 cm lateral to the   anterior superior iliac spine extending over the orientation of the   tensor fascia lata muscle and sharp dissection was carried down to the   fascia of the muscle.      The fascia was then incised.  The muscle belly was identified and swept  laterally and retractor placed along the superior neck.  Following   cauterization of the circumflex vessels and removing some pericapsular   fat, a second cobra retractor was placed on the inferior neck.  A T-capsulotomy was made along the line of the   superior neck to the trochanteric fossa, then extended proximally and   distally.  Tag sutures were placed and the retractors were then placed   intracapsular.  We then identified the trochanteric fossa and   orientation of my neck cut and then made a neck osteotomy with the femur on traction.  The fractured femoral neck segment and the femoral head were removed without difficulty or complication.  Traction was let   off and retractors were placed posterior and anterior around the   acetabulum.      The labrum and foveal tissue were debrided.  I began reaming with a 52 mm   reamer and reamed up to 59 mm reamer with good bony bed preparation and a 60 mm  cup was chosen.  The final 60 mm Pinnacle cup was then impacted under fluoroscopy to  confirm the depth of penetration and orientation with respect to   Abduction and forward flexion.  A screw was placed into the ilium followed by the hole eliminator.  The final   36+4 neutral Altrex liner was impacted with good visualized rim fit.  The cup was positioned anatomically within the acetabular portion of the pelvis.      At this point, the femur was rolled to 100 degrees.  Further capsule was   released off the inferior aspect of the femoral neck.  I then   released the superior capsule proximally.  With the leg in a neutral position the hook was placed laterally   along the femur under the vastus lateralis origin and elevated manually and then held in position using the hook attachment on the bed.  The leg was then extended and adducted with the leg rolled to 100   degrees of external rotation.  Retractors were placed along the medial calcar and posteriorly over the greater trochanter.  Once the proximal femur was fully   exposed, I used a box osteotome to set orientation.  I then began   broaching with the starting chili pepper broach and passed this by hand and then broached up to 12.  With the 12 broach in place I chose a high offset neck and did several trial reductions.  The offset was appropriate, leg lengths   appeared to be equal best matched with the +8.5 head ball trial confirmed radiographically.   Given these findings, I went ahead and dislocated the hip, repositioned all   retractors and positioned the right hip in the extended and abducted position.  The final 12 Hi Actis stem was   chosen and it was impacted down to the level of neck cut.  Based on this   and the trial reductions, a final 36+8.5 Articuleze metal head ball was chosen and   impacted onto a clean and dry trunnion, and the hip was reduced.  The   hip had been irrigated throughout the case again at this point.  I did   reapproximate the superior capsular leaflet to the anterior leaflet   using #1 Vicryl.   The fascia of the   tensor fascia lata muscle was then reapproximated using #1 Vicryl and #0 Stratafix sutures.  The   remaining wound was closed with 2-0 Vicryl and running 4-0 Monocryl.  The hip was cleaned, dried, and dressed sterilely using Dermabond and   Aquacel dressing.  The patient was then brought   to recovery room in stable condition tolerating the procedure well.    Rosalene Billings, PA-C was present for the entirety of the case involved from   preoperative positioning, perioperative retractor management, general   facilitation of the case, as well as primary wound closure as assistant.            Madlyn Frankel Charlann Boxer, M.D.        02/20/2023 9:24 AM

## 2023-02-20 NOTE — H&P (View-Only) (Signed)
Patient ID: Steven Bradley, male   DOB: October 21, 1945, 77 y.o.   MRN: [id number]  Patient has been admitted for a left hip fracture Initial consult performed by Toni Arthurs  He has a left hip femoral neck fracture and right proximal humerus fracture  Plan is to go to OR today for left total hip replacement to manage his left hip fracture NPO Consent on chart  His right shoulder will be addressed by Dr Duwayne Heck this week

## 2023-02-20 NOTE — Progress Notes (Signed)
Patient ID: RYLEIGH BEISEL, male   DOB: October 21, 1945, 77 y.o.   MRN: [id number]  Patient has been admitted for a left hip fracture Initial consult performed by Toni Arthurs  He has a left hip femoral neck fracture and right proximal humerus fracture  Plan is to go to OR today for left total hip replacement to manage his left hip fracture NPO Consent on chart  His right shoulder will be addressed by Dr Duwayne Heck this week

## 2023-02-20 NOTE — Discharge Instructions (Signed)

## 2023-02-21 ENCOUNTER — Inpatient Hospital Stay (HOSPITAL_COMMUNITY): Payer: No Typology Code available for payment source | Admitting: Certified Registered Nurse Anesthetist

## 2023-02-21 ENCOUNTER — Encounter (HOSPITAL_COMMUNITY)
Admission: EM | Disposition: A | Discharge status: Skilled Nursing Facility | Payer: Self-pay | Source: Home / Self Care | Attending: Family Medicine

## 2023-02-21 ENCOUNTER — Inpatient Hospital Stay (HOSPITAL_COMMUNITY): Payer: No Typology Code available for payment source

## 2023-02-21 ENCOUNTER — Encounter (HOSPITAL_COMMUNITY): Payer: Self-pay | Admitting: Orthopedic Surgery

## 2023-02-21 DIAGNOSIS — S42251A Displaced fracture of greater tuberosity of right humerus, initial encounter for closed fracture: Secondary | ICD-10-CM | POA: Diagnosis not present

## 2023-02-21 DIAGNOSIS — R569 Unspecified convulsions: Secondary | ICD-10-CM | POA: Diagnosis not present

## 2023-02-21 HISTORY — PX: REVERSE SHOULDER ARTHROPLASTY: SHX5054

## 2023-02-21 LAB — COMPREHENSIVE METABOLIC PANEL
ALT: 68 U/L — ABNORMAL HIGH (ref 0–44)
AST: 171 U/L — ABNORMAL HIGH (ref 15–41)
Albumin: 2.4 g/dL — ABNORMAL LOW (ref 3.5–5.0)
Alkaline Phosphatase: 159 U/L — ABNORMAL HIGH (ref 38–126)
Anion gap: 8 (ref 5–15)
BUN: 20 mg/dL (ref 8–23)
CO2: 20 mmol/L — ABNORMAL LOW (ref 22–32)
Calcium: 7.9 mg/dL — ABNORMAL LOW (ref 8.9–10.3)
Chloride: 98 mmol/L (ref 98–111)
Creatinine, Ser: 0.94 mg/dL (ref 0.61–1.24)
GFR, Estimated: >60 mL/min (ref >60)
Glucose, Bld: 129 mg/dL — ABNORMAL HIGH (ref 70–99)
Potassium: 4.6 mmol/L (ref 3.5–5.1)
Sodium: 126 mmol/L — ABNORMAL LOW (ref 135–145)
Total Bilirubin: 2.1 mg/dL — ABNORMAL HIGH (ref <1.2)
Total Protein: 6.1 g/dL — ABNORMAL LOW (ref 6.5–8.1)

## 2023-02-21 LAB — CBC WITH DIFFERENTIAL/PLATELET
Abs Immature Granulocytes: 0.12 10*3/uL — ABNORMAL HIGH (ref 0.00–0.07)
Basophils Absolute: 0 10*3/uL (ref 0.0–0.1)
Basophils Relative: 0 %
Eosinophils Absolute: 0 10*3/uL (ref 0.0–0.5)
Eosinophils Relative: 0 %
HCT: 21 % — ABNORMAL LOW (ref 39.0–52.0)
Hemoglobin: 6.7 g/dL — CL (ref 13.0–17.0)
Immature Granulocytes: 1 %
Lymphocytes Relative: 6 %
Lymphs Abs: 0.7 10*3/uL (ref 0.7–4.0)
MCH: 34 pg (ref 26.0–34.0)
MCHC: 31.9 g/dL (ref 30.0–36.0)
MCV: 106.6 fL — ABNORMAL HIGH (ref 80.0–100.0)
Monocytes Absolute: 2 10*3/uL — ABNORMAL HIGH (ref 0.1–1.0)
Monocytes Relative: 17 %
Neutro Abs: 9.1 10*3/uL — ABNORMAL HIGH (ref 1.7–7.7)
Neutrophils Relative %: 76 %
Platelets: 109 10*3/uL — ABNORMAL LOW (ref 150–400)
RBC: 1.97 MIL/uL — ABNORMAL LOW (ref 4.22–5.81)
RDW: 13 % (ref 11.5–15.5)
WBC: 12 10*3/uL — ABNORMAL HIGH (ref 4.0–10.5)
nRBC: 0 % (ref 0.0–0.2)

## 2023-02-21 LAB — GLUCOSE, CAPILLARY: Glucose-Capillary: 140 mg/dL — ABNORMAL HIGH (ref 70–99)

## 2023-02-21 LAB — PREPARE RBC (CROSSMATCH)

## 2023-02-21 LAB — MAGNESIUM: Magnesium: 1.6 mg/dL — ABNORMAL LOW (ref 1.7–2.4)

## 2023-02-21 SURGERY — ARTHROPLASTY, SHOULDER, TOTAL, REVERSE
Anesthesia: General | Site: Shoulder | Laterality: Right

## 2023-02-21 MED ORDER — FENTANYL CITRATE PF 50 MCG/ML IJ SOSY
25.0000 ug | PREFILLED_SYRINGE | INTRAMUSCULAR | Status: DC | PRN
  Administered 2023-02-21: 50 ug via INTRAVENOUS

## 2023-02-21 MED ORDER — ACETAMINOPHEN 10 MG/ML IV SOLN: 1000.0000 mg | Freq: Once | INTRAVENOUS | Status: DC | PRN

## 2023-02-21 MED ORDER — STERILE WATER FOR IRRIGATION IR SOLN
Status: DC | PRN
  Administered 2023-02-21: 1000 mL

## 2023-02-21 MED ORDER — VANCOMYCIN HCL 1000 MG IV SOLR
INTRAVENOUS | Status: AC
  Filled 2023-02-21: qty 20

## 2023-02-21 MED ORDER — OXYCODONE HCL 5 MG PO TABS
5.0000 mg | ORAL_TABLET | Freq: Once | ORAL | Status: DC | PRN
Start: 2023-02-21 — End: 2023-02-21

## 2023-02-21 MED ORDER — TRANEXAMIC ACID-NACL 1000-0.7 MG/100ML-% IV SOLN
1000.0000 mg | Freq: Once | INTRAVENOUS | Status: AC
  Administered 2023-02-21: 1000 mg via INTRAVENOUS
  Filled 2023-02-21: qty 100

## 2023-02-21 MED ORDER — LIDOCAINE HCL (CARDIAC) PF 100 MG/5ML IV SOSY
PREFILLED_SYRINGE | INTRAVENOUS | Status: DC | PRN
  Administered 2023-02-21: 50 mg via INTRAVENOUS

## 2023-02-21 MED ORDER — PROPOFOL 10 MG/ML IV BOLUS
INTRAVENOUS | Status: AC
  Filled 2023-02-21: qty 20

## 2023-02-21 MED ORDER — CEFAZOLIN SODIUM-DEXTROSE 2-4 GM/100ML-% IV SOLN
2.0000 g | Freq: Four times a day (QID) | INTRAVENOUS | Status: AC
  Administered 2023-02-21 – 2023-02-22 (×2): 2 g via INTRAVENOUS
  Filled 2023-02-21 (×2): qty 100

## 2023-02-21 MED ORDER — OXYCODONE HCL 5 MG/5ML PO SOLN: 5.0000 mg | Freq: Once | ORAL | Status: DC | PRN

## 2023-02-21 MED ORDER — 0.9 % SODIUM CHLORIDE (POUR BTL) OPTIME
TOPICAL | Status: DC | PRN
  Administered 2023-02-21: 1000 mL

## 2023-02-21 MED ORDER — FENTANYL CITRATE (PF) 100 MCG/2ML IJ SOLN
INTRAMUSCULAR | Status: DC | PRN
  Administered 2023-02-21 (×2): 50 ug via INTRAVENOUS

## 2023-02-21 MED ORDER — SODIUM CHLORIDE 0.9% IV SOLUTION
Freq: Once | INTRAVENOUS | Status: AC
Start: 2023-02-21 — End: 2023-02-21

## 2023-02-21 MED ORDER — MAGNESIUM SULFATE 2 GM/50ML IV SOLN
2.0000 g | Freq: Once | INTRAVENOUS | Status: AC
  Administered 2023-02-21: 2 g via INTRAVENOUS
  Filled 2023-02-21: qty 50

## 2023-02-21 MED ORDER — CHLORHEXIDINE GLUCONATE 0.12 % MT SOLN
15.0000 mL | Freq: Once | OROMUCOSAL | Status: AC
  Administered 2023-02-21: 15 mL via OROMUCOSAL

## 2023-02-21 MED ORDER — AMISULPRIDE (ANTIEMETIC) 5 MG/2ML IV SOLN: 10.0000 mg | Freq: Once | INTRAVENOUS | Status: DC | PRN

## 2023-02-21 MED ORDER — SUGAMMADEX SODIUM 200 MG/2ML IV SOLN
INTRAVENOUS | Status: DC | PRN
Start: 2023-02-21 — End: 2023-02-21
  Administered 2023-02-21: 200 mg via INTRAVENOUS

## 2023-02-21 MED ORDER — FENTANYL CITRATE PF 50 MCG/ML IJ SOSY
PREFILLED_SYRINGE | INTRAMUSCULAR | Status: AC
  Filled 2023-02-21: qty 1

## 2023-02-21 MED ORDER — PHENYLEPHRINE HCL-NACL 20-0.9 MG/250ML-% IV SOLN
INTRAVENOUS | Status: DC | PRN
Start: 2023-02-21 — End: 2023-02-21
  Administered 2023-02-21: 50 ug/min via INTRAVENOUS
  Administered 2023-02-21: 80 ug via INTRAVENOUS

## 2023-02-21 MED ORDER — FENTANYL CITRATE (PF) 100 MCG/2ML IJ SOLN
INTRAMUSCULAR | Status: AC
  Filled 2023-02-21: qty 2

## 2023-02-21 MED ORDER — BUPIVACAINE LIPOSOME 1.3 % IJ SUSP
INTRAMUSCULAR | Status: DC | PRN
  Administered 2023-02-21: 10 mL via PERINEURAL

## 2023-02-21 MED ORDER — PROPOFOL 10 MG/ML IV BOLUS
INTRAVENOUS | Status: DC | PRN
  Administered 2023-02-21: 100 mg via INTRAVENOUS
  Administered 2023-02-21: 50 mg via INTRAVENOUS

## 2023-02-21 MED ORDER — ORAL CARE MOUTH RINSE: 15.0000 mL | Freq: Once | OROMUCOSAL | Status: AC

## 2023-02-21 MED ORDER — VANCOMYCIN HCL 1000 MG IV SOLR
INTRAVENOUS | Status: DC | PRN
  Administered 2023-02-21: 1000 mg via TOPICAL

## 2023-02-21 MED ORDER — LACTATED RINGERS IV SOLN
INTRAVENOUS | Status: DC
Start: 2023-02-21 — End: 2023-02-21

## 2023-02-21 MED ORDER — BUPIVACAINE HCL (PF) 0.5 % IJ SOLN
INTRAMUSCULAR | Status: DC | PRN
  Administered 2023-02-21: 10 mL via PERINEURAL

## 2023-02-21 MED ORDER — FENTANYL CITRATE PF 50 MCG/ML IJ SOSY
50.0000 ug | PREFILLED_SYRINGE | INTRAMUSCULAR | Status: DC
  Administered 2023-02-21: 50 ug via INTRAVENOUS
  Filled 2023-02-21: qty 1

## 2023-02-21 MED ORDER — ROCURONIUM BROMIDE 100 MG/10ML IV SOLN
INTRAVENOUS | Status: DC | PRN
  Administered 2023-02-21: 30 mg via INTRAVENOUS
  Administered 2023-02-21: 50 mg via INTRAVENOUS

## 2023-02-21 SURGICAL SUPPLY — 62 items
BAG COUNTER SPONGE SURGICOUNT (BAG) IMPLANT
BAG ZIPLOCK 12X15 (MISCELLANEOUS) ×1 IMPLANT
BASEPLATE GLENOSPHERE 25 (Plate) IMPLANT
BEARING HUMERAL SHLDER 36M STD (Shoulder) IMPLANT
BIT DRILL TWIST 2.7 (BIT) IMPLANT
BLADE SAG 18X100X1.27 (BLADE) ×1 IMPLANT
CLSR STERI-STRIP ANTIMIC 1/2X4 (GAUZE/BANDAGES/DRESSINGS) IMPLANT
COOLER ICEMAN CLASSIC (MISCELLANEOUS) ×1 IMPLANT
COVER BACK TABLE 60X90IN (DRAPES) ×1 IMPLANT
COVER SURGICAL LIGHT HANDLE (MISCELLANEOUS) ×1 IMPLANT
DRAPE SHEET LG 3/4 BI-LAMINATE (DRAPES) ×1 IMPLANT
DRAPE SURG 17X11 SM STRL (DRAPES) ×1 IMPLANT
DRAPE SURG ORHT 6 SPLT 77X108 (DRAPES) ×2 IMPLANT
DRAPE TOP 10253 STERILE (DRAPES) ×1 IMPLANT
DRAPE U-SHAPE 47X51 STRL (DRAPES) ×1 IMPLANT
DRSG AQUACEL AG ADV 3.5X 6 (GAUZE/BANDAGES/DRESSINGS) IMPLANT
DRSG AQUACEL AG ADV 3.5X10 (GAUZE/BANDAGES/DRESSINGS) ×1 IMPLANT
DURAPREP 26ML APPLICATOR (WOUND CARE) IMPLANT
ELECT REM PT RETURN 15FT ADLT (MISCELLANEOUS) ×1 IMPLANT
FACESHIELD WRAPAROUND (MASK) ×1 IMPLANT
FACESHIELD WRAPAROUND OR TEAM (MASK) ×1 IMPLANT
GLENOID SPHERE STD STRL 36MM (Orthopedic Implant) IMPLANT
GLOVE BIO SURGEON STRL SZ7.5 (GLOVE) ×4 IMPLANT
GLOVE BIOGEL PI IND STRL 8 (GLOVE) ×2 IMPLANT
GOWN STRL REUS W/ TWL XL LVL3 (GOWN DISPOSABLE) ×2 IMPLANT
KIT BASIN OR (CUSTOM PROCEDURE TRAY) ×1 IMPLANT
KIT TURNOVER KIT A (KITS) IMPLANT
MANIFOLD NEPTUNE II (INSTRUMENTS) ×1 IMPLANT
NDL TAPERED W/ NITINOL LOOP (MISCELLANEOUS) IMPLANT
NEEDLE TAPERED W/ NITINOL LOOP (MISCELLANEOUS) IMPLANT
NS IRRIG 1000ML POUR BTL (IV SOLUTION) ×1 IMPLANT
PACK SHOULDER (CUSTOM PROCEDURE TRAY) ×1 IMPLANT
PAD CAST 4YDX4 CTTN HI CHSV (CAST SUPPLIES) ×1 IMPLANT
PAD COLD SHLDR WRAP-ON (PAD) ×1 IMPLANT
PIN THREADED REVERSE (PIN) IMPLANT
RESTRAINT HEAD UNIVERSAL NS (MISCELLANEOUS) IMPLANT
SCREW BONE CORT 6.5X35MM (Screw) IMPLANT
SCREW BONE LOCKING 4.75X35X3.5 (Screw) IMPLANT
SCREW LOCKING 4.75MMX15MM (Screw) IMPLANT
SCREW LOCKING NS 4.75MMX20MM (Screw) IMPLANT
SHOULDER HUMERAL BEAR 36M STD (Shoulder) ×1 IMPLANT
SLING ARM FOAM STRAP MED (SOFTGOODS) IMPLANT
SLING ARM IMMOBILIZER XL (CAST SUPPLIES) IMPLANT
SMARTMIX MINI TOWER (MISCELLANEOUS)
SPONGE T-LAP 4X18 ~~LOC~~+RFID (SPONGE) IMPLANT
STEM HUMERAL STRL 17MMX83MM (Stem) IMPLANT
STRIP CLOSURE SKIN 1/2X4 (GAUZE/BANDAGES/DRESSINGS) ×1 IMPLANT
SUCTION TUBE FRAZIER 10FR DISP (SUCTIONS) ×1 IMPLANT
SUT FIBERWIRE #2 38 T-5 BLUE (SUTURE)
SUT MAXBRAID #2 CVD NDL (SUTURE) IMPLANT
SUT MAXBRAID #5 CCS-NDL 2PK (SUTURE) IMPLANT
SUT MNCRL AB 3-0 PS2 18 (SUTURE) ×1 IMPLANT
SUT VIC AB 0 CT1 36 (SUTURE) ×1 IMPLANT
SUT VIC AB 1 CT1 36 (SUTURE) ×1 IMPLANT
SUT VIC AB 2-0 CT1 TAPERPNT 27 (SUTURE) ×1 IMPLANT
SUTURE FIBERWR #2 38 T-5 BLUE (SUTURE) IMPLANT
SUTURE TAPE 1.3 40 TPR END (SUTURE) ×1 IMPLANT
SUTURETAPE 1.3 40 TPR END (SUTURE) ×1
TOWEL OR 17X26 10 PK STRL BLUE (TOWEL DISPOSABLE) ×1 IMPLANT
TOWER SMARTMIX MINI (MISCELLANEOUS) IMPLANT
TRAY HUM MINI SHOULDER +3 40 (Joint) IMPLANT
TUBE SUCTION HIGH CAP CLEAR NV (SUCTIONS) ×1 IMPLANT

## 2023-02-21 NOTE — Progress Notes (Signed)
Patient ID: Steven Bradley, male   DOB: 07/31/1945, 77 y.o.   MRN: [id number] Subjective: 1 Day Post-Op Procedure(s) (LRB): TOTAL HIP ARTHROPLASTY ANTERIOR APPROACH (Left)    Patient reports pain as mild with regards to his left hip.  Still with right shoulder pain - planned procedure today  Objective:   VITALS:   Vitals:   02/21/23 0039 02/21/23 0538  BP: 129/84 102/70  Pulse: 79 65  Resp: 16 16  Temp: 98.7 F (37.1 C) 98 F (36.7 C)  SpO2: 100% 100%    Neurovascular intact Incision: dressing C/D/I - left hip dressing dry, expected post-op thigh swelling  LABS Recent Labs    02/19/23 0516 02/20/23 0531 02/21/23 0502  HGB 9.3* 8.9* 6.7*  HCT 27.5* 26.1* 21.0*  WBC 11.4* 7.9 12.0*  PLT 79* 82* 109*    Recent Labs    02/19/23 0516 02/20/23 0531 02/21/23 0502  NA 127* 128* 126*  K 3.3* 3.5 4.6  BUN 23 15 20   CREATININE 1.12 0.82 0.94  GLUCOSE 113* 110* 129*    No results for input(s): "LABPT", "INR" in the last 72 hours.   Assessment/Plan: 1 Day Post-Op Procedure(s) (LRB): TOTAL HIP ARTHROPLASTY ANTERIOR APPROACH (Left)   Advance diet Up with therapy  Acute on chronic blood loss anemia.  He has likely lost blood from his 2 fractures as well as from his left total hip replacement Agree with 2 units PRBCs Iron supplementation if tolerable (BID)  Therapy: WBAT LLE Will likely be non weight bearing through his right shoulder and thus may need left sided hemi-walker  Wife interested in taking him home provided he is safe with therapy. Their home is well equipped to manage

## 2023-02-21 NOTE — Progress Notes (Signed)
[facility] PROGRESS NOTE  Steven Bradley (DOB: [birth year]) LKG:[id number] PCP: [facility]  Brief Narrative: Steven Bradley is a 77 y.o. male with a history of seizure disorder, HTN, HLD, prostate CA, EtOH abuse and alcohol-associated hepatic ascites who presented to the ED on 02/17/2023 after a fall. His spouse reported hearing a loud noise at the bathroom in their RV, where she found him on the floor, this happened around 9:30 AM, patient reports he struck his face on the toilet, had bleeding and swelling of his lips, there was no loss of consciousness noted, he refused transport by EMS, wife assisted him to the couch where an hour later where he was witnessed to have seizures, lasted for 7 to 8 minutes, it was witnessed by brother-in-law, will describe generalized jerking and shaking, reports patient did not have seizures for last 9-month, and he has been compliant with his meds Keppra,.  Patient with left hip pain, wife thought secondary to gout (x-ray was significant for femur fracture), as well he does endorse right shoulder pain as well which they think it is due to gout as well( shoulder x-ray still pending). -In ED patient was noted to be with significant lip bruise, tongue bleed, he was noted to be in new onset A-fib with RVR, requiring Cardizem drip, he had severe electrolyte derangement loading sodium of 133, potassium of 2.5, magnesium of 0.8, phosphorus of 2.2, anion gap of 19, AST of 247, ALT of 74, total bili of 3.5, his MCV was 100.5, and platelet count was 99K, his x-ray significant for left hip fracture as well, [facility] hospitalist consulted to admit.  He was transported to [facility] for orthopedic surgery. Hospitalization complicated by new onset AFib.   Subjective: No issues, denies overt bleeding anywhere, no chest pain or dyspnea. He's just hungry.   Objective: BP 110/69 (BP Location: Left Arm)   Pulse 71   Temp 98.2 F (36.8 C) (Oral)   Resp 18   Ht 6\' 6"  (1.981  m)   Wt 95 kg   SpO2 100%   BMI 24.20 kg/m   No distress Conjunctival pallor. Stable lower lip ecchymosis, edema, settling ecchymosis Clear, nonlabored RRR, no MRG or pitting edema Left lateral thigh wound c/d/I without ecchymosis.  Alert, oriented, no focal deficits, though ROM limited in LLE and RUE. Cap refill brisk throughout.   Assessment & Plan: Principal Problem:   Seizure (HCC) Active Problems:   Hypokalemia   Hyponatremia   Thrombocytopenia (HCC)   Essential hypertension   Elevated LFTs   Hyperlipemia   GERD (gastroesophageal reflux disease)   Macrocytosis   Gout of multiple sites   S/P total left hip arthroplasty  Breakthrough seizure:  - Loaded with, and increased keppra dose per neurology, Dr. Selina Bradley, no EEG required at this time. Continue 1,250mg  BID - Seizure precautions - Will follow up with Dr. Karel Bradley at [facility].   Right humerus fracture:  - To OR today per orthopedic surgery, remains NPO 12/9.   Left femur fracture: s/p anterior-approach THA 12/8 with Dr. Charlann Bradley - Postoperative pain control, weight bearing, wound care, and VTE ppx per orthopedics.  - Will obtain PT/OT evaluations, anticipate rehab requirement.  ABLA and thrombocytopenia:  - 1u RBCs ordered this morning. Given his need for additional orthopedic surgery, will order an additional unit and recheck postoperatively.   New, paroxysmal atrial fibrillation with RVR: Resolved, back in NSR. Echo showed LVEF 60-65%, mild LVH, G1DD, no WMA. low-normal RVSF. Normal biatrial size. - Continue  oral diltiazem, consolidated - Holding anticoagulation with severity of fall, EtOH use, and anemia/thrombocytopenia.  EtOH abuse: Recent history per spouse is less drinking overall, not daily.  - Continue low dose librium taper. CIWA scores remain reassuring. - MVM, folate, thiamine per protocol  Chronic gout: No exacerbation noted at this time.  - Continue home allopurinol.   HTN, HLD: Continue meds as ordered.     Hypokalemia - Repeat supplement   Hypomagnesemia: Improved, monitoring.   Hypophosphatemia: Resolved.   Depression: Continue medications as per pt's spouse.    Hyponatremia: Continue NS until taking adequate po. Liver disease contributing. - If persistent postoperatively, would get further work up.   Transaminitis - LFT elevation consistent with chronic alcohol abuse   GERD: PPI   Macrocytosis - B12 low normal at 208, will supplement - folate 10.8   Steven Nine, MD [facility] [email or web address] 02/21/2023, 11:57 AM

## 2023-02-21 NOTE — Brief Op Note (Signed)
02/17/2023 - 02/21/2023  6:27 PM  PATIENT:  Steven Bradley  77 y.o. male  PRE-OPERATIVE DIAGNOSIS:  RIGHT SHOULDER FRACTURE  POST-OPERATIVE DIAGNOSIS:  RIGHT SHOULDER FRACTURE  PROCEDURE:  Procedure(s): REVERSE SHOULDER ARTHROPLASTY (Right) for fracture Right shoulder open reduction internal fixation of greater tuberosity and lesser tuberosity  SURGEON:  Surgeons and Role:    * Yolonda Kida, MD - Primary  PHYSICIAN ASSISTANT:  Dion Saucier, PA-C   ANESTHESIA:   regional and general  EBL:  150 mL   BLOOD ADMINISTERED:none  DRAINS: none   LOCAL MEDICATIONS USED:  NONE  SPECIMEN:  No Specimen  DISPOSITION OF SPECIMEN:  N/A  COUNTS:  YES  TOURNIQUET:  * No tourniquets in log *  DICTATION: .Note written in EPIC  PLAN OF CARE: Admit to inpatient   PATIENT DISPOSITION:  PACU - hemodynamically stable.   Delay start of Pharmacological VTE agent (>24hrs) due to surgical blood loss or risk of bleeding: not applicable

## 2023-02-21 NOTE — Anesthesia Procedure Notes (Signed)
Procedure Name: Intubation Date/Time: 02/21/2023 4:36 PM  Performed by: Carloyn Manner, CRNAPre-anesthesia Checklist: Patient identified, Emergency Drugs available, Suction available, Patient being monitored and Timeout performed Patient Re-evaluated:Patient Re-evaluated prior to induction Oxygen Delivery Method: Circle system utilized Preoxygenation: Pre-oxygenation with 100% oxygen Induction Type: IV induction Ventilation: Mask ventilation without difficulty Laryngoscope Size: Glidescope and 4 Grade View: Grade II Tube type: Oral Tube size: 7.5 mm Number of attempts: 1 Airway Equipment and Method: Video-laryngoscopy Placement Confirmation: ETT inserted through vocal cords under direct vision Secured at: 25 cm Tube secured with: Tape Dental Injury: Teeth and Oropharynx as per pre-operative assessment

## 2023-02-21 NOTE — [place] FL2 (Signed)
[place] MEDICAID FL2 LEVEL OF CARE FORM     IDENTIFICATION  Patient Name: Steven Bradley Birthdate: 03-May-1945 Sex: male Admission Date (Current Location): 02/17/2023  [facility] and [place] Number:  Producer, television/film/video and Address:  [facility],  [address] 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   Seizure (HCC) 02/17/2023   Sepsis secondary to UTI (HCC) 10/17/2022   Lactic acidosis    Hypotension 05/15/2019   Tachycardia 05/15/2019   History of bladder surgery 05/15/2019   Leukopenia 05/15/2019   Hypovolemia 05/15/2019   Gross hematuria 05/15/2019   Gout of multiple sites 12/22/2018   Macrocytosis 12/21/2018   COVID-19 virus infection 12/21/2018   Severe sepsis (HCC) 06/04/2016   Bacteremia due to Gram-positive bacteria 06/04/2016   Acute pyelonephritis 06/02/2016   Acute lower UTI 06/01/2016   Hyperlipemia 06/01/2016   GERD (gastroesophageal reflux disease) 06/01/2016   Nausea and vomiting 06/01/2016   Nephrotic syndrome 12/29/2013   Purpura (HCC) 12/29/2013   Hypokalemia 12/29/2013   Hyponatremia 12/29/2013   Thrombocytopenia (HCC) 12/29/2013   Essential hypertension 12/29/2013   Elevated LFTs 12/29/2013    Orientation RESPIRATION BLADDER Height & Weight     Self, Situation, Time, Place  O2 Incontinent, External catheter Weight: 209 lb 7 oz (95 kg) Height:  6\' 6"  (198.1 cm)  BEHAVIORAL SYMPTOMS/MOOD NEUROLOGICAL BOWEL NUTRITION STATUS      Incontinent Diet (See DC summary)  AMBULATORY STATUS  COMMUNICATION OF NEEDS Skin   Limited Assist Verbally Skin abrasions (Laceration Lip Lower Busted Lip d/t Fall)                       Personal Care Assistance Level of Assistance  Bathing, Feeding, Dressing Bathing Assistance: Limited assistance Feeding assistance: Limited assistance Dressing Assistance: Limited assistance     Functional Limitations Info  Sight, Hearing, Speech Sight Info: Adequate Hearing Info: Adequate Speech Info: Adequate    SPECIAL CARE FACTORS FREQUENCY  PT (By licensed PT), OT (By licensed OT)     PT Frequency: 5x/wk OT Frequency: 5x/wk            Contractures Contractures Info: Not present    Additional Factors Info  Code Status, Allergies Code Status Info: DNR Allergies Info: Viagra (Sildenafil), Ciprofloxacin, Spironolactone, Vitamin D Analogs           Current Medications (02/21/2023):  This is the current hospital active medication list Current Facility-Administered Medications  Medication Dose Route Frequency Provider Last Rate Last Admin   acetaminophen (TYLENOL) tablet 1,000 mg  1,000 mg Oral Q6H Cassandria Anger, PA-C   1,000 mg at 02/21/23 0520   allopurinol (ZYLOPRIM) tablet 100 mg  100 mg Oral Daily Cassandria Anger, PA-C   100 mg at 02/20/23 1242   alum & mag hydroxide-simeth (MAALOX/MYLANTA) 200-200-20 MG/5ML suspension 30 mL  30 mL Oral Q4H PRN Cassandria Anger, PA-C       aspirin chewable tablet 81 mg  81 mg Oral BID Cassandria Anger, PA-C  81 mg at 02/20/23 2126   bisacodyl (DULCOLAX) suppository 10 mg  10 mg Rectal Daily PRN Cassandria Anger, PA-C       ceFAZolin (ANCEF) IVPB 2g/100 mL premix  2 g Intravenous On Call to OR Yolonda Kida, MD       chlordiazePOXIDE (LIBRIUM) capsule 10 mg  10 mg Oral TID Cassandria Anger, PA-C   10 mg at 02/20/23 2123   Followed by   Melene Muller ON 02/22/2023] chlordiazePOXIDE (LIBRIUM) capsule 5 mg  5 mg Oral TID Cassandria Anger, PA-C       chlorhexidine (HIBICLENS) 4 % liquid  4 Application  60 mL Topical Once Yolonda Kida, MD       Chlorhexidine Gluconate Cloth 2 % PADS 6 each  6 each Topical Q0600 Cassandria Anger, PA-C   6 each at 02/21/23 0500   cyanocobalamin (VITAMIN B12) tablet 1,000 mcg  1,000 mcg Oral Daily Cassandria Anger, PA-C   1,000 mcg at 02/20/23 1243   diltiazem (CARDIZEM CD) 24 hr capsule 120 mg  120 mg Oral Daily Cassandria Anger, PA-C   120 mg at 02/20/23 1243   diphenhydrAMINE (BENADRYL) 12.5 MG/5ML elixir 12.5-25 mg  12.5-25 mg Oral Q4H PRN Cassandria Anger, PA-C       feeding supplement (BOOST / RESOURCE BREEZE) liquid 1 Container  1 Container Oral TID BM Cassandria Anger, PA-C   1 Container at 02/19/23 1912   folic acid (FOLVITE) tablet 1 mg  1 mg Oral Daily Tyrone Nine, MD       HYDROmorphone (DILAUDID) injection 0.5-1 mg  0.5-1 mg Intravenous Q4H PRN Cassandria Anger, PA-C   0.5 mg at 02/21/23 1112   levETIRAcetam (KEPPRA) tablet 1,250 mg  1,250 mg Oral BID Cassandria Anger, PA-C   1,250 mg at 02/20/23 2121   lip balm (CARMEX) ointment   Topical PRN Cassandria Anger, PA-C   1 Application at 02/20/23 1610   LORazepam (ATIVAN) injection 1-2 mg  1-2 mg Intravenous Q1H PRN Cassandria Anger, PA-C   1 mg at 02/18/23 2043   menthol-cetylpyridinium (CEPACOL) lozenge 3 mg  1 lozenge Oral PRN Cassandria Anger, PA-C       Or   phenol (CHLORASEPTIC) mouth spray 1 spray  1 spray Mouth/Throat PRN Cassandria Anger, PA-C       methocarbamol (ROBAXIN) tablet 500 mg  500 mg Oral Q6H PRN Cassandria Anger, PA-C       Or   methocarbamol (ROBAXIN) injection 500 mg  500 mg Intravenous Q6H PRN Cassandria Anger, PA-C       metoCLOPramide (REGLAN) tablet 5-10 mg  5-10 mg Oral Q8H PRN Cassandria Anger, PA-C       Or   metoCLOPramide (REGLAN) injection 5-10 mg  5-10 mg Intravenous Q8H PRN Cassandria Anger, PA-C       multivitamin with minerals tablet 1 tablet  1 tablet Oral Daily Cassandria Anger, PA-C   1 tablet at 02/20/23 1242   mupirocin  ointment (BACTROBAN) 2 % 1 Application  1 Application Nasal BID Cassandria Anger, PA-C   1 Application at 02/21/23 1002   ondansetron (ZOFRAN) tablet 4 mg  4 mg Oral Q6H PRN Cassandria Anger, PA-C       Or   ondansetron [facility]) injection 4 mg  4 mg Intravenous Q6H PRN Cassandria Anger, PA-C       oxyCODONE (Oxy IR/ROXICODONE) immediate release tablet  10 mg  10 mg Oral Q4H PRN Cassandria Anger, PA-C       oxyCODONE (Oxy IR/ROXICODONE) immediate release tablet 2.5-5 mg  2.5-5 mg Oral Q4H PRN Cassandria Anger, PA-C   5 mg at 02/20/23 2138   polyethylene glycol (MIRALAX / GLYCOLAX) packet 17 g  17 g Oral BID Cassandria Anger, PA-C   17 g at 02/20/23 2122   povidone-iodine 10 % swab 2 Application  2 Application Topical Once Yolonda Kida, MD       senna [facility]) tablet 17.2 mg  2 tablet Oral QHS Cassandria Anger, PA-C   17.2 mg at 02/20/23 2120   sodium chloride flush (NS) 0.9 % injection 10 mL  10 mL Intravenous Q12H Cassandria Anger, PA-C   10 mL at 02/21/23 1034   thiamine (VITAMIN B1) tablet 100 mg  100 mg Oral Daily Cassandria Anger, PA-C   100 mg at 02/19/23 1727   tranexamic acid (CYKLOKAPRON) IVPB 1,000 mg  1,000 mg Intravenous To OR Yolonda Kida, MD         Discharge Medications: Please see discharge summary for a list of discharge medications.  Relevant Imaging Results:  Relevant Lab Results:   Additional Information SSN: [id number]  Otelia Santee, LCSW

## 2023-02-21 NOTE — TOC Initial Note (Addendum)
Transition of Care [facility]) - Initial/Assessment Note    Patient Details  Name: Steven Bradley MRN: [id number] Date of Birth: Jul 15, 1945  Transition of Care [facility]) CM/SW Contact:    Otelia Santee, LCSW Phone Number: 02/21/2023, 2:06 PM  Clinical Narrative:                 Met with pt and spouse at bedside and confirmed plan for SNF placement Pt spouse shares that they would like placement at [facility] or [facility] if unable to obtain SNF at either of these locations then they would want pt to return home.  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  Referrals have been sent out for placement and currently awaiting bed offers. VM left with Lynnea Ferrier at [facility] to review referral. [facility] CLC checklist to be sent in to [facility] for insurance approval once signed by MD.    Rica Mote: CLC checklist complete and faxed to [facility].   Expected Discharge Plan: Skilled Nursing Facility Barriers to Discharge: Continued Medical Work up, SNF Pending bed offer   Patient Goals and CMS Choice Patient states their goals for this hospitalization and ongoing recovery are:: To go to SNF CMS Medicare.gov Compare Post Acute Care list provided to:: Patient Choice offered to / list presented to : Patient [place] ownership interest in [facility].provided to:: Patient    Expected Discharge Plan and Services In-house Referral: Clinical Social Work Discharge Planning Services: NA Post Acute Care Choice: Skilled Nursing Facility Living arrangements for the past 2 months: Mobile Home (RV)                 DME Arranged: N/A DME Agency: NA                  Prior Living Arrangements/Services Living arrangements for the past 2 months: Mobile Home (RV) Lives with:: Self Patient language and need for interpreter reviewed:: Yes Do you feel safe going back to the place where you live?: Yes      Need for Family Participation in Patient  Care: No (Comment) Care giver support system in place?: Yes (comment) Current home services: DME Gilmer Mor, RW) Criminal Activity/Legal Involvement Pertinent to Current Situation/Hospitalization: No - Comment as needed  Activities of Daily Living   ADL Screening (condition at time of admission) Independently performs ADLs?: No Does the patient have a NEW difficulty with bathing/dressing/toileting/self-feeding that is expected to last >3 days?: Yes (Initiates electronic notice to provider for possible OT consult) Does the patient have a NEW difficulty with getting in/out of bed, walking, or climbing stairs that is expected to last >3 days?: Yes (Initiates electronic notice to provider for possible PT consult) Does the patient have a NEW difficulty with communication that is expected to last >3 days?: No Is the patient deaf or have difficulty hearing?: No Does the patient have difficulty seeing, even when wearing glasses/contacts?: No Does the patient have difficulty concentrating, remembering, or making decisions?: No  Permission Sought/Granted Permission sought to share information with : Facility Medical sales representative, Family Supports Permission granted to share information with : Yes, Verbal Permission Granted  Share Information with NAME: Kenner Agro  Permission granted to share info w AGENCY: SNF's  Permission granted to share info w Relationship: Spouse  Permission granted to share info w Contact Information: [phone]  Emotional Assessment Appearance:: Appears stated age Attitude/Demeanor/Rapport: Engaged Affect (typically observed): Accepting Orientation: : Oriented to Self, Oriented  to  Time, Oriented to Place, Oriented to Situation Alcohol / Substance Use: Alcohol Use Psych Involvement: No (comment)  Admission diagnosis:  Hypomagnesemia [E83.42] Seizure (HCC) [R56.9] Seizures (HCC) [R56.9] New onset a-fib [facility]) [I48.91] Patient Active Problem List   Diagnosis Date  Noted   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   Seizure (HCC) 02/17/2023   Sepsis secondary to UTI (HCC) 10/17/2022   Lactic acidosis    Hypotension 05/15/2019   Tachycardia 05/15/2019   History of bladder surgery 05/15/2019   Leukopenia 05/15/2019   Hypovolemia 05/15/2019   Gross hematuria 05/15/2019   Gout of multiple sites 12/22/2018   Macrocytosis 12/21/2018   COVID-19 virus infection 12/21/2018   Severe sepsis (HCC) 06/04/2016   Bacteremia due to Gram-positive bacteria 06/04/2016   Acute pyelonephritis 06/02/2016   Acute lower UTI 06/01/2016   Hyperlipemia 06/01/2016   GERD (gastroesophageal reflux disease) 06/01/2016   Nausea and vomiting 06/01/2016   Nephrotic syndrome 12/29/2013   Purpura (HCC) 12/29/2013   Hypokalemia 12/29/2013   Hyponatremia 12/29/2013   Thrombocytopenia (HCC) 12/29/2013   Essential hypertension 12/29/2013   Elevated LFTs 12/29/2013   PCP:  Clinic, [facility] Pharmacy:   [facility] - [place], [place] - [address] [place] [address] Phone: [phone] Fax: [phone]  [facility] - [place], [place] - [address] [place] [address] Phone: [phone] Fax: [phone]  [facility] [address] [place] [address] Phone: [phone] Fax: [phone]     Social Determinants of Health (SDOH) Social History: SDOH Screenings   Food Insecurity: No Food Insecurity (02/17/2023)  Housing: Patient Declined (02/17/2023)  Transportation Needs: No Transportation Needs (02/17/2023)  Utilities: Not At Risk (02/17/2023)  Financial Resource Strain: Low Risk  (05/05/2022)   Received from [facility], [facility]  Social Connections: Unknown (04/29/2022)   Received from [facility], [facility]  Stress: No Stress Concern Present (04/29/2022)   [facility]  Tobacco Use: Low Risk  (02/18/2023)   SDOH Interventions:     Readmission Risk Interventions    02/21/2023    2:03 PM 10/19/2022    2:45 PM  Readmission Risk Prevention Plan  Post Dischage Appt  Complete  Medication Screening  Complete  Transportation Screening Complete Complete  PCP or Specialist Appt within 5-7 Days Complete   Home Care Screening Complete   Medication Review (RN CM) Complete

## 2023-02-21 NOTE — Op Note (Signed)
Date: 02/21/2023   PRE-OPERATIVE DIAGNOSIS: Right three-part proximal humerus fracture   POST-OPERATIVE DIAGNOSIS:  Same   PROCEDURE:  1.  Right REVERSE SHOULDER ARTHROPLASTY 2.  Open reduction and internal fixation of greater tuberosity and lesser tuberosity right shoulder   SURGEON:  Yolonda Kida, MD   ASSISTANT: Dion Saucier, PA-C  Assistant attestation:  PA McClung present for the entire procedure.   ANESTHESIA:   General with a block   ESTIMATED BLOOD LOSS: 150 cc   PREOPERATIVE INDICATIONS: [name]  is a right-hand-dominant 77 year old gentleman who sustained a right three-part proximal humerus fracture following a fall.  Due to the comminution and displaced nature of the fracture and head splitting components we discussed operative management.  We discussed moving forward with reverse shoulder arthroplasty for his injury to allow earlier weight bearing on a walker and/or cane as needed and lower risk of AVN, non union or progression of shoulder arthritis following the fracture. Thus we elected to proceed with reverse shoulder arthroplasty for the plan.  The risks benefits and alternatives were discussed with the patient preoperatively including but not limited to the risks of infection, bleeding, nerve injury, cardiopulmonary complications, the need for revision surgery, dislocation, brachial plexus palsy, incomplete relief of pain, among others, and the patient was willing to proceed. The patient did provided informed consent.   OPERATIVE IMPLANTS: Biomet size 17 mini humeral stem with humeral head autograft stem press fitted with a +3 mm taper offset for lateralization humeral tray and a standard polyethylene liner. Size 36 mm standard glenosphere 25 mm mini baseplate with a 35 mm central screw and 4 peripheral locking screws.    OPERATIVE FINDINGS:   3 part proximal humerus fracture with surgical neck component as well as humeral head and greater tuberosity  moving as 1 piece and the lesser tuberosity fractured as a separate piece.  Rotator cuff intact.  There was a large and engaging anterior humeral head Hill-Sachs lesion from the posterior displacement of the humeral head part.   OPERATIVE PROCEDURE: The patient was brought to the operating room and placed in the supine position. General anesthesia was administered. IV antibiotics were given. Time out was performed. The upper extremity was prepped and draped in usual sterile fashion. The patient was in a beachchair position. Deltopectoral approach was carried out. After dissection through skin and subcutaneous fat, the cephalic vein was identified with the deltopectoral interval.  This was mobilized and taken lateral.   The fracture was identified and working through the fracture in the biceps groove the lesser and greater tuberosities were freed up and tagged with #2 Fiber wire sutures.  The humeral head was fragmented and removed from the wound.     Next, the long head of the biceps tendon was released.   I then performed circumferential releases of the humerus.  I then moved to sizing the humerus.  There was no calcar bone loss and this was used to reference the height of the stem, as was the upper border of the pectoralis major tendon.  The canal was reamed and found to fit best with a 17 mm many stem.  There was still some inability to get excellent purchase so we elected to use some autograft on the humeral head to fill the metaphysis for better press-fit of the stem.  This seemed to work quite well.   We next turned to the glenoid.  Deep retractors were placed, and I resected the labrum as well as the residual  long head of biceps, and then placed a guidepin into the center position on the glenoid, with slight inferior declination. I then reamed over the guidepin, and this created a small metaphyseal cancellus blush inferiorly, removing just the cartilage to the subchondral bone superiorly. The base  plate was selected and impacted place, and then I secured it centrally with a nonlocking screw, and I had excellent purchase both inferiorly and superiorly. I placed a short locking screws on anterior aspect,  And posterior aspect.   I then turned my attention to the glenosphere, and impacted this into place, placing slight inferior offset.    The glenoid sphere was completely seated, and had engagement of the [facility] taper. I then turned my attention back to the humerus.    The 17 mm stem was seated to the appropriate height to allow approximate 5.6 cm from the top of the Pectoralis major tendon to the top of the glenosphere.  The stem was placed in 30 degrees of retroversion.   Once the stem was impacted into place we trialed poly liners.  Using the standard humeral metaglen,  The shoulder had excellent motion, and was stable.  The final poly was impacted and again showed good motion and stability.  Next, I irrigated the wounds copiously.   We then moved ahead with the internal fixation of the tuberosity fragments to the proximal stem.  We had placed sutures around the proximal stem prior to impacting into place so that we could use these for our stem based reconstruction.  We also would utilize the previously placed tagging sutures in the lesser tuberosity and greater tuberosity fragments.   The greater tuberosity was brought back to the humeral stem suture holes and secured with bone graft from the humeral head as augment.  The lesser tuberosity was likewise repaired back to the anterior humeral stem with the sutures around the stem as well as going side-to-side with the sutures that were utilized to repair and tagged the greater tuberosity.  The axillary nerve was palpated at the end of implanting, and found to be in continuity and not under undo tension.   I then irrigated the shoulder copiously once more, we did place a gram of vancomycin powder into the deltopectoral interval, repaired the  deltopectoral interval with Vicryl followed by subcutaneous monocryl and then subcuticular monocryl with Steri-Strips and sterile gauze for the skin. The patient was awakened and returned back in stable and satisfactory condition. There no complications and he tolerated the procedure well.  All counts were correct.  The patient awakened from general anesthesia with no complications and transferred to PACU in stable condition.   Postoperative Plan: JIRAIYA FORSHEE will remain in his sling until the regional block has worn off, but is ok to remove it for use with his walker and and weight-bear through that arm immediately as necessary for mobilization.  Otherwise when in bed and sleeping he should wear the sling for 1 month.  No lifting over 2 pounds with the right arm.  May begin active hand, elbow and wrist range of motion as tolerated and should be encouraged to do so early on.  Will see him back in the office in 2 weeks for x-rays and routine postoperative follow-up.

## 2023-02-21 NOTE — Anesthesia Procedure Notes (Signed)
Anesthesia Regional Block: Interscalene brachial plexus block   Pre-Anesthetic Checklist: , timeout performed,  Correct Patient, Correct Site, Correct Laterality,  Correct Procedure, Correct Position, site marked,  Risks and benefits discussed,  Surgical consent,  Pre-op evaluation,  At surgeon's request and post-op pain management  Laterality: Right  Prep: chloraprep       Needles:  Injection technique: Single-shot  Needle Type: Echogenic Stimulator Needle     Needle Length: 9cm  Needle Gauge: 21     Additional Needles:   Procedures:,,,, ultrasound used (permanent image in chart),,    Narrative:  Start time: 02/21/2023 3:40 PM End time: 02/21/2023 3:43 PM Injection made incrementally with aspirations every 5 mL.  Performed by: Personally  Anesthesiologist: Linton Rump, MD  Additional Notes: Discussed risks and benefits of nerve block including, but not limited to, prolonged and/or permanent nerve injury involving sensory and/or motor function. Monitors were applied and a time-out was performed. The nerve and associated structures were visualized under ultrasound guidance. After negative aspiration, local anesthetic was slowly injected around the nerve. There was no evidence of high pressure during the procedure. There were no paresthesias. VSS remained stable and the patient tolerated the procedure well.

## 2023-02-21 NOTE — Anesthesia Postprocedure Evaluation (Signed)
Anesthesia Post Note  Patient: Steven Bradley  Procedure(s) Performed: REVERSE SHOULDER ARTHROPLASTY (Right: Shoulder)     Patient location during evaluation: PACU Anesthesia Type: General Level of consciousness: awake Pain management: pain level controlled Vital Signs Assessment: post-procedure vital signs reviewed and stable Respiratory status: spontaneous breathing, nonlabored ventilation and respiratory function stable Cardiovascular status: blood pressure returned to baseline and stable Postop Assessment: no apparent nausea or vomiting Anesthetic complications: no   No notable events documented.  Last Vitals:  Vitals:   02/21/23 1915 02/21/23 1925  BP: 117/71   Pulse: 81 79  Resp: 13 12  Temp:    SpO2: 93% 95%    Last Pain:  Vitals:   02/21/23 1925  TempSrc:   PainSc: 3                  Linton Rump

## 2023-02-21 NOTE — Anesthesia Preprocedure Evaluation (Addendum)
Anesthesia Evaluation  Patient identified by MRN, date of birth, ID band Patient awake    Reviewed: Allergy & Precautions, NPO status , Patient's Chart, lab work & pertinent test results  History of Anesthesia Complications Negative for: history of anesthetic complications  Airway Mallampati: III  TM Distance: >3 FB Neck ROM: Full   Comment: Previous grade III view with MAC 4, easy mask Dental  (+) Dental Advisory Given   Pulmonary neg pulmonary ROS   Pulmonary exam normal breath sounds clear to auscultation       Cardiovascular hypertension, Pt. on medications (-) angina (-) Past MI, (-) Cardiac Stents and (-) CABG + dysrhythmias (prolonged QT, afib with RVR new this admission, required cardizem gtt, now on oral) Atrial Fibrillation  Rhythm:Regular Rate:Normal  HLD  TTE 02/18/2023: IMPRESSIONS    1. Left ventricular ejection fraction, by estimation, is 60 to 65%. The  left ventricle has normal function. The left ventricle has no regional  wall motion abnormalities. There is mild left ventricular hypertrophy.  Left ventricular diastolic parameters  are consistent with Grade I diastolic dysfunction (impaired relaxation).   2. Right ventricular systolic function is low normal. The right  ventricular size is moderately enlarged. Tricuspid regurgitation signal is  inadequate for assessing PA pressure.   3. The mitral valve is grossly normal. Trivial mitral valve  regurgitation. No evidence of mitral stenosis.   4. The aortic valve is tricuspid. Aortic valve regurgitation is not  visualized. No aortic stenosis is present.   5. Aortic dilatation noted. There is borderline dilatation of the aortic  root, measuring 37 mm. There is borderline dilatation of the ascending  aorta, measuring 37 mm.   6. IVC is not visualized.     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, last seizure on day of admission), Poorly Controlled,  PSYCHIATRIC  DISORDERS (PTSD) Anxiety        GI/Hepatic ,GERD  Medicated,,(+)     substance abuse  alcohol useElevated LFTs   Endo/Other  negative endocrine ROS    Renal/GU Renal disease   H/o prostate cancer    Musculoskeletal  (+) Arthritis ,    Abdominal   Peds  Hematology  (+) Blood dyscrasia, anemia Lab Results      Component                Value               Date                      WBC                      12.0 (H)            02/21/2023                HGB                      6.7 (LL)            02/21/2023                HCT                      21.0 (L)            02/21/2023                MCV  106.6 (H)           02/21/2023                PLT                      109 (L)             02/21/2023              Anesthesia Other Findings Hgb 6.7 this am, now s/p 2 units pRBCs  Reproductive/Obstetrics                             Anesthesia Physical Anesthesia Plan  ASA: 3  Anesthesia Plan: General   Post-op Pain Management: Regional block*   Induction: Intravenous  PONV Risk Score and Plan: 2 and Ondansetron and Dexamethasone  Airway Management Planned: Oral ETT  Additional Equipment:   Intra-op Plan:   Post-operative Plan: Extubation in OR  Informed Consent: I have reviewed the patients History and Physical, chart, labs and discussed the procedure including the risks, benefits and alternatives for the proposed anesthesia with the patient or authorized representative who has indicated his/her understanding and acceptance.   Patient has DNR.  Discussed DNR with patient and Suspend DNR.   Dental advisory given  Plan Discussed with: CRNA and Anesthesiologist  Anesthesia Plan Comments: (Discussed potential risks of nerve blocks including, but not limited to, infection, bleeding, nerve damage, seizures, pneumothorax, respiratory depression, and potential failure of the block. Alternatives to nerve blocks discussed. All  questions answered.  Risks of general anesthesia discussed including, but not limited to, sore throat, hoarse voice, chipped/damaged teeth, injury to vocal cords, nausea and vomiting, allergic reactions, lung infection, heart attack, stroke, and death. All questions answered. )        Anesthesia Quick Evaluation

## 2023-02-21 NOTE — H&P (Signed)
H and P update  REQUESTING PHYSICIAN: Tyrone Nine, MD  PCP:  [facility]  Chief Complaint: fall  HPI: Steven Bradley is a 77 y.o. male who complains of right shoulder and left hip pain following a fall.  Questionable seizure activity.  Does have a history of seizure.  My partner Dr. Constance Goltz took care of his hip fracture yesterday with arthroplasty.  Here today for right shoulder reverse arthroplasty for definitive treatment for proximal humerus fracture.  Past Medical History:  Diagnosis Date   Arthritis    Enlarged prostate    GERD (gastroesophageal reflux disease)    Gout    Patient doesn't recall having gout but was prescribed allopurinol for ankle swelling.   Hyperlipidemia    Hypertension    Prostate cancer [facility]) June 2015   PTSD (post-traumatic stress disorder)    Syncope    Negative outpatient workup.   Vitamin D deficiency    Past Surgical History:  Procedure Laterality Date   PROSTATE BIOPSY  June 2015   PROSTATE SURGERY     Right hand surgery     Scrapnel removal from scalp     During [place] war   TOTAL HIP ARTHROPLASTY Left 02/20/2023   Procedure: TOTAL HIP ARTHROPLASTY ANTERIOR APPROACH;  Surgeon: Durene Romans, MD;  Location: [facility];  Service: Orthopedics;  Laterality: Left;   Social History   Socioeconomic History   Marital status: Married    Spouse name: Not on file   Number of children: Not on file   Years of education: Not on file   Highest education level: Not on file  Occupational History   Not on file  Tobacco Use   Smoking status: Never   Smokeless tobacco: Never  Vaping Use   Vaping status: Never Used  Substance and Sexual Activity   Alcohol use: Yes    Comment: occasionaly   Drug use: No   Sexual activity: Yes    Birth control/protection: None  Other Topics Concern   Not on file  Social History Narrative   Are you right handed or left handed? Right    Are you currently employed ? Retired    What is your current  occupation? [employer]   Do you live at home alone? No    Who lives with you? Wife    What type of home do you live in: 1 story or 2 story?  1 story        Social Determinants of Health   Financial Resource Strain: Low Risk  (05/05/2022)   Received from [facility], [facility]   Overall Financial Resource Strain (CARDIA)    Difficulty of Paying Living Expenses: Not very hard  Food Insecurity: No Food Insecurity (02/17/2023)   Hunger Vital Sign    Worried About Running Out of Food in the Last Year: Never true    Ran Out of Food in the Last Year: Never true  Transportation Needs: No Transportation Needs (02/17/2023)   PRAPARE - Administrator, Civil Service (Medical): No    Lack of Transportation (Non-Medical): No  Physical Activity: Not on file  Stress: No Stress Concern Present (04/29/2022)   Received from [facility], [facility] - Occupational Stress Questionnaire    Feeling of Stress : Only a little  Social Connections: Unknown (04/29/2022)   Received from [facility], [facility]   Social Network    Social Network: Not on file  Family History  Problem Relation Age of Onset   Cancer Father    Allergies  Allergen Reactions   Viagra [Sildenafil] Nausea And Vomiting   Ciprofloxacin Rash   Spironolactone Rash   Vitamin D Analogs Rash   Prior to Admission medications   Medication Sig Start Date End Date Taking? Authorizing Provider  allopurinol (ZYLOPRIM) 100 MG tablet Take 100 mg by mouth daily. 06/01/22  Yes [provider]  amLODipine (NORVASC) 5 MG tablet Take 5 mg by mouth daily. 06/01/22  Yes [provider]  capsaicin (ZOSTRIX) 0.025 % cream Apply 1 Application topically 2 (two) times daily as needed (skin). 09/27/22  Yes [provider]  colchicine 0.6 MG tablet Take 0.6 mg by mouth daily as needed (gout). 05/08/20  Yes [provider]  furosemide (LASIX) 40 MG tablet Take 40 mg  by mouth daily as needed for fluid or edema.   Yes [provider]  gabapentin (NEURONTIN) 100 MG capsule Take 100 mg by mouth daily as needed (neuropathy).   Yes [provider]  levETIRAcetam (KEPPRA) 1000 MG tablet Take 1,000 mg by mouth 2 (two) times daily. 05/10/22  Yes [provider]  magnesium oxide (MAG-OX) 400 (241.3 Mg) MG tablet Take 400 mg by mouth every evening.   Yes [provider]  Multiple Vitamin (MULTIVITAMIN WITH MINERALS) TABS tablet Take 1 tablet by mouth daily.   Yes [provider]  omeprazole (PRILOSEC) 20 MG capsule Take 2 capsules (40 mg total) by mouth 2 (two) times daily before a meal. 12/24/18  Yes Marinda Elk, MD  sertraline (ZOLOFT) 100 MG tablet Take 100 mg by mouth daily.   Yes [provider]  Tetrahydroz-Dextran-PEG-Povid 0.05-0.1-1-1 % SOLN Place 1 drop into both eyes daily.   Yes [provider]  traZODone (DESYREL) 100 MG tablet Take 100 mg by mouth at bedtime.   Yes [provider]   DG C-Arm 1-60 Min-No Report  Result Date: 02/20/2023 Fluoroscopy was utilized by the requesting physician.  No radiographic interpretation.   DG Pelvis Portable  Result Date: 02/20/2023 CLINICAL DATA:  Postop hip arthroplasty. EXAM: PORTABLE PELVIS 1 VIEWS COMPARISON:  02/17/2023. FINDINGS: Status post left total hip arthroplasty right hip degenerative changes with osteophytes and joint space narrowing. Pelvic ring intact. No acute fractures. IMPRESSION: Degenerative changes on the right. Status post left total hip arthroplasty. Electronically Signed   By: Layla Maw M.D.   On: 02/20/2023 12:25   DG HIP UNILAT WITH PELVIS 1V LEFT  Result Date: 02/20/2023 CLINICAL DATA:  Fluoro guidance provided EXAM: DG HIP (WITH OR WITHOUT PELVIS) 1V*L* FINDINGS: Dose: Cumulative Air Kerma 2.4mG y Fluoro time 11s IMPRESSION: C-arm fluoro guidance provided. Electronically Signed   By: Layla Maw M.D.    On: 02/20/2023 12:24   DG C-Arm 1-60 Min-No Report  Result Date: 02/20/2023 Fluoroscopy was utilized by the requesting physician.  No radiographic interpretation.    Positive ROS: All other systems have been reviewed and were otherwise negative with the exception of those mentioned in the HPI and as above.  Physical Exam: General: Alert, no acute distress Cardiovascular: No pedal edema Respiratory: No cyanosis, no use of accessory musculature GI: No organomegaly, abdomen is soft and non-tender Skin: No lesions in the area of chief complaint Neurologic: Sensation intact distally Psychiatric: Patient is competent for consent with normal mood and affect Lymphatic: No axillary or cervical lymphadenopathy  MUSCULOSKELETAL:  Right upper extremity is swollen and bruised consistent with injury.  Neurovascular  intact.  Assessment: Three-part right proximal humerus fracture  Plan: Plan to proceed today with arthroplasty of the right shoulder with reverse for definitive treatment.  We discussed risk of bleeding, infection, damage to surrounding nerves and vessels, stiffness, fracture, dislocation, need for revision surgery as well as the risk of anesthesia.  He has provided informed consent.  Return to hospitalist service postop.    Yolonda Kida, MD Cell [phone]    02/21/2023 4:11 PM

## 2023-02-21 NOTE — Transfer of Care (Signed)
Immediate Anesthesia Transfer of Care Note  Patient: Steven Bradley  Procedure(s) Performed: REVERSE SHOULDER ARTHROPLASTY (Right: Shoulder)  Patient Location: PACU  Anesthesia Type:General  Level of Consciousness: awake  Airway & Oxygen Therapy: Patient Spontanous Breathing  Post-op Assessment: Report given to RN  Post vital signs: Reviewed and stable  Last Vitals:  Vitals Value Taken Time  BP 133/80 02/21/23 1847  Temp 36.4   Pulse 86 02/21/23 1849  Resp 16 02/21/23 1849  SpO2 100 % 02/21/23 1849  Vitals shown include unfiled device data.  Last Pain:  Vitals:   02/21/23 1557  TempSrc: Oral  PainSc:       Patients Stated Pain Goal: 3 (02/21/23 1432)  Complications: No notable events documented.

## 2023-02-21 NOTE — Evaluation (Signed)
Physical Therapy Evaluation Patient Details Name: Steven Bradley MRN: [id number] DOB: 10-Oct-1945 Today's Date: 02/21/2023  History of Present Illness  Pt is 77 yo male admitted on 02/17/23 after fall and seizure.  Pt found to have L femur fx - s/p anterior THA 12/8 and R humerus fx - surgery scheduled 12/9.  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 At baseline, pt resides with wife and needs varied levels of assist depending on the day but was walking in his home some.  Today, pt motivated to work with therapy and had good pain control.  Pt with shoulder surgery scheduled later today and is getting PRBC so performed limited eval.  Had assist of 2 for safety and pt able to stand at EOB and take a few lateral steps.   Considering multiple injuries and comorbidities - pt did well for POD #1.  Pt currently with functional limitations due to the deficits listed below (see PT Problem List). Pt will benefit from acute skilled PT to increase their independence and safety with mobility to allow discharge.  Patient will benefit from continued inpatient follow up therapy, <3 hours/day at d/c.  Family mentioned preferring SNF over AIR for convenience/location.          If plan is discharge home, recommend the following: A lot of help with walking and/or transfers;A lot of help with bathing/dressing/bathroom;Assistance with cooking/housework;Help with stairs or ramp for entrance   Can travel by private vehicle   No    Equipment Recommendations None recommended by PT  Recommendations for Other Services       Functional Status Assessment Patient has had a recent decline in their functional status and demonstrates the ability to make significant improvements in function in a reasonable and predictable amount of time.     Precautions / Restrictions Precautions Precautions:  Fall Restrictions Weight Bearing Restrictions: No RUE Weight Bearing: Non weight bearing LLE Weight Bearing: Weight bearing as tolerated Other Position/Activity Restrictions: Will need further clarification R UE after sx      Mobility  Bed Mobility Overal bed mobility: Needs Assistance Bed Mobility: Supine to Sit, Sit to Supine     Supine to sit: Mod assist Sit to supine: Mod assist   General bed mobility comments: Cues for safety, not using R UE, and careful with L UE due to IV and getting PRBC    Transfers Overall transfer level: Needs assistance Equipment used: 2 person hand held assist Transfers: Sit to/from Stand Sit to Stand: Mod assist, From elevated surface           General transfer comment: Pt holding tech hand on L and therapist supporting with gait belt on R. Stood with mod A and bed elevated    Ambulation/Gait Ambulation/Gait assistance: Mod assist, +2 safety/equipment Gait Distance (Feet): 3 Feet Assistive device: 2 person hand held assist   Gait velocity: decreased     General Gait Details: Pt holding tech hand on L and therapist supporting with gait belt on R.; only side steps at EOB; tolerated weight on L LE well  Stairs            Wheelchair Mobility     Tilt Bed    Modified Rankin (Stroke Patients Only)       Balance Overall balance assessment: Needs assistance Sitting-balance support: No upper extremity supported Sitting balance-Leahy Scale: Fair  Standing balance support: Single extremity supported Standing balance-Leahy Scale: Poor Standing balance comment: UE support and mod A                             Pertinent Vitals/Pain Pain Assessment Pain Assessment: 0-10 Pain Score: 1  Pain Location: R UE adn L LE Pain Descriptors / Indicators: Discomfort Pain Intervention(s): Limited activity within patient's tolerance, Monitored during session, Premedicated before session    Home Living Family/patient  expects to be discharged to:: Private residence Living Arrangements: Spouse/significant other Available Help at Discharge: Family;Available 24 hours/day Type of Home: House Home Access: Ramped entrance     Alternate Level Stairs-Number of Steps: chair lift to go to bedroom Home Layout: Multi-level Home Equipment: Shower seat;BSC/3in1;Wheelchair - Forensic psychologist (2 wheels);Cane - single point;Rollator (4 wheels) Additional Comments: patient reported that he is getting fitted for motorized scooter and applying for handicapped [facility] Niece (this week but now postponned)    Prior Function Prior Level of Function : Needs assist             Mobility Comments: Wife reporting varied levels of assist required depending on the day.  Reports 7-8 months of mostly being in bed and requiring assist of 2 for transfers at times (States legs giving out as reason for limited mobility.), but recently started moving more and walking short distance with RW (~25') and moving easier.  Did still need some assist for transfers and ambulation at times. ADLs Comments: Assist for ADLs and IADLS     Extremity/Trunk Assessment   Upper Extremity Assessment Upper Extremity Assessment: RUE deficits/detail;LUE deficits/detail RUE Deficits / Details: R UE in sling, moving R hand and elbow without difficulty LUE Deficits / Details: ROM WFL; MMT 5/5    Lower Extremity Assessment Lower Extremity Assessment: LLE deficits/detail;RLE deficits/detail RLE Deficits / Details: ROM WFL; MMT 5/5 LLE Deficits / Details: Expected post op changes; ROM WFL; MMT: ankle 5/5, knee and hip 3/5    Cervical / Trunk Assessment Cervical / Trunk Assessment: Normal  Communication      Cognition Arousal: Alert Behavior During Therapy: Flat affect Overall Cognitive Status: Within Functional Limits for tasks assessed                                          General Comments General comments (skin integrity, edema,  etc.): Pt had hgb of 6.7 this morning and is gettign PRBC with plan for shoudler surgery this afternoon - performed limited session with cautious/slow transitions.  Pt tolerated well.    Exercises General Exercises - Lower Extremity Ankle Circles/Pumps: AROM, Both, 10 reps, Seated Long Arc Quad: AROM, Both, 10 reps, Seated Hip Flexion/Marching: AROM, Both, 10 reps, Seated   Assessment/Plan    PT Assessment Patient needs continued PT services  PT Problem List Decreased strength;Pain;Decreased cognition;Decreased range of motion;Decreased activity tolerance;Decreased balance;Decreased mobility;Decreased safety awareness;Decreased knowledge of use of DME       PT Treatment Interventions DME instruction;Therapeutic exercise;Gait training;Balance training;Functional mobility training;Therapeutic activities;Patient/family education;Modalities;Neuromuscular re-education;Wheelchair mobility training    PT Goals (Current goals can be found in the Care Plan section)  Acute Rehab PT Goals Patient Stated Goal: Open to SNF at d/c (prefer SNF over AIR); hopeful to progress back to walking PT Goal Formulation: With patient/family Time For Goal Achievement: 03/07/23 Potential to Achieve Goals: Good  Frequency Min 1X/week     Co-evaluation               AM-PAC PT "6 Clicks" Mobility  Outcome Measure Help needed turning from your back to your side while in a flat bed without using bedrails?: A Lot Help needed moving from lying on your back to sitting on the side of a flat bed without using bedrails?: A Lot Help needed moving to and from a bed to a chair (including a wheelchair)?: Total Help needed standing up from a chair using your arms (e.g., wheelchair or bedside chair)?: Total Help needed to walk in hospital room?: Total Help needed climbing 3-5 steps with a railing? : Total 6 Click Score: 8    End of Session Equipment Utilized During Treatment: Gait belt Activity Tolerance:  Patient tolerated treatment well Patient left: in bed;with call bell/phone within reach;with bed alarm set;with SCD's reapplied Nurse Communication: Mobility status PT Visit Diagnosis: Other abnormalities of gait and mobility (R26.89);Muscle weakness (generalized) (M62.81)    Time: 1610-9604 PT Time Calculation (min) (ACUTE ONLY): 33 min   Charges:   PT Evaluation $PT Eval Moderate Complexity: 1 Mod PT Treatments $Therapeutic Activity: 8-22 mins PT General Charges $$ ACUTE PT VISIT: 1 Visit         Anise Salvo, PT Acute Rehab [facility] [phone]   Rayetta Humphrey 02/21/2023, 12:50 PM

## 2023-02-22 ENCOUNTER — Other Ambulatory Visit: Payer: Self-pay

## 2023-02-22 DIAGNOSIS — R569 Unspecified convulsions: Secondary | ICD-10-CM | POA: Diagnosis not present

## 2023-02-22 LAB — CBC WITH DIFFERENTIAL/PLATELET
Abs Immature Granulocytes: 0.1 10*3/uL — ABNORMAL HIGH (ref 0.00–0.07)
Basophils Absolute: 0 10*3/uL (ref 0.0–0.1)
Basophils Relative: 0 %
Eosinophils Absolute: 0 10*3/uL (ref 0.0–0.5)
Eosinophils Relative: 0 %
HCT: 26.8 % — ABNORMAL LOW (ref 39.0–52.0)
Hemoglobin: 8.8 g/dL — ABNORMAL LOW (ref 13.0–17.0)
Immature Granulocytes: 1 %
Lymphocytes Relative: 7 %
Lymphs Abs: 0.7 10*3/uL (ref 0.7–4.0)
MCH: 32.6 pg (ref 26.0–34.0)
MCHC: 32.8 g/dL (ref 30.0–36.0)
MCV: 99.3 fL (ref 80.0–100.0)
Monocytes Absolute: 1.6 10*3/uL — ABNORMAL HIGH (ref 0.1–1.0)
Monocytes Relative: 17 %
Neutro Abs: 7 10*3/uL (ref 1.7–7.7)
Neutrophils Relative %: 75 %
Platelets: 119 10*3/uL — ABNORMAL LOW (ref 150–400)
RBC: 2.7 MIL/uL — ABNORMAL LOW (ref 4.22–5.81)
RDW: 17.1 % — ABNORMAL HIGH (ref 11.5–15.5)
WBC: 9.4 10*3/uL (ref 4.0–10.5)
nRBC: 0 % (ref 0.0–0.2)

## 2023-02-22 LAB — COMPREHENSIVE METABOLIC PANEL
ALT: 47 U/L — ABNORMAL HIGH (ref 0–44)
AST: 101 U/L — ABNORMAL HIGH (ref 15–41)
Albumin: 2.6 g/dL — ABNORMAL LOW (ref 3.5–5.0)
Alkaline Phosphatase: 165 U/L — ABNORMAL HIGH (ref 38–126)
Anion gap: 7 (ref 5–15)
BUN: 23 mg/dL (ref 8–23)
CO2: 21 mmol/L — ABNORMAL LOW (ref 22–32)
Calcium: 8.2 mg/dL — ABNORMAL LOW (ref 8.9–10.3)
Chloride: 99 mmol/L (ref 98–111)
Creatinine, Ser: 0.93 mg/dL (ref 0.61–1.24)
GFR, Estimated: >60 mL/min (ref >60)
Glucose, Bld: 166 mg/dL — ABNORMAL HIGH (ref 70–99)
Potassium: 4.2 mmol/L (ref 3.5–5.1)
Sodium: 127 mmol/L — ABNORMAL LOW (ref 135–145)
Total Bilirubin: 1.6 mg/dL — ABNORMAL HIGH (ref <1.2)
Total Protein: 6.9 g/dL (ref 6.5–8.1)

## 2023-02-22 LAB — GLUCOSE, CAPILLARY
Glucose-Capillary: 115 mg/dL — ABNORMAL HIGH (ref 70–99)
Glucose-Capillary: 141 mg/dL — ABNORMAL HIGH (ref 70–99)
Glucose-Capillary: 189 mg/dL — ABNORMAL HIGH (ref 70–99)
Glucose-Capillary: 191 mg/dL — ABNORMAL HIGH (ref 70–99)

## 2023-02-22 LAB — TYPE AND SCREEN
ABO/RH(D): O POS
Antibody Screen: NEGATIVE
Unit division: 0
Unit division: 0

## 2023-02-22 LAB — BPAM RBC
Blood Product Expiration Date: 202412302359
Blood Product Expiration Date: 202501032359
ISSUE DATE / TIME: 202412090938
ISSUE DATE / TIME: 202412091301
Unit Type and Rh: 5100
Unit Type and Rh: 5100

## 2023-02-22 MED ORDER — CHLORDIAZEPOXIDE HCL 5 MG PO CAPS
5.0000 mg | ORAL_CAPSULE | Freq: Three times a day (TID) | ORAL | Status: AC
  Administered 2023-02-22 – 2023-02-23 (×5): 5 mg via ORAL
  Filled 2023-02-22 (×4): qty 1

## 2023-02-22 NOTE — Evaluation (Signed)
Occupational Therapy Evaluation Patient Details Name: Steven Bradley MRN: [id number] DOB: [birth year] Today's Date: 02/22/2023   History of Present Illness Pt is 77 yo male admitted on 02/17/23 after fall and seizure.  Pt found to have L femur fx - s/p anterior THA 12/8 and R humerus fx. patient underwent total shoulder surgery on 12/9. Pt with hx including seizures, HTN, HLD, prostate CA, EtOH abuse and alcohol associated hepatic ascites, and gout   Clinical Impression   Patient is a 77 year old male who was admitted for above.  Patient was living at home with wife prior level. Currently, patient was able to engage in transfer to recliner with +2 for safety and physical A to slow landing into recliner. Patient and wife were educated on UB Dressing,sling positioning and ice machine management. Patient and wife were provided with hand outs. Block was still in place on RUE at this time. Per Dr.Rogers "remain in his sling until the regional block has worn off, but is ok to remove it for use with his walker and and weight-bear through that arm immediately as necessary for mobilization."Patient was noted to have decreased functional activity tolerance, decreased endurance, decreased standing balance, decreased safety awareness, and decreased knowledge of AD/AE impacting participation in ADLs. Patient will benefit from continued inpatient follow up therapy, <3 hours/day     If plan is discharge home, recommend the following: Two people to help with walking and/or transfers;A lot of help with bathing/dressing/bathroom;Assistance with cooking/housework;Direct supervision/assist for medications management;Assist for transportation;Help with stairs or ramp for entrance;Direct supervision/assist for financial management    Functional Status Assessment  Patient has had a recent decline in their functional status and demonstrates the ability to make significant improvements in function in a reasonable and  predictable amount of time.  Equipment Recommendations  None recommended by OT       Precautions / Restrictions Precautions Precautions: Fall;Shoulder Type of Shoulder Precautions: NO shoulder ROM, ok for hand, wrist, and elbow, sling can come off and patient can use RUE on walker after block has worn off per Dr.Rogers note 12/9 Shoulder Interventions: Shoulder sling/immobilizer;Off for dressing/bathing/exercises;At all times Required Braces or Orthoses: Sling Restrictions Weight Bearing Restrictions: Yes RUE Weight Bearing: Non weight bearing LLE Weight Bearing: Weight bearing as tolerated      Mobility Bed Mobility Overal bed mobility: Needs Assistance Bed Mobility: Supine to Sit     Supine to sit: Max assist, HOB elevated     General bed mobility comments: Cues for safety, R UE restrictions with sling donned, and increased time with A for LE and trunk        Balance Overall balance assessment: Needs assistance Sitting-balance support: No upper extremity supported Sitting balance-Leahy Scale: Fair   Postural control: Posterior lean Standing balance support: Single extremity supported Standing balance-Leahy Scale: Poor Standing balance comment: UE support and CGA x 2 with cues for extension posture with wide BOS and pt self limiting LLE WB with pt able to participate with pre-gait weight shifting tasks prior to SPT to recliner. standing tolerance 1;22                           ADL either performed or assessed with clinical judgement   ADL Overall ADL's : Needs assistance/impaired Eating/Feeding: Set up;Sitting   Grooming: Minimal assistance;Sitting   Upper Body Bathing: Sitting;Maximal assistance Upper Body Bathing Details (indicate cue type and reason): with education on leaning shoulders slightly forwards to  allow for hygiene to take place. Lower Body Bathing: Total assistance;Bed level   Upper Body Dressing : Sitting;Maximal assistance Upper Body  Dressing Details (indicate cue type and reason): with education on starting on R side first. wife reported being CNA and havig some awareness of these things already. Lower Body Dressing: Bed level;Total assistance   Toilet Transfer: +2 for physical assistance;+2 for safety/equipment;Minimal assistance Toilet Transfer Details (indicate cue type and reason): patient was min A +2 for sit to stand and to transfer to relciner in room with on HHA. patient needed increased A for lowering into recliner.                  Pertinent Vitals/Pain Pain Assessment Pain Assessment: Faces Faces Pain Scale: Hurts even more Pain Location: RUE Pain Descriptors / Indicators: Discomfort, Sore, Operative site guarding Pain Intervention(s): Limited activity within patient's tolerance, Monitored during session, Premedicated before session, Repositioned, Ice applied     Extremity/Trunk Assessment Upper Extremity Assessment Upper Extremity Assessment: RUE deficits/detail RUE Deficits / Details: able to wiggle digits, no control of forearm yet.       Cervical / Trunk Assessment Cervical / Trunk Assessment: Normal   Communication     Cognition Arousal: Alert Behavior During Therapy: Flat affect Overall Cognitive Status: Within Functional Limits for tasks assessed             General Comments: patients wife was present during session. patient was plesant and followed directions during session. patient oriented to date and place.           Shoulder Instructions Shoulder Instructions Donning/doffing shirt without moving shoulder: Maximal assistance;Caregiver independent with task Method for sponge bathing under operated UE: Maximal assistance;Caregiver independent with task Donning/doffing sling/immobilizer: Maximal assistance;Caregiver independent with task Correct positioning of sling/immobilizer: Maximal assistance;Caregiver independent with task    Home Living Family/patient expects to  be discharged to:: Private residence Living Arrangements: Spouse/significant other Available Help at Discharge: Family;Available 24 hours/day Type of Home: House Home Access: Ramped entrance     Home Layout: Multi-level Alternate Level Stairs-Number of Steps: chair lift to go to bedroom   Bathroom Shower/Tub: Arts development officer Toilet: Handicapped height     Home Equipment: Shower seat;BSC/3in1;Wheelchair - Forensic psychologist (2 wheels);Cane - single point;Rollator (4 wheels)          Prior Functioning/Environment Prior Level of Function : Needs assist               ADLs Comments: patients wife reported that she helps with IADLs. patient's wife concerned with patient's varying levels of assistance with tasks PLOF.        OT Problem List: Decreased activity tolerance;Impaired balance (sitting and/or standing);Decreased coordination;Decreased safety awareness;Decreased knowledge of use of DME or AE;Pain;Decreased knowledge of precautions;Impaired UE functional use      OT Treatment/Interventions: Self-care/ADL training;Therapeutic exercise;DME and/or AE instruction;Therapeutic activities;Patient/family education;Balance training    OT Goals(Current goals can be found in the care plan section) Acute Rehab OT Goals Patient Stated Goal: none stated OT Goal Formulation: With patient/family Time For Goal Achievement: 03/08/23 Potential to Achieve Goals: Fair  OT Frequency: 7X/week    Co-evaluation PT/OT/SLP Co-Evaluation/Treatment: Yes Reason for Co-Treatment: To address functional/ADL transfers PT goals addressed during session: Mobility/safety with mobility OT goals addressed during session: ADL's and self-care      AM-PAC OT "6 Clicks" Daily Activity     Outcome Measure Help from another person eating meals?: A Little Help from another person taking care of  personal grooming?: A Lot Help from another person toileting, which includes using toliet, bedpan,  or urinal?: A Lot Help from another person bathing (including washing, rinsing, drying)?: A Lot Help from another person to put on and taking off regular upper body clothing?: A Lot Help from another person to put on and taking off regular lower body clothing?: A Lot 6 Click Score: 13   End of Session Equipment Utilized During Treatment: Gait belt;Other (comment) (sling)  Activity Tolerance: Patient tolerated treatment well Patient left: in chair;with call bell/phone within reach;with family/visitor present;with nursing/sitter in room  OT Visit Diagnosis: Unsteadiness on feet (R26.81);Other abnormalities of gait and mobility (R26.89);Muscle weakness (generalized) (M62.81);Pain                Time: 4098-1191 OT Time Calculation (min): 32 min Charges:  OT General Charges $OT Visit: 1 Visit OT Evaluation $OT Eval Moderate Complexity: 1 Mod  Jennalynn Rivard OTR/L, MS Acute Rehabilitation Department Office# [phone]   Selinda Flavin 02/22/2023, 5:02 PM

## 2023-02-22 NOTE — TOC Progression Note (Signed)
Transition of Care [facility]) - Progression Note    Patient Details  Name: Steven Bradley MRN: [id number] Date of Birth: Aug 08, 1945  [facility]) CM/SW Contact  Otelia Santee, LCSW Phone Number: 02/22/2023, 12:59 PM  Clinical Narrative:    Reviewed bed offer with pt and spouse. Pt has accepted bed offer for [facility].  CSW left VM for SW at the [place] to check status of insurance approval.    Expected Discharge Plan: Skilled Nursing Facility Barriers to Discharge: Continued Medical Work up, SNF Pending bed offer  Expected Discharge Plan and Services In-house Referral: Clinical Social Work Discharge Planning Services: NA Post Acute Care Choice: Skilled Nursing Facility Living arrangements for the past 2 months: Mobile Home (RV)                 DME Arranged: N/A DME Agency: NA                   Social Determinants of Health (SDOH) Interventions SDOH Screenings   Food Insecurity: No Food Insecurity (02/17/2023)  Housing: Patient Declined (02/17/2023)  Transportation Needs: No Transportation Needs (02/17/2023)  Utilities: Not At Risk (02/17/2023)  Financial Resource Strain: Low Risk  (05/05/2022)   Received from [facility], [facility]  Social Connections: Unknown (04/29/2022)   Received from [facility], [facility]  Stress: No Stress Concern Present (04/29/2022)   Received from [facility], [facility]  Tobacco Use: Low Risk  (02/21/2023)    Readmission Risk Interventions    02/21/2023    2:03 PM 10/19/2022    2:45 PM  Readmission Risk Prevention Plan  Post Dischage Appt  Complete  Medication Screening  Complete  Transportation Screening Complete Complete  PCP or Specialist Appt within 5-7 Days Complete   Home Care Screening Complete   Medication Review (RN CM) Complete

## 2023-02-22 NOTE — Progress Notes (Addendum)
1102-Patient is ordered cardizem for a fib  wife states he doesn't have a fib,  pt states he doesn't either.   When educated on medications  and rationale  pt wife and pt refused medication Notified md of refusal , and charge Rn shanice notified   pt refuses to take med  as wife states very adamantly states no he cannot take the med  Will attempt to reducate   Notified md that maybe if he assisted with education it may be beneficial   1140-Called tele   reviewed strips  shay tele tech   made md aware     will reattempt to educate

## 2023-02-22 NOTE — Progress Notes (Signed)
Pt calm resting in bed  wife at bedside  no distress noted at this time  ice man on pt shoulder  scd on and in use   wife at bedside  safety measures in place  call bell within reach  handoff report completed at bedside with thomas ryan Rn

## 2023-02-22 NOTE — Progress Notes (Signed)
Physical Therapy Treatment Patient Details Name: Steven Bradley MRN: [id number] DOB: 04/01/1945 Today's Date: 02/22/2023   History of Present Illness Pt is 77 yo male admitted on 02/17/23 after fall and seizure.  Pt found to have L femur fx - s/p anterior THA 12/8 and R humerus fx and subsequent R reverse TSA on 12/9.  Pt with hx including seizures, HTN, HLD, prostate CA, EtOH abuse and alcohol associated hepatic ascites, and gout    PT Comments   Pt admitted with above diagnosis.  Pt currently with functional limitations due to the deficits listed below (see PT Problem List). Pt in bed when PT arrived. Spouse present and assisting pt with ADLs. Pt required max A for supine to sit, min A x 2 for sit to stand  from elevated EOB, min A x 2 for safety with SPT bed to recliner with pt exhibiting poor eccentric control to sitting surface. Pt required cues for R UE restrictions. Pt left seated in recliner, spouse present,  all needs in place with ice man on R UE and 2 CP on L LE. Pt will benefit from acute skilled PT to increase their independence and safety with mobility to allow discharge.      If plan is discharge home, recommend the following: A lot of help with walking and/or transfers;A lot of help with bathing/dressing/bathroom;Assistance with cooking/housework;Help with stairs or ramp for entrance   Can travel by private vehicle     No  Equipment Recommendations  None recommended by PT    Recommendations for Other Services       Precautions / Restrictions Precautions Precautions: Fall Required Braces or Orthoses: Sling (R UE sling) Restrictions Weight Bearing Restrictions: Yes RUE Weight Bearing: Non weight bearing (once block has worn off pt is R UE WBAT for use of RW for gait and transfer tasks only) LLE Weight Bearing: Weight bearing as tolerated     Mobility  Bed Mobility Overal bed mobility: Needs Assistance Bed Mobility: Supine to Sit     Supine to sit: Max assist, HOB  elevated     General bed mobility comments: Cues for safety, R UE restrictions with sling donned, and increased time with A for LE and trunk    Transfers Overall transfer level: Needs assistance Equipment used: 1 person hand held assist (L UE HHA) Transfers: Sit to/from Stand, Bed to chair/wheelchair/BSC Sit to Stand: From elevated surface, Min assist, +2 safety/equipment, +2 physical assistance Stand pivot transfers: Min assist, +2 physical assistance, +2 safety/equipment         General transfer comment: cues, increased time, R UE NWB in sling, HHA L UE and absent eccentric controll to recliner    Ambulation/Gait               General Gait Details: NT today   Stairs             Wheelchair Mobility     Tilt Bed    Modified Rankin (Stroke Patients Only)       Balance Overall balance assessment: Needs assistance Sitting-balance support: No upper extremity supported Sitting balance-Leahy Scale: Fair   Postural control: Posterior lean Standing balance support: Single extremity supported Standing balance-Leahy Scale: Poor Standing balance comment: UE support and CGA x 2 with cues for extension posture with wide BOS and pt self limiting LLE WB with pt able to participate with pre-gait weight shifting tasks prior to SPT to recliner. standing tolerance 1;22  Cognition Arousal: Alert Behavior During Therapy: Flat affect Overall Cognitive Status: Within Functional Limits for tasks assessed                                 General Comments: spouse present        Exercises      General Comments        Pertinent Vitals/Pain Pain Assessment Pain Assessment: 0-10 Pain Score: 3  Pain Location: R UE and L LE Pain Descriptors / Indicators: Discomfort, Sore, Operative site guarding Pain Intervention(s): Limited activity within patient's tolerance, Monitored during session, Premedicated before session,  Repositioned, Ice applied    Home Living                          Prior Function            PT Goals (current goals can now be found in the care plan section) Acute Rehab PT Goals Patient Stated Goal: Open to SNF at d/c (prefer SNF over AIR); hopeful to progress back to walking PT Goal Formulation: With patient/family Time For Goal Achievement: 03/07/23 Potential to Achieve Goals: Good Progress towards PT goals: Progressing toward goals    Frequency    Min 1X/week      PT Plan      Co-evaluation              AM-PAC PT "6 Clicks" Mobility   Outcome Measure  Help needed turning from your back to your side while in a flat bed without using bedrails?: A Lot Help needed moving from lying on your back to sitting on the side of a flat bed without using bedrails?: A Lot Help needed moving to and from a bed to a chair (including a wheelchair)?: A Lot Help needed standing up from a chair using your arms (e.g., wheelchair or bedside chair)?: A Lot Help needed to walk in hospital room?: Total Help needed climbing 3-5 steps with a railing? : Total 6 Click Score: 10    End of Session Equipment Utilized During Treatment: Gait belt Activity Tolerance: Patient tolerated treatment well Patient left: with call bell/phone within reach;in chair;with chair alarm set;with family/visitor present Nurse Communication: Mobility status PT Visit Diagnosis: Other abnormalities of gait and mobility (R26.89);Muscle weakness (generalized) (M62.81)     Time: 5643-3295 PT Time Calculation (min) (ACUTE ONLY): 32 min  Charges:    $Therapeutic Activity: 23-37 mins PT General Charges $$ ACUTE PT VISIT: 1 Visit                     Johnny Bridge, PT Acute Rehab    Jacqualyn Posey 02/22/2023, 12:31 PM

## 2023-02-22 NOTE — Progress Notes (Signed)
[facility] PROGRESS NOTE  Steven Bradley (DOB: May 12, 1945) ZOX:[id number] PCP: [facility]  Brief Narrative: Steven Bradley is a 77 y.o. male with a history of seizure disorder, HTN, HLD, prostate CA, EtOH abuse and alcohol-associated hepatic ascites who presented to the ED on 02/17/2023 after a fall. His spouse reported hearing a loud noise at the bathroom in their RV, where she found him on the floor, this happened around 9:30 AM, patient reports he struck his face on the toilet, had bleeding and swelling of his lips, there was no loss of consciousness noted, he refused transport by EMS, wife assisted him to the couch where an hour later where he was witnessed to have seizures, lasted for 7 to 8 minutes, it was witnessed by brother-in-law, will describe generalized jerking and shaking, reports patient did not have seizures for last 81-month, and he has been compliant with his meds Keppra,.  Patient with left hip pain, wife thought secondary to gout (x-ray was significant for femur fracture), as well he does endorse right shoulder pain as well which they think it is due to gout as well( shoulder x-ray still pending). -In ED patient was noted to be with significant lip bruise, tongue bleed, he was noted to be in new onset A-fib with RVR, requiring Cardizem drip, he had severe electrolyte derangement loading sodium of 133, potassium of 2.5, magnesium of 0.8, phosphorus of 2.2, anion gap of 19, AST of 247, ALT of 74, total bili of 3.5, his MCV was 100.5, and platelet count was 99K, his x-ray significant for left hip fracture as well, [facility] hospitalist consulted to admit.  He was transported to [facility] for orthopedic surgery. Hospitalization complicated by new onset AFib.   Subjective: Pain is controlled, no palpitations, chest pain or dyspnea.   Objective: BP 130/81   Pulse 78   Temp 98.4 F (36.9 C) (Oral)   Resp 16   Ht 6\' 6"  (1.981 m)   Wt 95 kg   SpO2 97%   BMI 24.20 kg/m    Gen: No distress Pulm: Clear, nonlabored  CV: RRR, no MRG or edema GI: Soft, NT, ND, +BS Neuro: Alert and oriented. No new focal deficits. Ext: Warm, no deformities, RUE in sling with ice applied. LLE ok. WWP throughout.   Assessment & Plan: Principal Problem:   Seizure (HCC) Active Problems:   Hypokalemia   Hyponatremia   Thrombocytopenia (HCC)   Essential hypertension   Elevated LFTs   Hyperlipemia   GERD (gastroesophageal reflux disease)   Macrocytosis   Gout of multiple sites   S/P total left hip arthroplasty  Breakthrough seizure:  - Loaded with, and increased keppra dose per neurology, [name], no EEG required at this time. Continue 1,250mg  BID - Seizure precautions - Will follow up with Dr. Karel Jarvis at [facility].   Right humerus fracture: s/p reverse shoulder arthroplasty 12/9.  - Pain control, weight bearing per ortho  Left femur fracture: s/p anterior-approach THA 12/8 with Dr. Charlann Boxer - Postoperative pain control, weight bearing, wound care, and VTE ppx per orthopedics.  - Will obtain PT/OT evaluations, anticipate rehab requirement. TOC on board.  ABLA and thrombocytopenia: s/p 2u RBCs.  - Check CBC in AM  New, paroxysmal atrial fibrillation with RVR: Resolved, back in NSR. Echo showed LVEF 60-65%, mild LVH, G1DD, no WMA. low-normal RVSF. Normal biatrial size. - Continue oral diltiazem, consolidated. - Holding anticoagulation with severity of fall, EtOH use, and anemia/thrombocytopenia.  EtOH abuse: Recent history per spouse is  less drinking overall, not daily.  - Continue low dose librium taper. CIWA scores remain reassuring. - MVM, folate, thiamine per protocol  Chronic gout: No exacerbation noted at this time.  - Continue home allopurinol.   HTN, HLD: Continue meds as ordered.    Hypokalemia - Repeat supplement   Hypomagnesemia: Improved, monitoring.   Hypophosphatemia: Resolved.   Depression: Continue medications as per pt's spouse.    Hyponatremia:  Continue NS until taking adequate po. Liver disease contributing. - If persistent postoperatively, would get further work up.   Transaminitis - LFT elevation consistent with chronic alcohol abuse   GERD: PPI   Macrocytosis - B12 low normal at 208, will supplement - folate 10.8   Tyrone Nine, MD [facility] [email or web address] 02/22/2023, 8:03 PM

## 2023-02-22 NOTE — Progress Notes (Signed)
   Subjective:  Steven Bradley is a 78 y.o. male, 1 Day Post-Op    s/p Procedure(s): REVERSE SHOULDER ARTHROPLASTY   Patient reports pain as mild.  Doing well, no numbness or tingling. In the sling, still unable to move elbow as block is active.   Objective:   VITALS:   Vitals:   02/21/23 2004 02/22/23 0220 02/22/23 0529 02/22/23 0816  BP: 130/86 109/78 127/72 112/72  Pulse: 75 80 70 76  Resp: 17 17 17 16   Temp: 97.6 F (36.4 C) 97.7 F (36.5 C) 97.8 F (36.6 C) 98.4 F (36.9 C)  TempSrc: Oral Oral Oral Oral  SpO2: 97% 94% 96% 96%  Weight:      Height:        Hospital bed nad  RUE:  Neurovascular intact Sensation intact distally Intact pulses distally Dorsiflexion/Plantar flexion intact Incision: dressing C/D/I Compartment soft Aquacell clean, sling on, decreased deltoid sensation secondary to block  Lab Results  Component Value Date   WBC 9.4 02/22/2023   HGB 8.8 (L) 02/22/2023   HCT 26.8 (L) 02/22/2023   MCV 99.3 02/22/2023   PLT 119 (L) 02/22/2023   BMET    Component Value Date/Time   NA 127 (L) 02/22/2023 0248   K 4.2 02/22/2023 0248   CL 99 02/22/2023 0248   CO2 21 (L) 02/22/2023 0248   GLUCOSE 166 (H) 02/22/2023 0248   BUN 23 02/22/2023 0248   CREATININE 0.93 02/22/2023 0248   CALCIUM 8.2 (L) 02/22/2023 0248   GFRNONAA >60 02/22/2023 0248     Assessment/Plan: 1 Day Post-Op   Principal Problem:   Seizure (HCC) Active Problems:   Hypokalemia   Hyponatremia   Thrombocytopenia (HCC)   Essential hypertension   Elevated LFTs   Hyperlipemia   GERD (gastroesophageal reflux disease)   Macrocytosis   Gout of multiple sites   S/P total left hip arthroplasty   Advance diet Up with therapy   Weightbearing Status:  Steven Bradley will remain in his sling until the regional block has worn off, but is ok to remove it for use with his walker and and weight-bear through that arm immediately as necessary for mobilization.  Otherwise when in bed  and sleeping he should wear the sling for 1 month.  No lifting over 2 pounds with the right arm.  May begin active hand, elbow and wrist range of motion as tolerated and should be encouraged to do so early on.  Will see him back in the office in 2 weeks for x-rays and routine postoperative follow-up.  DVT Prophylaxis: per hospitalist and Dr. Nilsa Nutting team  Dispo: ok for discharge from shoulder perspective, will work with PT and use walker to determine dispo . Therapy  may be a little slower until block wears off completely. dispo per hospitalist.   Follow up in the office in 2 weeks.   [name] 02/22/2023, 11:13 AM  Dion Saucier PA-C  Physician Assistant with Dr. Rebekah [place] [facility]

## 2023-02-22 NOTE — Progress Notes (Signed)
Patient ID: Steven Bradley, male   DOB: December 05, 1945, 77 y.o.   MRN: [id number] Subjective: 1 Day Post-Op Procedure(s) (LRB): REVERSE SHOULDER ARTHROPLASTY (Right)  POD#2 from left THR for fracture    Patient reports pain as mild with regards to his hip  Objective:   VITALS:   Vitals:   02/22/23 0529 02/22/23 0816  BP: 127/72 112/72  Pulse: 70 76  Resp: 17 16  Temp: 97.8 F (36.6 C) 98.4 F (36.9 C)  SpO2: 96% 96%    Neurovascular intact Incision: dressing C/D/I  LABS Recent Labs    02/20/23 0531 02/21/23 0502 02/22/23 0248  HGB 8.9* 6.7* 8.8*  HCT 26.1* 21.0* 26.8*  WBC 7.9 12.0* 9.4  PLT 82* 109* 119*    Recent Labs    02/20/23 0531 02/21/23 0502 02/22/23 0248  NA 128* 126* 127*  K 3.5 4.6 4.2  BUN 15 20 23   CREATININE 0.82 0.94 0.93  GLUCOSE 110* 129* 166*    No results for input(s): "LABPT", "INR" in the last 72 hours.   Assessment/Plan: 1 Day Post-Op Procedure(s) (LRB): REVERSE SHOULDER ARTHROPLASTY (Right)   Up with therapy - per Dr Aundria Rud op note he can weight bear through his RUE with the walker WBAT LLE ASA for DVT prophylaxis as ordered for 4 weeks RTC in 2 weeks Desires home discharge at this point

## 2023-02-22 NOTE — Plan of Care (Signed)
No acute events overnight. Pt's pain well-controlled with current regimen. Problem: Education: Goal: Knowledge of General Education information will improve Description: Including pain rating scale, medication(s)/side effects and non-pharmacologic comfort measures Outcome: Progressing   Problem: Health Behavior/Discharge Planning: Goal: Ability to manage health-related needs will improve Outcome: Progressing   Problem: Clinical Measurements: Goal: Ability to maintain clinical measurements within normal limits will improve Outcome: Progressing Goal: Will remain free from infection Outcome: Progressing Goal: Diagnostic test results will improve Outcome: Progressing Goal: Respiratory complications will improve Outcome: Progressing Goal: Cardiovascular complication will be avoided Outcome: Progressing   Problem: Activity: Goal: Risk for activity intolerance will decrease Outcome: Progressing   Problem: Nutrition: Goal: Adequate nutrition will be maintained Outcome: Progressing   Problem: Coping: Goal: Level of anxiety will decrease Outcome: Progressing   Problem: Elimination: Goal: Will not experience complications related to bowel motility Outcome: Progressing Goal: Will not experience complications related to urinary retention Outcome: Progressing   Problem: Pain Management: Goal: General experience of comfort will improve Outcome: Progressing   Problem: Safety: Goal: Ability to remain free from injury will improve Outcome: Progressing   Problem: Skin Integrity: Goal: Risk for impaired skin integrity will decrease Outcome: Progressing   Problem: Education: Goal: Knowledge of the prescribed therapeutic regimen will improve Outcome: Progressing Goal: Understanding of discharge needs will improve Outcome: Progressing Goal: Individualized Educational Video(s) Outcome: Progressing   Problem: Activity: Goal: Ability to avoid complications of mobility impairment  will improve Outcome: Progressing Goal: Ability to tolerate increased activity will improve Outcome: Progressing   Problem: Clinical Measurements: Goal: Postoperative complications will be avoided or minimized Outcome: Progressing   Problem: Pain Management: Goal: Pain level will decrease with appropriate interventions Outcome: Progressing   Problem: Skin Integrity: Goal: Will show signs of wound healing Outcome: Progressing   Problem: Education: Goal: Knowledge of the prescribed therapeutic regimen will improve Outcome: Progressing Goal: Understanding of activity limitations/precautions following surgery will improve Outcome: Progressing Goal: Individualized Educational Video(s) Outcome: Progressing   Problem: Activity: Goal: Ability to tolerate increased activity will improve Outcome: Progressing   Problem: Pain Management: Goal: Pain level will decrease with appropriate interventions Outcome: Progressing

## 2023-02-23 ENCOUNTER — Encounter (HOSPITAL_COMMUNITY): Payer: Self-pay | Admitting: Orthopedic Surgery

## 2023-02-23 DIAGNOSIS — M1009 Idiopathic gout, multiple sites: Secondary | ICD-10-CM

## 2023-02-23 DIAGNOSIS — E7849 Other hyperlipidemia: Secondary | ICD-10-CM

## 2023-02-23 DIAGNOSIS — R569 Unspecified convulsions: Secondary | ICD-10-CM | POA: Diagnosis not present

## 2023-02-23 DIAGNOSIS — Z96642 Presence of left artificial hip joint: Secondary | ICD-10-CM

## 2023-02-23 DIAGNOSIS — I10 Essential (primary) hypertension: Secondary | ICD-10-CM | POA: Diagnosis not present

## 2023-02-23 DIAGNOSIS — R7989 Other specified abnormal findings of blood chemistry: Secondary | ICD-10-CM | POA: Diagnosis not present

## 2023-02-23 LAB — CBC WITH DIFFERENTIAL/PLATELET
Abs Immature Granulocytes: 0.19 10*3/uL — ABNORMAL HIGH (ref 0.00–0.07)
Basophils Absolute: 0 10*3/uL (ref 0.0–0.1)
Basophils Relative: 0 %
Eosinophils Absolute: 0 10*3/uL (ref 0.0–0.5)
Eosinophils Relative: 0 %
HCT: 25.4 % — ABNORMAL LOW (ref 39.0–52.0)
Hemoglobin: 8.4 g/dL — ABNORMAL LOW (ref 13.0–17.0)
Immature Granulocytes: 2 %
Lymphocytes Relative: 13 %
Lymphs Abs: 1.1 10*3/uL (ref 0.7–4.0)
MCH: 32.6 pg (ref 26.0–34.0)
MCHC: 33.1 g/dL (ref 30.0–36.0)
MCV: 98.4 fL (ref 80.0–100.0)
Monocytes Absolute: 1.6 10*3/uL — ABNORMAL HIGH (ref 0.1–1.0)
Monocytes Relative: 20 %
Neutro Abs: 5.3 10*3/uL (ref 1.7–7.7)
Neutrophils Relative %: 65 %
Platelets: 145 10*3/uL — ABNORMAL LOW (ref 150–400)
RBC: 2.58 MIL/uL — ABNORMAL LOW (ref 4.22–5.81)
RDW: 16.2 % — ABNORMAL HIGH (ref 11.5–15.5)
WBC: 8.2 10*3/uL (ref 4.0–10.5)
nRBC: 0.4 % — ABNORMAL HIGH (ref 0.0–0.2)

## 2023-02-23 LAB — COMPREHENSIVE METABOLIC PANEL
ALT: 67 U/L — ABNORMAL HIGH (ref 0–44)
AST: 180 U/L — ABNORMAL HIGH (ref 15–41)
Albumin: 2.3 g/dL — ABNORMAL LOW (ref 3.5–5.0)
Alkaline Phosphatase: 202 U/L — ABNORMAL HIGH (ref 38–126)
Anion gap: 6 (ref 5–15)
BUN: 24 mg/dL — ABNORMAL HIGH (ref 8–23)
CO2: 23 mmol/L (ref 22–32)
Calcium: 8.3 mg/dL — ABNORMAL LOW (ref 8.9–10.3)
Chloride: 102 mmol/L (ref 98–111)
Creatinine, Ser: 0.91 mg/dL (ref 0.61–1.24)
GFR, Estimated: >60 mL/min (ref >60)
Glucose, Bld: 102 mg/dL — ABNORMAL HIGH (ref 70–99)
Potassium: 4.1 mmol/L (ref 3.5–5.1)
Sodium: 131 mmol/L — ABNORMAL LOW (ref 135–145)
Total Bilirubin: 1.5 mg/dL — ABNORMAL HIGH (ref <1.2)
Total Protein: 6.1 g/dL — ABNORMAL LOW (ref 6.5–8.1)

## 2023-02-23 LAB — GLUCOSE, CAPILLARY
Glucose-Capillary: 86 mg/dL (ref 70–99)
Glucose-Capillary: 120 mg/dL — ABNORMAL HIGH (ref 70–99)
Glucose-Capillary: 110 mg/dL — ABNORMAL HIGH (ref 70–99)

## 2023-02-23 LAB — PHOSPHORUS: Phosphorus: 2.8 mg/dL (ref 2.5–4.6)

## 2023-02-23 LAB — MAGNESIUM: Magnesium: 1.6 mg/dL — ABNORMAL LOW (ref 1.7–2.4)

## 2023-02-23 NOTE — Progress Notes (Signed)
Physical Therapy Treatment Patient Details Name: Steven Bradley MRN: [id number] DOB: 1946-02-04 Today's Date: 02/23/2023   History of Present Illness Pt is 77 yo male admitted on 02/17/23 after fall and seizure.  Pt found to have L femur fx - s/p anterior THA 12/8 and R humerus fx. patient underwent total shoulder surgery on 12/9. Pt with hx including seizures, HTN, HLD, prostate CA, EtOH abuse and alcohol associated hepatic ascites, and gout    PT Comments   Pt admitted with above diagnosis.  Pt currently with functional limitations due to the deficits listed below (see PT Problem List). Pt required increased physical assist today for bed mobility. Pt required min A x 2 for sit to stand  and for gait training tasks with RW and cues of 5 feet. Pt guided thorough LE TE with multimodal cues. Pt left seated in recliner all needs in place, CP on R shoulder and L LE/hip. Wife not present, pt indicates he is going home with HH vs SNF, need to clarify d/c setting. PT continues to recommend continued PT services inpatient follow up therapy, <3 hours/day.  Pt will benefit from acute skilled PT to increase their independence and safety with mobility to allow discharge.      If plan is discharge home, recommend the following: A lot of help with walking and/or transfers;A lot of help with bathing/dressing/bathroom;Assistance with cooking/housework;Help with stairs or ramp for entrance   Can travel by private vehicle     No  Equipment Recommendations  None recommended by PT    Recommendations for Other Services       Precautions / Restrictions Precautions Precautions: Fall;Shoulder Type of Shoulder Precautions: NO shoulder ROM, ok for hand, wrist, and elbow, sling can come off and patient can use RUE on walker after block has worn off per Dr.Rogers note 12/9 Shoulder Interventions: Shoulder sling/immobilizer;Off for dressing/bathing/exercises;At all times Required Braces or Orthoses:  Sling Restrictions Weight Bearing Restrictions: Yes RUE Weight Bearing: Non weight bearing LLE Weight Bearing: Weight bearing as tolerated Other Position/Activity Restrictions: R UE WBAT for use of RW     Mobility  Bed Mobility Overal bed mobility: Needs Assistance Bed Mobility: Supine to Sit     Supine to sit: HOB elevated, Total assist     General bed mobility comments: Cues for safety, R UE restrictions with sling donned, and increased time with A for LE and trunk and B LE, max A to scoot in sitting to EOB    Transfers Overall transfer level: Needs assistance Equipment used: Rolling walker (2 wheels) (L UE HHA) Transfers: Sit to/from Stand Sit to Stand: From elevated surface, Min assist, +2 safety/equipment, +2 physical assistance           General transfer comment: cues, increased time for motor processing and planing, cues for R UE restrictions for standing and then facilitation for R UE to grasp RW. pt required mod A for initital standing with strong L lateral lean and pt able to modify posture with cues and facilitation, noted R UE edema, pt is able to perofrm active R hand, wrist and elbow flexion and extension now that block has worn off    Ambulation/Gait Ambulation/Gait assistance: +2 safety/equipment, Min assist, +2 physical assistance Gait Distance (Feet): 5 Feet Assistive device: Rolling walker (2 wheels) Gait Pattern/deviations: Step-to pattern, Decreased step length - left, Shuffle, Antalgic, Trunk flexed Gait velocity: decreased     General Gait Details: min A x 2 for balance and RW management recliner close  and cues for encouragement   Stairs             Wheelchair Mobility     Tilt Bed    Modified Rankin (Stroke Patients Only)       Balance Overall balance assessment: Needs assistance Sitting-balance support: No upper extremity supported Sitting balance-Leahy Scale: Fair   Postural control: Posterior lean Standing balance support:  Single extremity supported Standing balance-Leahy Scale: Poor Standing balance comment: mod A with B UE support at RW L lateral lean                            Cognition Arousal: Alert Behavior During Therapy: Flat affect Overall Cognitive Status: Within Functional Limits for tasks assessed                                          Exercises Total Joint Exercises Ankle Circles/Pumps: AROM, Both, 10 reps, Seated Quad Sets: AROM, Both, 10 reps, Seated (long sit in recliner) Heel Slides: AROM, Left, 10 reps, Seated (long sit in recliner) Hip ABduction/ADduction: AROM, Both, 10 reps, Seated Long Arc Quad: AROM, Both, 10 reps, Seated    General Comments        Pertinent Vitals/Pain Pain Assessment Pain Assessment: 0-10 Pain Score: 5  Pain Location: RUE Pain Descriptors / Indicators: Discomfort, Sore, Operative site guarding Pain Intervention(s): Limited activity within patient's tolerance, Monitored during session, Premedicated before session, Repositioned, Ice applied    Home Living                          Prior Function            PT Goals (current goals can now be found in the care plan section) Acute Rehab PT Goals Patient Stated Goal: Open to SNF at d/c (prefer SNF over AIR); hopeful to progress back to walking PT Goal Formulation: With patient/family Time For Goal Achievement: 03/07/23 Potential to Achieve Goals: Good Progress towards PT goals: Progressing toward goals    Frequency    Min 1X/week      PT Plan      Co-evaluation PT/OT/SLP Co-Evaluation/Treatment: Yes Reason for Co-Treatment: To address functional/ADL transfers PT goals addressed during session: Mobility/safety with mobility OT goals addressed during session: ADL's and self-care      AM-PAC PT "6 Clicks" Mobility   Outcome Measure  Help needed turning from your back to your side while in a flat bed without using bedrails?: A Lot Help needed  moving from lying on your back to sitting on the side of a flat bed without using bedrails?: A Lot Help needed moving to and from a bed to a chair (including a wheelchair)?: A Lot Help needed standing up from a chair using your arms (e.g., wheelchair or bedside chair)?: A Lot Help needed to walk in hospital room?: A Lot Help needed climbing 3-5 steps with a railing? : Total 6 Click Score: 11    End of Session Equipment Utilized During Treatment: Gait belt Activity Tolerance: Patient limited by pain;Patient limited by fatigue Patient left: with call bell/phone within reach;in chair;with chair alarm set Nurse Communication: Mobility status PT Visit Diagnosis: Other abnormalities of gait and mobility (R26.89);Muscle weakness (generalized) (M62.81)     Time: 8295-6213 PT Time Calculation (min) (ACUTE ONLY): 23 min  Charges:    $  Gait Training: 8-22 mins PT General Charges $$ ACUTE PT VISIT: 1 Visit                     Steven Bradley, PT Acute Rehab   Jacqualyn Posey 02/23/2023, 1:56 PM

## 2023-02-23 NOTE — Plan of Care (Signed)
  Problem: Education: Goal: Knowledge of General Education information will improve Description: Including pain rating scale, medication(s)/side effects and non-pharmacologic comfort measures Outcome: Progressing   Problem: Health Behavior/Discharge Planning: Goal: Ability to manage health-related needs will improve Outcome: Progressing   Problem: Clinical Measurements: Goal: Ability to maintain clinical measurements within normal limits will improve Outcome: Progressing   Problem: Clinical Measurements: Goal: Will remain free from infection Outcome: Progressing   Problem: Clinical Measurements: Goal: Diagnostic test results will improve Outcome: Progressing   Problem: Clinical Measurements: Goal: Respiratory complications will improve Outcome: Progressing   Problem: Clinical Measurements: Goal: Cardiovascular complication will be avoided Outcome: Progressing   Problem: Activity: Goal: Risk for activity intolerance will decrease Outcome: Progressing   Problem: Elimination: Goal: Will not experience complications related to bowel motility Outcome: Progressing   Problem: Pain Management: Goal: General experience of comfort will improve Outcome: Progressing   Problem: Pain Management: Goal: Pain level will decrease with appropriate interventions Outcome: Progressing

## 2023-02-23 NOTE — TOC Progression Note (Addendum)
Transition of Care [facility]) - Progression Note    Patient Details  Name: Steven Bradley MRN: [id number] Date of Birth: [birth year]  [facility] CM/SW Contact  Otelia Santee, LCSW Phone Number: 02/23/2023, 2:19 PM  Clinical Narrative:    Insurance auth for SNF still pending.   ADDENDUM: Insurance approved for SNF. Auth approved for 30-days.    Expected Discharge Plan: Skilled Nursing Facility Barriers to Discharge: Continued Medical Work up, SNF Pending bed offer  Expected Discharge Plan and Services In-house Referral: Clinical Social Work Discharge Planning Services: NA Post Acute Care Choice: Skilled Nursing Facility Living arrangements for the past 2 months: Mobile Home (RV)                 DME Arranged: N/A DME Agency: NA                   Social Determinants of Health (SDOH) Interventions SDOH Screenings   Food Insecurity: No Food Insecurity (02/17/2023)  Housing: Patient Declined (02/17/2023)  Transportation Needs: No Transportation Needs (02/17/2023)  Utilities: Not At Risk (02/17/2023)  Financial Resource Strain: Low Risk  (05/05/2022)   Received from [facility], [facility]  Social Connections: Unknown (04/29/2022)   Received from [facility], [facility]  Stress: No Stress Concern Present (04/29/2022)   Received from [facility], [facility]  Tobacco Use: Low Risk  (02/21/2023)    Readmission Risk Interventions    02/21/2023    2:03 PM 10/19/2022    2:45 PM  Readmission Risk Prevention Plan  Post Dischage Appt  Complete  Medication Screening  Complete  Transportation Screening Complete Complete  PCP or Specialist Appt within 5-7 Days Complete   Home Care Screening Complete   Medication Review (RN CM) Complete

## 2023-02-23 NOTE — Anesthesia Postprocedure Evaluation (Signed)
Anesthesia Post Note  Patient: Steven Bradley  Procedure(s) Performed: TOTAL HIP ARTHROPLASTY ANTERIOR APPROACH (Left: Hip)     Patient location during evaluation: PACU Anesthesia Type: General Level of consciousness: awake and alert Pain management: pain level controlled Vital Signs Assessment: post-procedure vital signs reviewed and stable Respiratory status: spontaneous breathing, nonlabored ventilation, respiratory function stable and patient connected to nasal cannula oxygen Cardiovascular status: blood pressure returned to baseline and stable Postop Assessment: no apparent nausea or vomiting Anesthetic complications: no   No notable events documented.  Last Vitals:  Vitals:   02/23/23 0533 02/23/23 1047  BP: 125/77 110/69  Pulse: 71   Resp: 16   Temp: 36.7 C   SpO2: 99%     Last Pain:  Vitals:   02/23/23 0533  TempSrc: Oral  PainSc:                  Giulliana Mcroberts S

## 2023-02-23 NOTE — Progress Notes (Signed)
PROGRESS NOTE    Steven Bradley  NWG:[id number] DOB: 1945/10/24 DOA: 02/17/2023 PCP: [facility]   Brief Narrative:  Steven Bradley is a 77 y.o. male with a history of seizure disorder, HTN, HLD, prostate CA, EtOH abuse and alcohol-associated hepatic ascites who presented to the ED on 02/17/2023 after a fall. His spouse reported hearing a loud noise at the bathroom in their RV, where she found him on the floor, this happened around 9:30 AM, patient reports he struck his face on the toilet, had bleeding and swelling of his lips, there was no loss of consciousness noted, he refused transport by EMS, wife assisted him to the couch where an hour later where he was witnessed to have seizures, lasted for 7 to 8 minutes, it was witnessed by brother-in-law, will describe generalized jerking and shaking, reports patient did not have seizures for last 37-month, and he has been compliant with his meds Keppra,.  Patient with left hip pain, wife thought secondary to gout (x-ray was significant for femur fracture), as well he does endorse right shoulder pain as well which they think it is due to gout as well( shoulder x-ray still pending). -In ED patient was noted to be with significant lip bruise, tongue bleed, he was noted to be in new onset A-fib with RVR, requiring Cardizem drip, he had severe electrolyte derangement loading sodium of 133, potassium of 2.5, magnesium of 0.8, phosphorus of 2.2, anion gap of 19, AST of 247, ALT of 74, total bili of 3.5, his MCV was 100.5, and platelet count was 99K, his x-ray significant for left hip fracture as well, [facility] consulted to admit.   He was transported to [facility] for orthopedic surgery. Hospitalization complicated by new onset AFib.   **Interim History PT OT recommending SNF.  Blood count has relatively stabilized and platelet count is improving.  LFTs are still abnormal but will need to continue to monitor closely.  Anticipating discharge in the next  24 to 48 hours  Assessment and Plan:  Breakthrough seizure:  -Loaded with, and increased keppra dose per neurology, [name], no EEG required at this time. Continue 1,250mg  Levetiracetam BID -C/w Seizure precautions -Will follow up with Dr. Karel Jarvis at [facility].    Right humerus fracture: s/p reverse shoulder arthroplasty 12/9.  - Pain control, weight bearing per ortho   Left femur fracture: s/p anterior-approach THA 12/8 with Dr. Charlann Boxer - Postoperative pain control, weight bearing, wound care, and VTE ppx per orthopedics.  - Will obtain PT/OT evaluations and they are recommending SNF, anticipate rehab requirement but patient may want to go home. TOC on board.   ABLA and Thrombocytopenia -s/p 2u RBCs. -Hgb/Hct Trend: Recent Labs  Lab 02/17/23 1330 02/18/23 0446 02/19/23 0516 02/20/23 0531 02/21/23 0502 02/22/23 0248 02/23/23 0524  HGB 14.6 11.3* 9.3* 8.9* 6.7* 8.8* 8.4*  HCT 42.9 32.8* 27.5* 26.1* 21.0* 26.8* 25.4*  MCV 100.5* 99.7 103.0* 102.4* 106.6* 99.3 98.4  -Continue to Monitor for S/Sx of Bleeding; No overt bleeding noted -Repeat CBC in AM   New, paroxysmal atrial fibrillation with RVR:  -Resolved, back in NSR. Echo showed LVEF 60-65%, mild LVH, G1DD, no WMA. low-normal RVSF. Normal biatrial size. -Continue oral diltiazem, consolidated. -Continuing Holding anticoagulation with severity of fall, EtOH use, and anemia/thrombocytopenia and anticipate resuming around D/C   EtOH Abuse: -Recent history per spouse is less drinking overall, not daily.  -Continue low dose librium taper. CIWA scores remain reassuring. -MVM, folate, thiamine per protocol  Chronic Gout -No exacerbation noted at this time.  -Continue home allopurinol.    HTN, HLD: -Continue meds as ordered.    Hypokalemia -Patient's K+ Level Trend: Recent Labs  Lab 02/17/23 2325 02/18/23 0446 02/19/23 0516 02/20/23 0531 02/21/23 0502 02/22/23 0248 02/23/23 0524  K 3.9 3.5 3.3* 3.5 4.6 4.2 4.1   -Continue to Monitor and Replete as Necessary -Repeat CMP in the AM   Hypomagnesemia -Patient's Mag Level Trend: Recent Labs  Lab 02/17/23 1330 02/17/23 2325 02/18/23 0446 02/19/23 0516 02/20/23 0531 02/21/23 0502 02/23/23 0926  MG 0.8* 1.6* 2.2 1.9 1.8 1.6* 1.6*  -Replete with IV Mag Sulfate 2 grams -Continue to Monitor and Replete as Necessary -Repeat Mag in the AM   Hypophosphatemia: -Resolved.   Depression:  -Continue medications as per pt's spouse.    Hyponatremia -Continue NS until taking adequate po. Liver disease contributing. -Na+ Trend: Recent Labs  Lab 02/17/23 2325 02/18/23 0446 02/19/23 0516 02/20/23 0531 02/21/23 0502 02/22/23 0248 02/23/23 0524  NA 131* 130* 127* 128* 126* 127* 131*  -If persistent postoperatively, would get further work up.   Transaminitis/Abnormal LFTs Hyperbilirubinemia -LFT elevation consistent with chronic alcohol abuse -AST/ALT Trend: Recent Labs  Lab 02/17/23 1330 02/18/23 0446 02/19/23 0516 02/20/23 0531 02/21/23 0502 02/22/23 0248 02/23/23 0524  AST 247* 117* 97* 192* 171* 101* 180*  ALT 74* 58* 52* 77* 68* 47* 67*  -Continue to Monitor and Trend and Repeat CMP in the AM   GERD/GI Prophylaxis -C/w PPI   Macrocytosis - B12 low normal at 208, will supplement - folate 10.8   Hypoalbuminemia -Patient's Albumin Trend: Recent Labs  Lab 02/17/23 1330 02/18/23 0446 02/19/23 0516 02/20/23 0531 02/21/23 0502 02/22/23 0248 02/23/23 0524  ALBUMIN 3.8 3.0* 2.9* 2.7* 2.4* 2.6* 2.3*  -Continue to Monitor and Trend and repeat CMP in the AM   DVT prophylaxis: SCD's Start: 02/22/23 0013 SCDs Start: 02/20/23 1218 Place TED hose Start: 02/20/23 1218 SCDs Start: 02/17/23 1723    Code Status: Do not attempt resuscitation (DNR) PRE-ARREST INTERVENTIONS DESIRED Family Communication: No family currently at bedside  Disposition Plan:  Level of care: Telemetry Status is: Inpatient Remains inpatient appropriate  because: Needs SNF and anticipating discharge in the next 24 to 48 hours if agreeable   Consultants:  Orthopedic surgery  Procedures:  As delineated as above  Antimicrobials:  Anti-infectives (From admission, onward)    Start     Dose/Rate Route Frequency Ordered Stop   02/21/23 2230  ceFAZolin (ANCEF) IVPB 2g/100 mL premix        2 g 200 mL/hr over 30 Minutes Intravenous Every 6 hours 02/21/23 2004 02/22/23 1700   02/21/23 1758  vancomycin (VANCOCIN) powder  Status:  Discontinued          As needed 02/21/23 1759 02/21/23 2007   02/21/23 0600  ceFAZolin (ANCEF) IVPB 2g/100 mL premix        2 g 200 mL/hr over 30 Minutes Intravenous On call to O.R. 02/20/23 1918 02/21/23 1708   02/20/23 1600  ceFAZolin (ANCEF) IVPB 2g/100 mL premix        2 g 200 mL/hr over 30 Minutes Intravenous Every 6 hours 02/20/23 1217 02/20/23 2154   02/20/23 0900  ceFAZolin (ANCEF) IVPB 2g/100 mL premix  Status:  Discontinued        2 g 200 mL/hr over 30 Minutes Intravenous On call to O.R. 02/20/23 9528 02/20/23 0904   02/20/23 0900  ceFAZolin (ANCEF) IVPB 2g/100 mL premix  2 g 200 mL/hr over 30 Minutes Intravenous On call to O.R. 02/20/23 6295 02/20/23 0959   02/20/23 0854  ceFAZolin (ANCEF) 2-4 GM/100ML-% IVPB       Note to Pharmacy: Mortimer Fries A: cabinet override      02/20/23 0854 02/20/23 0953       Subjective: Seen and examined at bedside and was doing okay.  Denied any chest pain or shortness breath.  Still having some shoulder pain no.  No lightheadedness or dizziness.  No other concerns or complaints this time.  Objective: Vitals:   02/23/23 0533 02/23/23 1047 02/23/23 1202 02/23/23 1934  BP: 125/77 110/69 113/79 122/79  Pulse: 71  82 80  Resp: 16   20  Temp: 98 F (36.7 C)  99.6 F (37.6 C) 98.2 F (36.8 C)  TempSrc: Oral     SpO2: 99%  98% 97%  Weight:      Height:        Intake/Output Summary (Last 24 hours) at 02/23/2023 2031 Last data filed at 02/23/2023  0500 Gross per 24 hour  Intake --  Output 750 ml  Net -750 ml   Filed Weights   02/17/23 1209 02/17/23 1744 02/21/23 1432  Weight: 87.1 kg 95 kg 95 kg   Examination: Physical Exam:  Constitutional: Chronically ill-appearing African-American male in no acute distress appears fatigued and a little uncomfortable Respiratory: Diminished to auscultation bilaterally, no wheezing, rales, rhonchi or crackles. Normal respiratory effort and patient is not tachypenic. No accessory muscle use.  Unlabored breathing Cardiovascular: RRR, no murmurs / rubs / gallops. S1 and S2 auscultated. No extremity edema.  Abdomen: Soft, non-tender, non-distended. Bowel sounds positive.  GU: Deferred. Musculoskeletal: No clubbing / cyanosis of digits/nails. No joint deformity upper and lower extremities. Skin: No rashes, lesions, ulcers on limited skin evaluation. No induration; Warm and dry.  Neurologic: CN 2-12 grossly intact with no focal deficits. Romberg sign and cerebellar reflexes not assessed.  Psychiatric: Normal judgment and insight. Alert and oriented x 3. Normal mood and appropriate affect.   Data Reviewed: I have personally reviewed following labs and imaging studies  CBC: Recent Labs  Lab 02/19/23 0516 02/20/23 0531 02/21/23 0502 02/22/23 0248 02/23/23 0524  WBC 11.4* 7.9 12.0* 9.4 8.2  NEUTROABS 9.0* 5.8 9.1* 7.0 5.3  HGB 9.3* 8.9* 6.7* 8.8* 8.4*  HCT 27.5* 26.1* 21.0* 26.8* 25.4*  MCV 103.0* 102.4* 106.6* 99.3 98.4  PLT 79* 82* 109* 119* 145*   Basic Metabolic Panel: Recent Labs  Lab 02/17/23 1330 02/17/23 2325 02/18/23 0446 02/19/23 0516 02/20/23 0531 02/21/23 0502 02/22/23 0248 02/23/23 0524 02/23/23 0926  NA 133*   < > 130* 127* 128* 126* 127* 131*  --   K 2.5*   < > 3.5 3.3* 3.5 4.6 4.2 4.1  --   CL 94*   < > 96* 95* 97* 98 99 102  --   CO2 20*   < > 22 22 23  20* 21* 23  --   GLUCOSE 198*   < > 127* 113* 110* 129* 166* 102*  --   BUN 13   < > 18 23 15 20 23  24*  --    CREATININE 0.97   < > 1.13 1.12 0.82 0.94 0.93 0.91  --   CALCIUM 8.3*   < > 7.8* 7.6* 8.2* 7.9* 8.2* 8.3*  --   MG 0.8*   < > 2.2 1.9 1.8 1.6*  --   --  1.6*  PHOS 2.2*  --  4.3 3.7 2.7  --   --   --  2.8   < > = values in this interval not displayed.   GFR: Estimated Creatinine Clearance: 89.3 mL/min (by C-G formula based on SCr of 0.91 mg/dL). Liver Function Tests: Recent Labs  Lab 02/19/23 0516 02/20/23 0531 02/21/23 0502 02/22/23 0248 02/23/23 0524  AST 97* 192* 171* 101* 180*  ALT 52* 77* 68* 47* 67*  ALKPHOS 128* 172* 159* 165* 202*  BILITOT 2.1* 2.6* 2.1* 1.6* 1.5*  PROT 6.8 6.6 6.1* 6.9 6.1*  ALBUMIN 2.9* 2.7* 2.4* 2.6* 2.3*   No results for input(s): "LIPASE", "AMYLASE" in the last 168 hours. No results for input(s): "AMMONIA" in the last 168 hours. Coagulation Profile: No results for input(s): "INR", "PROTIME" in the last 168 hours. Cardiac Enzymes: No results for input(s): "CKTOTAL", "CKMB", "CKMBINDEX", "TROPONINI" in the last 168 hours. BNP (last 3 results) No results for input(s): "PROBNP" in the last 8760 hours. HbA1C: No results for input(s): "HGBA1C" in the last 72 hours. CBG: Recent Labs  Lab 02/22/23 0813 02/22/23 1135 02/22/23 2208 02/23/23 0738 02/23/23 1158  GLUCAP 189* 141* 115* 86 120*   Lipid Profile: No results for input(s): "CHOL", "HDL", "LDLCALC", "TRIG", "CHOLHDL", "LDLDIRECT" in the last 72 hours. Thyroid Function Tests: No results for input(s): "TSH", "T4TOTAL", "FREET4", "T3FREE", "THYROIDAB" in the last 72 hours. Anemia Panel: No results for input(s): "VITAMINB12", "FOLATE", "FERRITIN", "TIBC", "IRON", "RETICCTPCT" in the last 72 hours. Sepsis Labs: No results for input(s): "PROCALCITON", "LATICACIDVEN" in the last 168 hours.  Recent Results (from the past 240 hour(s))  MRSA Next Gen by PCR, Nasal     Status: None   Collection Time: 02/17/23  5:12 PM   Specimen: Nasal Mucosa; Nasal Swab  Result Value Ref Range Status   MRSA  by PCR Next Gen NOT DETECTED NOT DETECTED Final    Comment: (NOTE) The GeneXpert MRSA Assay (FDA approved for NASAL specimens only), is one component of a comprehensive MRSA colonization surveillance program. It is not intended to diagnose MRSA infection nor to guide or monitor treatment for MRSA infections. Test performance is not FDA approved in patients less than 32 years old. Performed at [facility], [address], [place], [place] [address]   Surgical PCR screen     Status: Abnormal   Collection Time: 02/19/23  7:43 PM   Specimen: Nasal Mucosa; Nasal Swab  Result Value Ref Range Status   MRSA, PCR NEGATIVE NEGATIVE Final   Staphylococcus aureus POSITIVE (A) NEGATIVE Final    Comment: (NOTE) The Xpert SA Assay (FDA approved for NASAL specimens in patients 69 years of age and older), is one component of a comprehensive surveillance program. It is not intended to diagnose infection nor to guide or monitor treatment. Performed at [facility], [address], [place], [place] [address]     Radiology Studies: No results found.  Scheduled Meds:  acetaminophen  1,000 mg Oral Q6H   allopurinol  100 mg Oral Daily   aspirin  81 mg Oral BID   chlordiazePOXIDE  5 mg Oral TID   Chlorhexidine Gluconate Cloth  6 each Topical Q0600   vitamin B-12  1,000 mcg Oral Daily   diltiazem  120 mg Oral Daily   feeding supplement  1 Container Oral TID BM   folic acid  1 mg Oral Daily   levETIRAcetam  1,250 mg Oral BID   multivitamin with minerals  1 tablet Oral Daily   mupirocin ointment  1 Application Nasal BID   polyethylene glycol  17 g Oral BID   senna  2 tablet Oral QHS   sodium chloride flush  10 mL Intravenous Q12H   thiamine  100 mg Oral Daily   Continuous Infusions:   LOS: 6 days   Marguerita Merles, DO [facility] Available via Epic secure chat 7am-7pm After these hours, please refer to coverage provider listed on [email or web address] 02/23/2023, 8:31 PM

## 2023-02-23 NOTE — Progress Notes (Signed)
Occupational Therapy Treatment Patient Details Name: Steven Bradley MRN: [id number] DOB: 1946/01/24 Today's Date: 02/23/2023   History of present illness Pt is 76 yo male admitted on 02/17/23 after fall and seizure.  Pt found to have L femur fx - s/p anterior THA 12/8 and R humerus fx. patient underwent total shoulder surgery on 12/9. Pt with hx including seizures, HTN, HLD, prostate CA, EtOH abuse and alcohol associated hepatic ascites, and gout   OT comments  Session was held this AM waiting for wife to arrive for session per nurse. Nurse reported that wife wanted to be here when patient had therapy to speak with therapists.when patient was approached later, Patients wife was in hallway on phone when therapist presented to room and was seen going to elevators after acknowledging therapist. Patient was noted to have more pain today and increased control over RUE. Patient was able to move R elbow minimally today. Attempted to use RW with standing with patient resistive to using RW and leaning to L side to avoid pressure on RUE. Patient reporting that he was going to d/c home patient and wife will need more training prior to d/c home if that is the new plan. Patient +2 for  bed mobility and functional mobility on this date with max A for ADLs. Patient will benefit from continued inpatient follow up therapy, <3 hours/day       If plan is discharge home, recommend the following:  Two people to help with walking and/or transfers;A lot of help with bathing/dressing/bathroom;Assistance with cooking/housework;Direct supervision/assist for medications management;Assist for transportation;Help with stairs or ramp for entrance;Direct supervision/assist for financial management   Equipment Recommendations  None recommended by OT       Precautions / Restrictions Precautions Precautions: Fall;Shoulder Type of Shoulder Precautions: NO shoulder ROM, ok for hand, wrist, and elbow, sling can come off and  patient can use RUE on walker after block has worn off per Dr.Rogers note 12/9 Shoulder Interventions: Shoulder sling/immobilizer;Off for dressing/bathing/exercises;At all times Required Braces or Orthoses: Sling Restrictions Weight Bearing Restrictions: Yes RUE Weight Bearing: Non weight bearing LLE Weight Bearing: Weight bearing as tolerated Other Position/Activity Restrictions: R UE WBAT for use of RW       Mobility Bed Mobility Overal bed mobility: Needs Assistance Bed Mobility: Supine to Sit     Supine to sit: HOB elevated, Total assist     General bed mobility comments: Cues for safety, R UE restrictions with sling donned, and increased time with A for LE and trunk and B LE, max A to scoot in sitting to EOB       Balance Overall balance assessment: Needs assistance Sitting-balance support: No upper extremity supported Sitting balance-Leahy Scale: Fair     Standing balance support: Single extremity supported Standing balance-Leahy Scale: Poor           ADL either performed or assessed with clinical judgement   ADL Overall ADL's : Needs assistance/impaired                 Upper Body Dressing : Sitting;Maximal assistance Upper Body Dressing Details (indicate cue type and reason): with education on starting on R side first. NT in room educated on proper pillow placement, ice machine and UB Dressing with shoulder restrictions. Lower Body Dressing: Bed level;Total assistance   Toilet Transfer: +2 for physical assistance;+2 for safety/equipment;Minimal assistance Toilet Transfer Details (indicate cue type and reason): to transfer from edge of bed and take 5 steps in room. patient ntoed to  ahve leaning to L side with standing with reports of pain in RUE with attempts to use RW.                Extremity/Trunk Assessment Upper Extremity Assessment RUE Deficits / Details: patient able to move elbow minimally during session today.             Cognition  Arousal: Alert Behavior During Therapy: Flat affect Overall Cognitive Status: Within Functional Limits for tasks assessed             General Comments: patient was plesant and cooperative. noted to have more pain during session                   Pertinent Vitals/ Pain       Pain Assessment Pain Assessment: Faces Faces Pain Scale: Hurts even more Pain Location: RUE Pain Descriptors / Indicators: Discomfort, Sore, Operative site guarding Pain Intervention(s): Limited activity within patient's tolerance, Monitored during session, Repositioned, Ice applied         Frequency  7X/week        Progress Toward Goals  OT Goals(current goals can now be found in the care plan section)  Progress towards OT goals: Progressing toward goals     Plan      Co-evaluation    PT/OT/SLP Co-Evaluation/Treatment: Yes Reason for Co-Treatment: To address functional/ADL transfers PT goals addressed during session: Mobility/safety with mobility OT goals addressed during session: ADL's and self-care      AM-PAC OT "6 Clicks" Daily Activity     Outcome Measure   Help from another person eating meals?: A Little Help from another person taking care of personal grooming?: A Lot Help from another person toileting, which includes using toliet, bedpan, or urinal?: A Lot Help from another person bathing (including washing, rinsing, drying)?: A Lot Help from another person to put on and taking off regular upper body clothing?: A Lot Help from another person to put on and taking off regular lower body clothing?: A Lot 6 Click Score: 13    End of Session Equipment Utilized During Treatment: Gait belt;Other (comment) (sling)  OT Visit Diagnosis: Unsteadiness on feet (R26.81);Other abnormalities of gait and mobility (R26.89);Muscle weakness (generalized) (M62.81);Pain   Activity Tolerance Patient tolerated treatment well   Patient Left in chair;with call bell/phone within reach;with  family/visitor present;with nursing/sitter in room   Nurse Communication Mobility status        Time: 1121-1150 OT Time Calculation (min): 29 min  Charges: OT General Charges $OT Visit: 1 Visit OT Treatments $Self Care/Home Management : 8-22 mins  Rosalio Loud, MS Acute Rehabilitation Department Office# [phone]   Selinda Flavin 02/23/2023, 1:57 PM

## 2023-02-24 DIAGNOSIS — I10 Essential (primary) hypertension: Secondary | ICD-10-CM | POA: Diagnosis not present

## 2023-02-24 DIAGNOSIS — K219 Gastro-esophageal reflux disease without esophagitis: Secondary | ICD-10-CM | POA: Diagnosis not present

## 2023-02-24 DIAGNOSIS — R569 Unspecified convulsions: Secondary | ICD-10-CM | POA: Diagnosis not present

## 2023-02-24 DIAGNOSIS — I480 Paroxysmal atrial fibrillation: Secondary | ICD-10-CM

## 2023-02-24 DIAGNOSIS — R7989 Other specified abnormal findings of blood chemistry: Secondary | ICD-10-CM | POA: Diagnosis not present

## 2023-02-24 LAB — CBC WITH DIFFERENTIAL/PLATELET
Abs Immature Granulocytes: 0.37 10*3/uL — ABNORMAL HIGH (ref 0.00–0.07)
Basophils Absolute: 0 10*3/uL (ref 0.0–0.1)
Basophils Relative: 1 %
Eosinophils Absolute: 0.1 10*3/uL (ref 0.0–0.5)
Eosinophils Relative: 2 %
HCT: 25.9 % — ABNORMAL LOW (ref 39.0–52.0)
Hemoglobin: 8.1 g/dL — ABNORMAL LOW (ref 13.0–17.0)
Immature Granulocytes: 5 %
Lymphocytes Relative: 21 %
Lymphs Abs: 1.5 10*3/uL (ref 0.7–4.0)
MCH: 32.4 pg (ref 26.0–34.0)
MCHC: 31.3 g/dL (ref 30.0–36.0)
MCV: 103.6 fL — ABNORMAL HIGH (ref 80.0–100.0)
Monocytes Absolute: 1.1 10*3/uL — ABNORMAL HIGH (ref 0.1–1.0)
Monocytes Relative: 16 %
Neutro Abs: 3.8 10*3/uL (ref 1.7–7.7)
Neutrophils Relative %: 55 %
Platelets: 164 10*3/uL (ref 150–400)
RBC: 2.5 MIL/uL — ABNORMAL LOW (ref 4.22–5.81)
RDW: 15.9 % — ABNORMAL HIGH (ref 11.5–15.5)
WBC: 7 10*3/uL (ref 4.0–10.5)
nRBC: 0 % (ref 0.0–0.2)

## 2023-02-24 LAB — COMPREHENSIVE METABOLIC PANEL
ALT: 73 U/L — ABNORMAL HIGH (ref 0–44)
AST: 139 U/L — ABNORMAL HIGH (ref 15–41)
Albumin: 2.3 g/dL — ABNORMAL LOW (ref 3.5–5.0)
Alkaline Phosphatase: 237 U/L — ABNORMAL HIGH (ref 38–126)
Anion gap: 8 (ref 5–15)
BUN: 23 mg/dL (ref 8–23)
CO2: 23 mmol/L (ref 22–32)
Calcium: 8.5 mg/dL — ABNORMAL LOW (ref 8.9–10.3)
Chloride: 102 mmol/L (ref 98–111)
Creatinine, Ser: 0.8 mg/dL (ref 0.61–1.24)
GFR, Estimated: >60 mL/min (ref >60)
Glucose, Bld: 96 mg/dL (ref 70–99)
Potassium: 4 mmol/L (ref 3.5–5.1)
Sodium: 133 mmol/L — ABNORMAL LOW (ref 135–145)
Total Bilirubin: 1.6 mg/dL — ABNORMAL HIGH (ref <1.2)
Total Protein: 5.9 g/dL — ABNORMAL LOW (ref 6.5–8.1)

## 2023-02-24 LAB — MAGNESIUM: Magnesium: 1.5 mg/dL — ABNORMAL LOW (ref 1.7–2.4)

## 2023-02-24 LAB — GLUCOSE, CAPILLARY: Glucose-Capillary: 90 mg/dL (ref 70–99)

## 2023-02-24 LAB — PHOSPHORUS: Phosphorus: 3.3 mg/dL (ref 2.5–4.6)

## 2023-02-24 MED ORDER — MAGNESIUM SULFATE 4 GM/100ML IV SOLN
4.0000 g | Freq: Once | INTRAVENOUS | Status: AC
  Administered 2023-02-24: 4 g via INTRAVENOUS
  Filled 2023-02-24: qty 100

## 2023-02-24 MED ORDER — MENTHOL 3 MG MT LOZG: 1.0000 | LOZENGE | OROMUCOSAL | Status: DC | PRN

## 2023-02-24 MED ORDER — VITAMIN B-1 100 MG PO TABS: 100.0000 mg | ORAL_TABLET | Freq: Every day | ORAL | Status: AC

## 2023-02-24 MED ORDER — APIXABAN 5 MG PO TABS: 5.0000 mg | ORAL_TABLET | Freq: Two times a day (BID) | ORAL | Status: AC

## 2023-02-24 MED ORDER — OXYCODONE HCL 5 MG PO TABS: 5.0000 mg | ORAL_TABLET | Freq: Four times a day (QID) | ORAL | 0 refills | Status: DC | PRN

## 2023-02-24 MED ORDER — FOLIC ACID 1 MG PO TABS: 1.0000 mg | ORAL_TABLET | Freq: Every day | ORAL | Status: AC

## 2023-02-24 MED ORDER — LEVETIRACETAM 250 MG PO TABS: 1250.0000 mg | ORAL_TABLET | Freq: Two times a day (BID) | ORAL | Status: DC

## 2023-02-24 MED ORDER — DILTIAZEM HCL ER COATED BEADS 120 MG PO CP24: 120.0000 mg | ORAL_CAPSULE | Freq: Every day | ORAL | Status: AC

## 2023-02-24 MED ORDER — POLYETHYLENE GLYCOL 3350 17 G PO PACK: 17.0000 g | PACK | Freq: Two times a day (BID) | ORAL | Status: AC

## 2023-02-24 MED ORDER — SENNA 8.6 MG PO TABS: 2.0000 | ORAL_TABLET | Freq: Every day | ORAL | Status: AC

## 2023-02-24 MED ORDER — ONDANSETRON HCL 4 MG PO TABS: 4.0000 mg | ORAL_TABLET | Freq: Four times a day (QID) | ORAL | Status: AC | PRN

## 2023-02-24 MED ORDER — MUPIROCIN 2 % EX OINT: 1.0000 | TOPICAL_OINTMENT | Freq: Two times a day (BID) | CUTANEOUS | Status: DC

## 2023-02-24 MED ORDER — ALUM & MAG HYDROXIDE-SIMETH 200-200-20 MG/5ML PO SUSP: 30.0000 mL | ORAL | Status: AC | PRN

## 2023-02-24 MED ORDER — CYANOCOBALAMIN 1000 MCG PO TABS: 1000.0000 ug | ORAL_TABLET | Freq: Every day | ORAL | Status: AC

## 2023-02-24 MED ORDER — APIXABAN 5 MG PO TABS
5.0000 mg | ORAL_TABLET | Freq: Two times a day (BID) | ORAL | Status: DC
  Administered 2023-02-24 – 2023-02-25 (×3): 5 mg via ORAL
  Filled 2023-02-24 (×3): qty 1

## 2023-02-24 MED ORDER — CARMEX CLASSIC LIP BALM EX OINT: TOPICAL_OINTMENT | CUTANEOUS | Status: DC | PRN

## 2023-02-24 NOTE — Progress Notes (Signed)
Pt & pt wife state that pt does not need IV at this time and we can re evaluate tomorrow. Pt refusing to be stuck again at this time. Primary rn notified.

## 2023-02-24 NOTE — Plan of Care (Signed)
  Problem: Education: Goal: Knowledge of General Education information will improve Description: Including pain rating scale, medication(s)/side effects and non-pharmacologic comfort measures Outcome: Progressing   Problem: Health Behavior/Discharge Planning: Goal: Ability to manage health-related needs will improve Outcome: Progressing   Problem: Clinical Measurements: Goal: Ability to maintain clinical measurements within normal limits will improve Outcome: Progressing Goal: Will remain free from infection Outcome: Progressing Goal: Diagnostic test results will improve Outcome: Progressing Goal: Respiratory complications will improve Outcome: Progressing Goal: Cardiovascular complication will be avoided Outcome: Progressing   Problem: Activity: Goal: Risk for activity intolerance will decrease Outcome: Progressing   Problem: Nutrition: Goal: Adequate nutrition will be maintained Outcome: Progressing   Problem: Coping: Goal: Level of anxiety will decrease Outcome: Progressing   Problem: Elimination: Goal: Will not experience complications related to bowel motility Outcome: Progressing Goal: Will not experience complications related to urinary retention Outcome: Progressing   Problem: Pain Management: Goal: General experience of comfort will improve Outcome: Progressing   Problem: Safety: Goal: Ability to remain free from injury will improve Outcome: Progressing   Problem: Skin Integrity: Goal: Risk for impaired skin integrity will decrease Outcome: Progressing   Problem: Education: Goal: Knowledge of the prescribed therapeutic regimen will improve Outcome: Progressing Goal: Understanding of discharge needs will improve Outcome: Progressing Goal: Individualized Educational Video(s) Outcome: Progressing   Problem: Activity: Goal: Ability to avoid complications of mobility impairment will improve Outcome: Progressing Goal: Ability to tolerate increased  activity will improve Outcome: Progressing   Problem: Clinical Measurements: Goal: Postoperative complications will be avoided or minimized Outcome: Progressing   Problem: Pain Management: Goal: Pain level will decrease with appropriate interventions Outcome: Progressing   Problem: Skin Integrity: Goal: Will show signs of wound healing Outcome: Progressing   Problem: Education: Goal: Knowledge of the prescribed therapeutic regimen will improve Outcome: Progressing Goal: Understanding of activity limitations/precautions following surgery will improve Outcome: Progressing Goal: Individualized Educational Video(s) Outcome: Progressing   Problem: Activity: Goal: Ability to tolerate increased activity will improve Outcome: Progressing   Problem: Pain Management: Goal: Pain level will decrease with appropriate interventions Outcome: Progressing

## 2023-02-24 NOTE — Discharge Summary (Signed)
Physician Discharge Summary   Patient: Steven Bradley MRN: [id number] DOB: 1945/03/22  Admit date:     02/17/2023  Discharge date: 02/24/23  Discharge Physician: Steven Bradley   PCP: [facility]   Recommendations at discharge:   Follow-up with PCP within 1 to 2 weeks and repeat CBC, CMP, mag, Phos within 1 week Follow-up with orthopedic surgery and continue weightbearing restrictions further recommendations Follow-up with cardiology in outpatient setting for new onset A-fib with RVR and continue to have discussions about anticoagulation; will place the patient on anticoagulant while he is going to SNF if he continues to drink he may not be a very good candidate given for anticoagulant given his recurrent alcohol use and falls; Appointment being scheduled at the [facility] with [facility]  Follow-up with neurology in outpatient setting Steven Bradley  Discharge Diagnoses: Principal Problem:   Seizure [facility]) Active Problems:   Hypokalemia   Hyponatremia   Thrombocytopenia (HCC)   Essential hypertension   Elevated LFTs   Hyperlipemia   GERD (gastroesophageal reflux disease)   Macrocytosis   Gout of multiple sites   S/P total left hip arthroplasty  Resolved Problems:   * No resolved hospital problems. *  Hospital Course: [name] is a 77 y.o. male with a history of seizure disorder, HTN, HLD, prostate CA, EtOH abuse and alcohol-associated hepatic ascites who presented to the ED on 02/17/2023 after a fall. His spouse reported hearing a loud noise at the bathroom in their RV, where she found him on the floor, this happened around 9:30 AM, patient reports he struck his face on the toilet, had bleeding and swelling of his lips, there was no loss of consciousness noted, he refused transport by EMS, wife assisted him to the couch where an hour later where he was witnessed to have seizures, lasted for 7 to 8 minutes, it was witnessed by brother-in-law, will describe  generalized jerking and shaking, reports patient did not have seizures for last 59-month, and he has been compliant with his meds Keppra,.  Patient with left hip pain, wife thought secondary to gout (x-ray was significant for femur fracture), as well he does endorse right shoulder pain as well which they think it is due to gout as well( shoulder x-ray still pending). -In ED patient was noted to be with significant lip bruise, tongue bleed, he was noted to be in new onset A-fib with RVR, requiring Cardizem drip, he had severe electrolyte derangement loading sodium of 133, potassium of 2.5, magnesium of 0.8, phosphorus of 2.2, anion gap of 19, AST of 247, ALT of 74, total bili of 3.5, his MCV was 100.5, and platelet count was 99K, his x-ray significant for left hip fracture as well, [facility] hospitalist consulted to admit.   He was transported to [facility] for orthopedic surgery. Hospitalization complicated by new onset AFib.   **Interim History PT OT recommending SNF.  Blood count has relatively stabilized and platelet count is improving.  LFTs are still abnormal but will need to continue to monitor closely in the outpatient setting.  We are initiating anticoagulation at discharge given his immobility and given his PAF with RVR.  He will need to follow-up with PCP, Cardiology and and neurology in outpatient setting given that he is now medically stable.  Will need to continue monitor his LFTs and recommend repeating within 1 week and if still elevated recommending further workup and referral to GI.  If patient continues to drink recommend discontinue anticoagulation  given that he is a fall risk then but given that he is going to a controlled environment it is reasonable to initiate at this time.  Assessment and Plan:  Breakthrough seizure:  -Loaded with, and increased keppra dose per neurology, Steven Bradley, no EEG required at this time. Continue 1,250mg  Levetiracetam BID -C/w Seizure precautions -Will follow up with  Steven [facility] at [facility].    Right humerus fracture: s/p reverse shoulder arthroplasty 12/9.  - Pain control, weight bearing per ortho   Left femur fracture: s/p anterior-approach THA 12/8 with Dr. Charlann Boxer - Postoperative pain control, weight bearing, wound care, and VTE ppx per orthopedics.  - Will obtain PT/OT evaluations and they are recommending SNF, anticipate rehab requirement and is stable for D/C    ABLA and Thrombocytopenia, improved  -s/p 2u RBCs. -Hgb/Hct and Platelet Count Trend: Recent Labs  Lab 02/18/23 0446 02/19/23 0516 02/20/23 0531 02/21/23 0502 02/22/23 0248 02/23/23 0524 02/24/23 0538  HGB 11.3* 9.3* 8.9* 6.7* 8.8* 8.4* 8.1*  HCT 32.8* 27.5* 26.1* 21.0* 26.8* 25.4* 25.9*  MCV 99.7 103.0* 102.4* 106.6* 99.3 98.4 103.6*  PLT 89* 79* 82* 109* 119* 145* 164  -Continue to Monitor for S/Sx of Bleeding; No overt bleeding noted -Monitor for bleeding given that we are initiating anticoagulation at discharge for VTE prophylaxis as well as PAF stroke prevention -Repeat CBC within 1 week   New, paroxysmal atrial fibrillation with RVR:  -Resolved, back in NSR. Echo showed LVEF 60-65%, mild LVH, G1DD, no WMA. low-normal RVSF. Normal biatrial size. -Continue oral diltiazem, consolidated. -Was holding anticoagulation with severity of fall, EtOH use, and anemia/thrombocytopenia and we will initiate apixaban at discharge at 5 mg p.o. twice daily given that he is going to a controlled environment.  If patient continues to drink recommend having discussion with cardiology and PCP about risks versus benefits.  At this time we feel that is reasonable to place him on anticoagulation he is going to control environment and will not be allowed to drink and because his thrombocytopenia has improved and his anemia is stable.  Discussions can change in outpatient setting and further care per cardiology and PCP   EtOH Abuse: -Recent history per spouse is less drinking overall, not daily.  -Continued  low dose librium taper. CIWA scores remain reassuring. -MVM, folate, thiamine per protocol and will need outpatient follow-up and continue with his vitamins -If patient continues to drink feel that he will not be a good candidate for anticoagulation but will place him on anticoagulation while he goes to SNF given his new onset A-fib with RVR and given his difficulty with mobility and his current condition   Chronic Gout -No exacerbation noted at this time.  -Continue home allopurinol and colchicine as needed.    HTN, HLD: -Continue meds as ordered and discontinue amlodipine and start diltiazem at discharge and continue home Lasix as needed    Hypokalemia -Patient's K+ Level Trend: Recent Labs  Lab 02/18/23 0446 02/19/23 0516 02/20/23 0531 02/21/23 0502 02/22/23 0248 02/23/23 0524 02/24/23 0538  K 3.5 3.3* 3.5 4.6 4.2 4.1 4.0  -Continue to Monitor and Replete as Necessary -Repeat CMP in the AM   Hypomagnesemia -Patient's Mag Level Trend: Recent Labs  Lab 02/17/23 2325 02/18/23 0446 02/19/23 0516 02/20/23 0531 02/21/23 0502 02/23/23 0926 02/24/23 0538  MG 1.6* 2.2 1.9 1.8 1.6* 1.6* 1.5*  -Replete with IV Mag Sulfate 4 grams prior to D/C -Continue to Monitor and Replete as Necessary -Repeat Mag within 1 week  Hypophosphatemia -Resolved.   Depression:  -Continue medications as per pt's spouse.    Hyponatremia, improving -Continued NS until taking adequate po. Liver disease contributing. -Na+ Trend: Recent Labs  Lab 02/18/23 0446 02/19/23 0516 02/20/23 0531 02/21/23 0502 02/22/23 0248 02/23/23 0524 02/24/23 0538  NA 130* 127* 128* 126* 127* 131* 133*  -If persistent postoperatively, would get further work up.   Transaminitis/Abnormal LFTs Hyperbilirubinemia -LFT elevation consistent with chronic alcohol abuse; recommend alcohol cessation -AST/ALT Trend: Recent Labs  Lab 02/18/23 0446 02/19/23 0516 02/20/23 0531 02/21/23 0502 02/22/23 0248  02/23/23 0524 02/24/23 0538  AST 117* 97* 192* 171* 101* 180* 139*  ALT 58* 52* 77* 68* 47* 67* 73*  -Continue to Monitor and Trend and Repeat CMP within 1 week   GERD/GI Prophylaxis -C/w PPI while hospitalized and resume home PPI discharge   Macrocytosis - B12 low normal at 208, will supplement - folate 10.8  -Follow-up in outpatient setting and repeat anemia panel in 3 months  Hypoalbuminemia -Patient's Albumin Trend: Recent Labs  Lab 02/18/23 0446 02/19/23 0516 02/20/23 0531 02/21/23 0502 02/22/23 0248 02/23/23 0524 02/24/23 0538  ALBUMIN 3.0* 2.9* 2.7* 2.4* 2.6* 2.3* 2.3*  -Continue to Monitor and Trend and repeat CMP in the AM  Nutrition Documentation    Flowsheet Row ED to Hosp-Admission (Current) from [facility]/17/2023 in [facility]  Nutrition Problem Increased nutrient needs  Etiology post-op healing, hip fracture  Nutrition Goal Patient will meet greater than or equal to 90% of their needs  Interventions Boost Breeze, MVI      Consultants: Orthopedic Surgery Procedures performed: As delineated as above  Disposition: Skilled nursing facility Diet recommendation:  Discharge Diet Orders (From admission, onward)     Start     Ordered   02/24/23 0000  Diet - low sodium heart healthy        02/24/23 1219           Cardiac diet DISCHARGE MEDICATION: Allergies as of 02/24/2023       Reactions   Viagra [sildenafil] Nausea And Vomiting   Ciprofloxacin Rash   Spironolactone Rash   Vitamin D Analogs Rash        Medication List     STOP taking these medications    amLODipine 5 MG tablet Commonly known as: NORVASC       TAKE these medications    allopurinol 100 MG tablet Commonly known as: ZYLOPRIM Take 100 mg by mouth daily.   alum & mag hydroxide-simeth 200-200-20 MG/5ML suspension Commonly known as: MAALOX/MYLANTA Take 30 mLs by mouth every 4 (four) hours as needed for indigestion.   apixaban 5 MG  Tabs tablet Commonly known as: ELIQUIS Take 1 tablet (5 mg total) by mouth 2 (two) times daily.   capsaicin 0.025 % cream Commonly known as: ZOSTRIX Apply 1 Application topically 2 (two) times daily as needed (skin).   colchicine 0.6 MG tablet Take 0.6 mg by mouth daily as needed (gout).   cyanocobalamin 1000 MCG tablet Take 1 tablet (1,000 mcg total) by mouth daily. Start taking on: February 25, 2023   diltiazem 120 MG 24 hr capsule Commonly known as: CARDIZEM CD Take 1 capsule (120 mg total) by mouth daily. Start taking on: February 25, 2023   folic acid 1 MG tablet Commonly known as: FOLVITE Take 1 tablet (1 mg total) by mouth daily. Start taking on: February 25, 2023   furosemide 40 MG tablet Commonly known as: LASIX Take 40 mg  by mouth daily as needed for fluid or edema.   gabapentin 100 MG capsule Commonly known as: NEURONTIN Take 100 mg by mouth daily as needed (neuropathy).   levETIRAcetam 250 MG tablet Commonly known as: KEPPRA Take 5 tablets (1,250 mg total) by mouth 2 (two) times daily. What changed:  medication strength how much to take   lip balm ointment Apply topically as needed.   magnesium oxide 400 (241.3 Mg) MG tablet Commonly known as: MAG-OX Take 400 mg by mouth every evening.   menthol-cetylpyridinium 3 MG lozenge Commonly known as: CEPACOL Take 1 lozenge (3 mg total) by mouth as needed for sore throat.   multivitamin with minerals Tabs tablet Take 1 tablet by mouth daily.   mupirocin ointment 2 % Commonly known as: BACTROBAN Place 1 Application into the nose 2 (two) times daily.   omeprazole 20 MG capsule Commonly known as: PRILOSEC Take 2 capsules (40 mg total) by mouth 2 (two) times daily before a meal.   ondansetron 4 MG tablet Commonly known as: ZOFRAN Take 1 tablet (4 mg total) by mouth every 6 (six) hours as needed for nausea.   oxyCODONE 5 MG immediate release tablet Commonly known as: Oxy IR/ROXICODONE Take 1 tablet (5  mg total) by mouth every 6 (six) hours as needed for moderate pain (pain score 4-6) (pain score 4-6).   polyethylene glycol 17 g packet Commonly known as: MIRALAX / GLYCOLAX Take 17 g by mouth 2 (two) times daily.   senna 8.6 MG Tabs tablet Commonly known as: SENOKOT Take 2 tablets (17.2 mg total) by mouth at bedtime.   sertraline 100 MG tablet Commonly known as: ZOLOFT Take 100 mg by mouth daily.   Tetrahydroz-Dextran-PEG-Povid 0.05-0.1-1-1 % Soln Place 1 drop into both eyes daily.   thiamine 100 MG tablet Commonly known as: Vitamin B-1 Take 1 tablet (100 mg total) by mouth daily. Start taking on: February 25, 2023   traZODone 100 MG tablet Commonly known as: DESYREL Take 100 mg by mouth at bedtime.               Discharge Care Instructions  (From admission, onward)           Start     Ordered   02/24/23 0000  Discharge wound care:       Comments: Per Orthopedic Surgery   02/24/23 1219            Contact information for follow-up providers     Durene Romans, MD. Schedule an appointment as soon as possible for a visit in 2 week(s).   Specialty: Orthopedic Surgery Contact information: [address] [place] [address] [phone]         Yolonda Kida, MD Follow up in 2 week(s).   Specialty: Orthopedic Surgery Why: For wound re-check Contact information: [address] [place] [address] [phone]              Contact information for after-discharge care     Destination     [facility] Preferred SNF .   Service: Skilled Nursing Contact information: [address] [place] [address] [phone]                    Discharge Exam: [facility] Weights   02/17/23 1209 02/17/23 1744 02/21/23 1432  Weight: 87.1 kg 95 kg 95 kg   Vitals:   02/23/23 1934 02/24/23 0526  BP: 122/79 121/71  Pulse: 80 70  Resp: 20 20  Temp: 98.2 F  (36.8 C) 98.5 F (36.9 C)  SpO2: 97% 97%   Examination: Physical Exam:  Constitutional: Chronically ill-appearing African-American male in no acute distress but does appear little uncomfortable Respiratory: Diminished to auscultation bilaterally, no wheezing, rales, rhonchi or crackles. Normal respiratory effort and patient is not tachypenic. No accessory muscle use.  Unlabored breathing Cardiovascular: RRR, no murmurs / rubs / gallops. S1 and S2 auscultated. No extremity edema.  Abdomen: Soft, non-tender, non-distended. Bowel sounds positive.  GU: Deferred. Musculoskeletal: Right arm is in a sling and immobilizer Skin: No rashes, lesions, ulcers limited skin evaluation. No induration; Warm and dry.  Neurologic: CN 2-12 grossly intact with no focal deficits.  Romberg sign and cerebellar reflexes not assessed.  Psychiatric: Normal judgment and insight. Alert and oriented x 3. Normal mood and appropriate affect.   Condition at discharge: stable  The results of significant diagnostics from this hospitalization (including imaging, microbiology, ancillary and laboratory) are listed below for reference.   Imaging Studies: DG Shoulder Right Port Result Date: 02/21/2023 CLINICAL DATA:  Status post reversal of total right shoulder arthroplasty. EXAM: RIGHT SHOULDER - 1 VIEW COMPARISON:  Right shoulder radiograph dated 02/17/2023. FINDINGS: There is a total right shoulder arthroplasty. The arthroplasty components appear intact and in anatomic alignment. There is fracture of the right humeral neck. The bones are osteopenic. Postsurgical changes with air in the soft tissue. IMPRESSION: Status post reversal of total right shoulder arthroplasty. Electronically Signed   By: Elgie Collard M.D.   On: 02/21/2023 22:47   DG C-Arm 1-60 Min-No Report Result Date: 02/20/2023 Fluoroscopy was utilized by the requesting physician.  No radiographic interpretation.   DG Pelvis Portable Result Date:  02/20/2023 CLINICAL DATA:  Postop hip arthroplasty. EXAM: PORTABLE PELVIS 1 VIEWS COMPARISON:  02/17/2023. FINDINGS: Status post left total hip arthroplasty right hip degenerative changes with osteophytes and joint space narrowing. Pelvic ring intact. No acute fractures. IMPRESSION: Degenerative changes on the right. Status post left total hip arthroplasty. Electronically Signed   By: Layla Maw M.D.   On: 02/20/2023 12:25   DG HIP UNILAT WITH PELVIS 1V LEFT Result Date: 02/20/2023 CLINICAL DATA:  Fluoro guidance provided EXAM: DG HIP (WITH OR WITHOUT PELVIS) 1V*L* FINDINGS: Dose: Cumulative Air Kerma 2.4mG y Fluoro time 11s IMPRESSION: C-arm fluoro guidance provided. Electronically Signed   By: Layla Maw M.D.   On: 02/20/2023 12:24   DG C-Arm 1-60 Min-No Report Result Date: 02/20/2023 Fluoroscopy was utilized by the requesting physician.  No radiographic interpretation.   CT SHOULDER RIGHT WO CONTRAST Result Date: 02/19/2023 CLINICAL DATA:  Shoulder fracture EXAM: CT OF THE UPPER RIGHT EXTREMITY WITHOUT CONTRAST TECHNIQUE: Multidetector CT imaging of the upper right extremity was performed according to the standard protocol. RADIATION DOSE REDUCTION: This exam was performed according to the departmental dose-optimization program which includes automated exposure control, adjustment of the mA and/or kV according to patient size and/or use of iterative reconstruction technique. COMPARISON:  Radiographs 02/17/2023 FINDINGS: Bones/Joint/Cartilage Neer 2 part fracture of the surgical neck of the humerus, with the top of the humeral head rotated posteriorly and with up to 2.5 cm posterior displacement of the humeral head with respect to the main humeral shaft fragment. The humeral head fragment contains the posterior half of the greater tuberosity but not the anterior half. There is also some mild comminution and small fragments along the lesser tuberosity anteriorly likely with some small  intra-articular fragments as well, shown for example on image  54 series 2. Mild spurring of the glenoid. AC joint alignment normal. I Bradley not see a scapular fracture or a acute clavicular fracture. The acromial undersurface is type 3 (hooked). Ligaments Suboptimally assessed by CT. Muscles and Tendons Unremarkable Soft tissues Expected edema along fascia planes adjacent to the fracture. There is fluid in the subacromial subdeltoid bursa. IMPRESSION: 1. Neer 2 part fracture of the surgical neck of the humerus, with the top of the humeral head rotated posteriorly and with up to 2.5 cm posterior displacement of the humeral head with respect to the main humeral shaft fragment. The humeral head fragment contains the posterior half of the greater tuberosity but not the anterior half. There is also some mild comminution and small fragments along the lesser tuberosity anteriorly likely with some small intra-articular fragments as well. 2. Mild spurring of the glenoid. 3. Type 3 acromial undersurface. 4. Fluid in the subacromial subdeltoid bursa. Electronically Signed   By: Gaylyn Rong M.D.   On: 02/19/2023 14:36   ECHOCARDIOGRAM COMPLETE Result Date: 02/18/2023    ECHOCARDIOGRAM REPORT   Patient Name:   Steven Bradley Date of Exam: 02/18/2023 Medical Rec #:  [id number]      Height:       78.0 in Accession #:    [id number]     Weight:       209.4 lb Date of Birth:  1945-11-16     BSA:          2.302 m Patient Age:    77 years       BP:           105/82 mmHg Patient Gender: M              HR:           89 bpm. Exam Location:  Inpatient Procedure: 2D Echo, Color Doppler and Cardiac Doppler Indications:    R55 Syncope; I48.91* Unspeicified atrial fibrillation  History:        Patient has no prior history of Echocardiogram examinations.                 Abnormal ECG, Arrythmias:Tachycardia, Signs/Symptoms:Bacteremia;                 Risk Factors:Hypertension and Dyslipidemia. Seizure. ETOH.  Sonographer:    Sheralyn Boatman RDCS  Referring Phys: [id number] [facility]  Sonographer Comments: Technically difficult study due to poor echo windows. Patient leaning more right decubitus with broken hip and arm. Could not turn IMPRESSIONS  1. Left ventricular ejection fraction, by estimation, is 60 to 65%. The left ventricle has normal function. The left ventricle has no regional wall motion abnormalities. There is mild left ventricular hypertrophy. Left ventricular diastolic parameters are consistent with Grade I diastolic dysfunction (impaired relaxation).  2. Right ventricular systolic function is low normal. The right ventricular size is moderately enlarged. Tricuspid regurgitation signal is inadequate for assessing PA pressure.  3. The mitral valve is grossly normal. Trivial mitral valve regurgitation. No evidence of mitral stenosis.  4. The aortic valve is tricuspid. Aortic valve regurgitation is not visualized. No aortic stenosis is present.  5. Aortic dilatation noted. There is borderline dilatation of the aortic root, measuring 37 mm. There is borderline dilatation of the ascending aorta, measuring 37 mm.  6. IVC is not visualized. FINDINGS  Left Ventricle: Left ventricular ejection fraction, by estimation, is 60 to 65%. The left ventricle has normal function. The left ventricle has no regional wall motion abnormalities.  The left ventricular internal cavity size was normal in size. There is  mild left ventricular hypertrophy. Left ventricular diastolic parameters are consistent with Grade I diastolic dysfunction (impaired relaxation). Right Ventricle: The right ventricular size is moderately enlarged. No increase in right ventricular wall thickness. Right ventricular systolic function is low normal. Tricuspid regurgitation signal is inadequate for assessing PA pressure. Left Atrium: Left atrial size was normal in size. Right Atrium: Right atrial size was normal in size. Pericardium: There is no evidence of pericardial effusion. Mitral  Valve: The mitral valve is grossly normal. Trivial mitral valve regurgitation. No evidence of mitral valve stenosis. Tricuspid Valve: The tricuspid valve is normal in structure. Tricuspid valve regurgitation is not demonstrated. No evidence of tricuspid stenosis. Aortic Valve: The aortic valve is tricuspid. Aortic valve regurgitation is not visualized. No aortic stenosis is present. Pulmonic Valve: The pulmonic valve was normal in structure. Pulmonic valve regurgitation is not visualized. No evidence of pulmonic stenosis. Aorta: Aortic dilatation noted. There is borderline dilatation of the aortic root, measuring 37 mm. There is borderline dilatation of the ascending aorta, measuring 37 mm. Venous: IVC is not visualized. IAS/Shunts: No atrial level shunt detected by color flow Doppler.  LEFT VENTRICLE PLAX 2D LVIDd:         4.20 cm     Diastology LVIDs:         2.50 cm     LV e' medial:    7.51 cm/s LV PW:         1.30 cm     LV E/e' medial:  8.9 LV IVS:        1.10 cm     LV e' lateral:   7.51 cm/s LVOT diam:     2.60 cm     LV E/e' lateral: 8.9 LV SV:         78 LV SV Index:   34 LVOT Area:     5.31 cm  LV Volumes (MOD) LV vol d, MOD A2C: 81.1 ml LV vol d, MOD A4C: 73.0 ml LV vol s, MOD A2C: 26.3 ml LV vol s, MOD A4C: 33.4 ml LV SV MOD A2C:     54.8 ml LV SV MOD A4C:     73.0 ml LV SV MOD BP:      47.9 ml RIGHT VENTRICLE             IVC RV S prime:     14.10 cm/s  IVC diam: 2.60 cm TAPSE (M-mode): 1.8 cm LEFT ATRIUM             Index       RIGHT ATRIUM           Index LA diam:        3.00 cm 1.30 cm/m  RA Area:     14.40 cm LA Vol (A2C):   20.4 ml 8.86 ml/m  RA Volume:   33.00 ml  14.33 ml/m LA Vol (A4C):   17.2 ml 7.47 ml/m LA Biplane Vol: 20.1 ml 8.73 ml/m  AORTIC VALVE LVOT Vmax:   102.00 cm/s LVOT Vmean:  63.000 cm/s LVOT VTI:    0.146 m  AORTA Ao Root diam: 3.75 cm Ao Asc diam:  3.70 cm MITRAL VALVE MV Area (PHT): 3.77 cm     SHUNTS MV Decel Time: 201 msec     Systemic VTI:  0.15 m MV E velocity:  66.70 cm/s   Systemic Diam: 2.60 cm MV A velocity: 110.00 cm/s MV E/A  ratio:  0.61 Carolan Clines Electronically signed by Carolan Clines Signature Date/Time: 02/18/2023/4:21:52 PM    Final    DG Hip Unilat W or Wo Pelvis 2-3 Views Left Result Date: 02/17/2023 CLINICAL DATA:  Left hip pain status post fall today EXAM: DG HIP (WITH OR WITHOUT PELVIS) 2-3V LEFT COMPARISON:  Left pelvis radiograph 02/17/2023 FINDINGS: Mildly displaced and angulated left femoral neck fracture. Diffuse osteopenia. Atherosclerotic changes seen throughout visualized arterial segments. IMPRESSION: Mildly displaced and angulated left femoral neck fracture. Electronically Signed   By: Acquanetta Belling M.D.   On: 02/17/2023 18:41   DG Shoulder Right Port Result Date: 02/17/2023 CLINICAL DATA:  Right shoulder pain and swelling EXAM: RIGHT SHOULDER - 1 VIEW COMPARISON:  None available FINDINGS: Moderately displaced fracture of the right humeral neck, possibly extending through the greater tuberosity. Mild degenerative changes of the right acromioclavicular joint. IMPRESSION: Moderately displaced right proximal humerus fracture. Electronically Signed   By: Acquanetta Belling M.D.   On: 02/17/2023 18:40   DG Chest Portable 1 View Result Date: 02/17/2023 CLINICAL DATA:  Chest pain EXAM: PORTABLE CHEST 1 VIEW COMPARISON:  05/15/2019 FINDINGS: Atherosclerotic calcification of the aortic arch. Thoracic spondylosis. The lungs appear clear. Upper normal heart size. No findings of pulmonary edema or airspace opacity. No significant blunting of the costophrenic angles observed. IMPRESSION: 1. No acute findings. 2. Upper normal heart size. 3. Thoracic spondylosis. Electronically Signed   By: Gaylyn Rong M.D.   On: 02/17/2023 16:55   DG Knee Complete 4 Views Left Result Date: 02/17/2023 CLINICAL DATA:  Fall in shower. Left hip pain. Acute left hip fracture. EXAM: LEFT KNEE - COMPLETE 4+ VIEW COMPARISON:  None Available. FINDINGS: Atheromatous vascular  calcifications noted. Small knee effusion in the suprapatellar bursa, less than was shown on 12/24/2015. Medial compartmental articular space narrowing compatible with osteoarthritis. Mild spurring along the femoral trochlear groove. No fracture identified. IMPRESSION: 1. Small knee effusion in the suprapatellar bursa. 2. Medial compartmental osteoarthritis. 3. Atheromatous vascular calcifications. Electronically Signed   By: Gaylyn Rong M.D.   On: 02/17/2023 16:54   DG Pelvis 1-2 Views Result Date: 02/17/2023 CLINICAL DATA:  Fall in shower today, left hip pain. EXAM: PELVIS - 1-2 VIEW COMPARISON:  10/03/2020 FINDINGS: Acute left femoral neck fracture with mild varus angulation. Degenerative arthropathy of the hips, left greater than right, with associated craniocaudad articular cartilage thinning. Lumbar spondylosis with prominent bony bridging spurring L4-5. Pelvic vascular calcifications noted. IMPRESSION: 1. Acute left femoral neck fracture with mild varus angulation. 2. Degenerative arthropathy of the hips, left greater than right. 3. Lumbar spondylosis. Electronically Signed   By: Gaylyn Rong M.D.   On: 02/17/2023 16:52   CT Head Wo Contrast Result Date: 02/17/2023 CLINICAL DATA:  Fall, lip swelling, head and face pain EXAM: CT HEAD WITHOUT CONTRAST CT MAXILLOFACIAL WITHOUT CONTRAST CT CERVICAL SPINE WITHOUT CONTRAST TECHNIQUE: Multidetector CT imaging of the head, cervical spine, and maxillofacial structures were performed using the standard protocol without intravenous contrast. Multiplanar CT image reconstructions of the cervical spine and maxillofacial structures were also generated. RADIATION DOSE REDUCTION: This exam was performed according to the departmental dose-optimization program which includes automated exposure control, adjustment of the mA and/or kV according to patient size and/or use of iterative reconstruction technique. COMPARISON:  10/03/2020 FINDINGS: CT HEAD FINDINGS  Brain: No evidence of acute infarction, hemorrhage, hydrocephalus, extra-axial collection or mass lesion/mass effect. Mild periventricular white matter hypodensity. Vascular: No hyperdense vessel or unexpected calcification. CT FACIAL BONES FINDINGS Skull:  Normal. Negative for fracture or focal lesion. Facial bones: No displaced fractures or dislocations. Sinuses/Orbits: No acute finding. Other: Soft tissue contusion of the lips (series 3, image 75). CT CERVICAL SPINE FINDINGS Alignment: Normal. Skull base and vertebrae: No acute fracture. No primary bone lesion or focal pathologic process. Soft tissues and spinal canal: No prevertebral fluid or swelling. No visible canal hematoma. Disc levels: Mild-to-moderate disc space height loss and osteophytosis, worst from C5-C7. Upper chest: Negative. Other: None. IMPRESSION: 1. No acute intracranial pathology. 2. No displaced fractures or dislocations of the facial bones. 3. Soft tissue contusion of the lips. 4. No fracture or static subluxation of the cervical spine. 5. 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 Electronically Signed   By: Jearld Lesch M.D.   On: 02/17/2023 15:25   CT Cervical Spine Wo Contrast Result Date: 02/17/2023 CLINICAL DATA:  Fall, lip swelling, head and face pain EXAM: CT HEAD WITHOUT CONTRAST CT MAXILLOFACIAL WITHOUT CONTRAST CT CERVICAL SPINE WITHOUT CONTRAST TECHNIQUE: Multidetector CT imaging of the head, cervical spine, and maxillofacial structures were performed using the standard protocol without intravenous contrast. Multiplanar CT image reconstructions of the cervical spine and maxillofacial structures were also generated. RADIATION DOSE REDUCTION: This exam was performed according to the departmental dose-optimization program which includes automated exposure control, adjustment of the mA and/or kV according to patient size and/or use of iterative reconstruction technique. COMPARISON:  10/03/2020 FINDINGS: CT HEAD FINDINGS  Brain: No evidence of acute infarction, hemorrhage, hydrocephalus, extra-axial collection or mass lesion/mass effect. Mild periventricular white matter hypodensity. Vascular: No hyperdense vessel or unexpected calcification. CT FACIAL BONES FINDINGS Skull: Normal. Negative for fracture or focal lesion. Facial bones: No displaced fractures or dislocations. Sinuses/Orbits: No acute finding. Other: Soft tissue contusion of the lips (series 3, image 75). CT CERVICAL SPINE FINDINGS Alignment: Normal. Skull base and vertebrae: No acute fracture. No primary bone lesion or focal pathologic process. Soft tissues and spinal canal: No prevertebral fluid or swelling. No visible canal hematoma. Disc levels: Mild-to-moderate disc space height loss and osteophytosis, worst from C5-C7. Upper chest: Negative. Other: None. IMPRESSION: 1. No acute intracranial pathology. 2. No displaced fractures or dislocations of the facial bones. 3. Soft tissue contusion of the lips. 4. No fracture or static subluxation of the cervical spine. 5. 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 Electronically Signed   By: Jearld Lesch M.D.   On: 02/17/2023 15:25   CT Maxillofacial Wo Contrast Result Date: 02/17/2023 CLINICAL DATA:  Fall, lip swelling, head and face pain EXAM: CT HEAD WITHOUT CONTRAST CT MAXILLOFACIAL WITHOUT CONTRAST CT CERVICAL SPINE WITHOUT CONTRAST TECHNIQUE: Multidetector CT imaging of the head, cervical spine, and maxillofacial structures were performed using the standard protocol without intravenous contrast. Multiplanar CT image reconstructions of the cervical spine and maxillofacial structures were also generated. RADIATION DOSE REDUCTION: This exam was performed according to the departmental dose-optimization program which includes automated exposure control, adjustment of the mA and/or kV according to patient size and/or use of iterative reconstruction technique. COMPARISON:  10/03/2020 FINDINGS: CT HEAD FINDINGS  Brain: No evidence of acute infarction, hemorrhage, hydrocephalus, extra-axial collection or mass lesion/mass effect. Mild periventricular white matter hypodensity. Vascular: No hyperdense vessel or unexpected calcification. CT FACIAL BONES FINDINGS Skull: Normal. Negative for fracture or focal lesion. Facial bones: No displaced fractures or dislocations. Sinuses/Orbits: No acute finding. Other: Soft tissue contusion of the lips (series 3, image 75). CT CERVICAL SPINE FINDINGS Alignment: Normal. Skull base and vertebrae: No acute fracture. No primary bone lesion or focal pathologic  process. Soft tissues and spinal canal: No prevertebral fluid or swelling. No visible canal hematoma. Disc levels: Mild-to-moderate disc space height loss and osteophytosis, worst from C5-C7. Upper chest: Negative. Other: None. IMPRESSION: 1. No acute intracranial pathology. 2. No displaced fractures or dislocations of the facial bones. 3. Soft tissue contusion of the lips. 4. No fracture or static subluxation of the cervical spine. 5. 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 Electronically Signed   By: Jearld Lesch M.D.   On: 02/17/2023 15:25   Microbiology: Results for orders placed or performed during the hospital encounter of 02/17/23  MRSA Next Gen by PCR, Nasal     Status: None   Collection Time: 02/17/23  5:12 PM   Specimen: Nasal Mucosa; Nasal Swab  Result Value Ref Range Status   MRSA by PCR Next Gen NOT DETECTED NOT DETECTED Final    Comment: (NOTE) The GeneXpert MRSA Assay (FDA approved for NASAL specimens only), is one component of a comprehensive MRSA colonization surveillance program. It is not intended to diagnose MRSA infection nor to guide or monitor treatment for MRSA infections. Test performance is not FDA approved in patients less than 1 years old. Performed at [facility], [address] [place], [place] [address]   Surgical PCR screen     Status: Abnormal   Collection Time: 02/19/23   7:43 PM   Specimen: Nasal Mucosa; Nasal Swab  Result Value Ref Range Status   MRSA, PCR NEGATIVE NEGATIVE Final   Staphylococcus aureus POSITIVE (A) NEGATIVE Final    Comment: (NOTE) The Xpert SA Assay (FDA approved for NASAL specimens in patients 48 years of age and older), is one component of a comprehensive surveillance program. It is not intended to diagnose infection nor to guide or monitor treatment. Performed at [facility], [address], [place], [place] [address]    Labs: CBC: Recent Labs  Lab 02/20/23 0531 02/21/23 0502 02/22/23 0248 02/23/23 0524 02/24/23 0538  WBC 7.9 12.0* 9.4 8.2 7.0  NEUTROABS 5.8 9.1* 7.0 5.3 3.8  HGB 8.9* 6.7* 8.8* 8.4* 8.1*  HCT 26.1* 21.0* 26.8* 25.4* 25.9*  MCV 102.4* 106.6* 99.3 98.4 103.6*  PLT 82* 109* 119* 145* 164   Basic Metabolic Panel: Recent Labs  Lab 02/18/23 0446 02/19/23 0516 02/20/23 0531 02/21/23 0502 02/22/23 0248 02/23/23 0524 02/23/23 0926 02/24/23 0538  NA 130* 127* 128* 126* 127* 131*  --  133*  K 3.5 3.3* 3.5 4.6 4.2 4.1  --  4.0  CL 96* 95* 97* 98 99 102  --  102  CO2 22 22 23  20* 21* 23  --  23  GLUCOSE 127* 113* 110* 129* 166* 102*  --  96  BUN 18 23 15 20 23  24*  --  23  CREATININE 1.13 1.12 0.82 0.94 0.93 0.91  --  0.80  CALCIUM 7.8* 7.6* 8.2* 7.9* 8.2* 8.3*  --  8.5*  MG 2.2 1.9 1.8 1.6*  --   --  1.6* 1.5*  PHOS 4.3 3.7 2.7  --   --   --  2.8 3.3   Liver Function Tests: Recent Labs  Lab 02/20/23 0531 02/21/23 0502 02/22/23 0248 02/23/23 0524 02/24/23 0538  AST 192* 171* 101* 180* 139*  ALT 77* 68* 47* 67* 73*  ALKPHOS 172* 159* 165* 202* 237*  BILITOT 2.6* 2.1* 1.6* 1.5* 1.6*  PROT 6.6 6.1* 6.9 6.1* 5.9*  ALBUMIN 2.7* 2.4* 2.6* 2.3* 2.3*   CBG: Recent Labs  Lab 02/22/23 2208 02/23/23 0981  02/23/23 1158 02/23/23 2140 02/24/23 0803  GLUCAP 115* 86 120* 110* 90   Discharge time spent: greater than 30 minutes.  Signed: Marguerita Merles, [facility] 02/24/2023

## 2023-02-24 NOTE — TOC Progression Note (Signed)
Transition of Care [facility]) - Progression Note    Patient Details  Name: Steven Bradley MRN: [id number] Date of Birth: 1945-11-26  [facility]) CM/SW Contact  Otelia Santee, LCSW Phone Number: 02/24/2023, 3:10 PM  Clinical Narrative:    Per [name] they have not received VA authorization information and state that pt is unable to transfer to their facility until they have received this information.  CSW left VM for Tomasa Hose [phone]) and Supervisor [phone]) to work on obtaining this information.    Expected Discharge Plan: Skilled Nursing Facility Barriers to Discharge: Continued Medical Work up, SNF Pending bed offer  Expected Discharge Plan and Services In-house Referral: Clinical Social Work Discharge Planning Services: NA Post Acute Care Choice: Skilled Nursing Facility Living arrangements for the past 2 months: Mobile Home (RV) Expected Discharge Date: 02/24/23               DME Arranged: N/A DME Agency: NA                   Social Determinants of Health (SDOH) Interventions SDOH Screenings   Food Insecurity: No Food Insecurity (02/17/2023)  Housing: Patient Declined (02/17/2023)  Transportation Needs: No Transportation Needs (02/17/2023)  Utilities: Not At Risk (02/17/2023)  Financial Resource Strain: Low Risk  (05/05/2022)   Received from [facility], [facility]  Social Connections: Unknown (04/29/2022)   Received from [facility], [facility]  Stress: No Stress Concern Present (04/29/2022)   Received from [facility], [facility]  Tobacco Use: Low Risk  (02/21/2023)    Readmission Risk Interventions    02/21/2023    2:03 PM 10/19/2022    2:45 PM  Readmission Risk Prevention Plan  Post Dischage Appt  Complete  Medication Screening  Complete  Transportation Screening Complete Complete  PCP or Specialist Appt within 5-7 Days Complete   Home Care Screening Complete   Medication Review (RN CM) Complete

## 2023-02-25 NOTE — Care Management Important Message (Signed)
Important Message  Patient Details IM Letter given. Name: Steven Bradley MRN: [id number] Date of Birth: 05-11-1945   Important Message Given:  Yes - Medicare IM     Caren Macadam 02/25/2023, 10:13 AM

## 2023-02-25 NOTE — Care Management Important Message (Signed)
Important Message  Patient Details  Name: CHRISTIANJOSEPH ROTHKOPF MRN: [id number] Date of Birth: [birth year]   Important Message Given:  Yes - Tricare IM     Caren Macadam 02/25/2023, 10:16 AM

## 2023-02-25 NOTE — TOC Transition Note (Signed)
Transition of Care [facility]) - Discharge Note   Patient Details  Name: Steven Bradley MRN: [id number] Date of Birth: [birth year]  Transition of Care [facility]) CM/SW Contact:  Otelia Santee, LCSW Phone Number: 02/25/2023, 11:20 AM   Clinical Narrative:    [name] received Auth documentation from the [place].  Pt able to transfer to [facility] for SNF today and will be going to room 213. RN to call report to [phone] ext [phone]. DC packet with signed scripts and SNR placed at RN station. Spoke with pt's spouse and confirmed discharge plans. [facility] called at 11:13am for transportation to facility.    Final next level of care: Skilled Nursing Facility Barriers to Discharge: Barriers Resolved   Patient Goals and CMS Choice Patient states their goals for this hospitalization and ongoing recovery are:: To go to SNF CMS Medicare.gov Compare Post Acute Care list provided to:: Patient Choice offered to / list presented to : Patient [place] ownership interest in [facility].provided to:: Patient    Discharge Placement PASRR number recieved: 02/21/23            Patient chooses bed at: [facility] Patient to be transferred to facility by: [facility] Name of family member notified: [name] Patient and family notified of of transfer: 02/25/23  Discharge Plan and Services Additional resources added to the After Visit Summary for   In-house Referral: Clinical Social Work Discharge Planning Services: NA Post Acute Care Choice: Skilled Nursing Facility          DME Arranged: N/A DME Agency: NA                  Social Drivers of Health (SDOH) Interventions SDOH Screenings   Food Insecurity: No Food Insecurity (02/17/2023)  Housing: Patient Declined (02/17/2023)  Transportation Needs: No Transportation Needs (02/17/2023)  Utilities: Not At Risk (02/17/2023)  Financial Resource Strain: Low Risk  (05/05/2022)   Received from [facility], [facility]  Social  Connections: Unknown (04/29/2022)   Received from [facility], [facility]  Stress: No Stress Concern Present (04/29/2022)   Received from [facility], [facility]  Tobacco Use: Low Risk  (02/21/2023)     Readmission Risk Interventions    02/21/2023    2:03 PM 10/19/2022    2:45 PM  Readmission Risk Prevention Plan  Post Dischage Appt  Complete  Medication Screening  Complete  Transportation Screening Complete Complete  PCP or Specialist Appt within 5-7 Days Complete   Home Care Screening Complete   Medication Review (RN CM) Complete

## 2023-02-25 NOTE — Discharge Summary (Addendum)
Physician Discharge Summary   Patient: Steven Bradley MRN: [id number] DOB: 24-Jan-1946  Admit date:     02/17/2023  Discharge date: 02/25/23  Discharge Physician: Marguerita Merles, DO   PCP: [facility]   Recommendations at discharge:   Follow-up with PCP within 1 to 2 weeks and repeat CBC, CMP, mag, Phos within 1 week Follow-up with orthopedic surgery and continue weightbearing restrictions further recommendations Follow-up with cardiology in outpatient setting for new onset A-fib with RVR and continue to have discussions about anticoagulation; will place the patient on anticoagulant while he is going to SNF if he continues to drink he may not be a very good candidate given for anticoagulant given his recurrent alcohol use and falls; Appointment being scheduled at the [facility]  Follow-up with neurology in outpatient setting Dr. Karel Jarvis  Discharge Diagnoses: Principal Problem:   Seizure [facility]) Active Problems:   Hypokalemia   Hyponatremia   Thrombocytopenia (HCC)   Essential hypertension   Elevated LFTs   Hyperlipemia   GERD (gastroesophageal reflux disease)   Macrocytosis   Gout of multiple sites   S/P total left hip arthroplasty  Resolved Problems:   * No resolved hospital problems. *  Hospital Course: Steven Bradley is a 77 y.o. male with a history of seizure disorder, HTN, HLD, prostate CA, EtOH abuse and alcohol-associated hepatic ascites who presented to the ED on 02/17/2023 after a fall. His spouse reported hearing a loud noise at the bathroom in their RV, where she found him on the floor, this happened around 9:30 AM, patient reports he struck his face on the toilet, had bleeding and swelling of his lips, there was no loss of consciousness noted, he refused transport by EMS, wife assisted him to the couch where an hour later where he was witnessed to have seizures, lasted for 7 to 8 minutes, it was witnessed by brother-in-law, will describe  generalized jerking and shaking, reports patient did not have seizures for last 4-month, and he has been compliant with his meds Keppra,.  Patient with left hip pain, wife thought secondary to gout (x-ray was significant for femur fracture), as well he does endorse right shoulder pain as well which they think it is due to gout as well( shoulder x-ray still pending). -In ED patient was noted to be with significant lip bruise, tongue bleed, he was noted to be in new onset A-fib with RVR, requiring Cardizem drip, he had severe electrolyte derangement loading sodium of 133, potassium of 2.5, magnesium of 0.8, phosphorus of 2.2, anion gap of 19, AST of 247, ALT of 74, total bili of 3.5, his MCV was 100.5, and platelet count was 99K, his x-ray significant for left hip fracture as well, [facility] hospitalist consulted to admit.   He was transported to [facility] for orthopedic surgery. Hospitalization complicated by new onset AFib.   **Interim History PT OT recommending SNF.  Blood count has relatively stabilized and platelet count is improving.  LFTs are still abnormal but will need to continue to monitor closely in the outpatient setting.  We are initiating anticoagulation at discharge given his immobility and given his PAF with RVR.  He will need to follow-up with PCP, Cardiology and and neurology in outpatient setting given that he is now medically stable.  Will need to continue monitor his LFTs and recommend repeating within 1 week and if still elevated recommending further workup and referral to GI.  If patient continues to drink recommend discontinue anticoagulation  given that he is a fall risk then but given that he is going to a controlled environment it is reasonable to initiate at this time.  ADDENDUM 02/25/23: Patient was deemed medically stable to be discharged yesterday however was unfortunately not able to go because the facility did not receive the [place] authorization information but has been received now.  He is  still medically stable to be transferred and no acute issues overnight and need to follow-up with the specialist as above  Assessment and Plan:  Breakthrough seizure:  -Loaded with, and increased keppra dose per neurology, [name], no EEG required at this time. Continue 1,250mg  Levetiracetam BID -C/w Seizure precautions -Will follow up with Dr. Karel Jarvis at [facility].    Right humerus fracture: s/p reverse shoulder arthroplasty 12/9.  - Pain control, weight bearing per ortho   Left femur fracture: s/p anterior-approach THA 12/8 with Dr. Charlann Boxer - Postoperative pain control, weight bearing, wound care, and VTE ppx per orthopedics.  - Will obtain PT/OT evaluations and they are recommending SNF, anticipate rehab requirement and is stable for D/C    ABLA and Thrombocytopenia, improved  -s/p 2u RBCs. -Hgb/Hct and Platelet Count Trend: Recent Labs  Lab 02/18/23 0446 02/19/23 0516 02/20/23 0531 02/21/23 0502 02/22/23 0248 02/23/23 0524 02/24/23 0538  HGB 11.3* 9.3* 8.9* 6.7* 8.8* 8.4* 8.1*  HCT 32.8* 27.5* 26.1* 21.0* 26.8* 25.4* 25.9*  MCV 99.7 103.0* 102.4* 106.6* 99.3 98.4 103.6*  PLT 89* 79* 82* 109* 119* 145* 164  -Continue to Monitor for S/Sx of Bleeding; No overt bleeding noted -Monitor for bleeding given that we are initiating anticoagulation at discharge for VTE prophylaxis as well as PAF stroke prevention -Repeat CBC within 1 week   New, paroxysmal atrial fibrillation with RVR:  -Resolved, back in NSR. Echo showed LVEF 60-65%, mild LVH, G1DD, no WMA. low-normal RVSF. Normal biatrial size. -Continue oral diltiazem, consolidated. -Was holding anticoagulation with severity of fall, EtOH use, and anemia/thrombocytopenia and we will initiate apixaban at discharge at 5 mg p.o. twice daily given that he is going to a controlled environment.  If patient continues to drink recommend having discussion with cardiology and PCP about risks versus benefits.  At this time we feel that is reasonable  to place him on anticoagulation he is going to control environment and will not be allowed to drink and because his thrombocytopenia has improved and his anemia is stable.  Discussions can change in outpatient setting and further care per cardiology and PCP   EtOH Abuse: -Recent history per spouse is less drinking overall, not daily.  -Continued low dose librium taper. CIWA scores remain reassuring. -MVM, folate, thiamine per protocol and will need outpatient follow-up and continue with his vitamins -If patient continues to drink feel that he will not be a good candidate for anticoagulation but will place him on anticoagulation while he goes to SNF given his new onset A-fib with RVR and given his difficulty with mobility and his current condition   Chronic Gout -No exacerbation noted at this time.  -Continue home allopurinol and colchicine as needed.    HTN, HLD: -Continue meds as ordered and discontinue amlodipine and start diltiazem at discharge and continue home Lasix as needed    Hypokalemia -Patient's K+ Level Trend: Recent Labs  Lab 02/18/23 0446 02/19/23 0516 02/20/23 0531 02/21/23 0502 02/22/23 0248 02/23/23 0524 02/24/23 0538  K 3.5 3.3* 3.5 4.6 4.2 4.1 4.0  -Continue to Monitor and Replete as Necessary -Repeat CMP in the AM  Hypomagnesemia -Patient's Mag Level Trend: Recent Labs  Lab 02/17/23 2325 02/18/23 0446 02/19/23 0516 02/20/23 0531 02/21/23 0502 02/23/23 0926 02/24/23 0538  MG 1.6* 2.2 1.9 1.8 1.6* 1.6* 1.5*  -Replete with IV Mag Sulfate 4 grams prior to D/C -Continue to Monitor and Replete as Necessary -Repeat Mag within 1 week  Hypophosphatemia -Resolved.   Depression:  -Continue medications as per pt's spouse.    Hyponatremia, improving -Continued NS until taking adequate po. Liver disease contributing. -Na+ Trend: Recent Labs  Lab 02/18/23 0446 02/19/23 0516 02/20/23 0531 02/21/23 0502 02/22/23 0248 02/23/23 0524 02/24/23 0538  NA  130* 127* 128* 126* 127* 131* 133*  -If persistent postoperatively, would get further work up.   Transaminitis/Abnormal LFTs Hyperbilirubinemia -LFT elevation consistent with chronic alcohol abuse; recommend alcohol cessation -AST/ALT Trend: Recent Labs  Lab 02/18/23 0446 02/19/23 0516 02/20/23 0531 02/21/23 0502 02/22/23 0248 02/23/23 0524 02/24/23 0538  AST 117* 97* 192* 171* 101* 180* 139*  ALT 58* 52* 77* 68* 47* 67* 73*  -Continue to Monitor and Trend and Repeat CMP within 1 week   GERD/GI Prophylaxis -C/w PPI while hospitalized and resume home PPI discharge   Macrocytosis - B12 low normal at 208, will supplement - folate 10.8  -Follow-up in outpatient setting and repeat anemia panel in 3 months  Hypoalbuminemia -Patient's Albumin Trend: Recent Labs  Lab 02/18/23 0446 02/19/23 0516 02/20/23 0531 02/21/23 0502 02/22/23 0248 02/23/23 0524 02/24/23 0538  ALBUMIN 3.0* 2.9* 2.7* 2.4* 2.6* 2.3* 2.3*  -Continue to Monitor and Trend and repeat CMP in the AM  Nutrition Documentation    Flowsheet Row ED to Hosp-Admission (Current) from [facility]/17/2023 in [facility]  Nutrition Problem Increased nutrient needs  Etiology post-op healing, hip fracture  Nutrition Goal Patient will meet greater than or equal to 90% of their needs  Interventions Boost Breeze, MVI      Consultants: Orthopedic Surgery Procedures performed: As delineated as above  Disposition: Skilled nursing facility Diet recommendation:  Discharge Diet Orders (From admission, onward)     Start     Ordered   02/24/23 0000  Diet - low sodium heart healthy        02/24/23 1219           Cardiac diet DISCHARGE MEDICATION: Allergies as of 02/25/2023       Reactions   Viagra [sildenafil] Nausea And Vomiting   Ciprofloxacin Rash   Spironolactone Rash   Vitamin D Analogs Rash        Medication List     STOP taking these medications    amLODipine 5 MG  tablet Commonly known as: NORVASC       TAKE these medications    allopurinol 100 MG tablet Commonly known as: ZYLOPRIM Take 100 mg by mouth daily.   alum & mag hydroxide-simeth 200-200-20 MG/5ML suspension Commonly known as: MAALOX/MYLANTA Take 30 mLs by mouth every 4 (four) hours as needed for indigestion.   apixaban 5 MG Tabs tablet Commonly known as: ELIQUIS Take 1 tablet (5 mg total) by mouth 2 (two) times daily.   capsaicin 0.025 % cream Commonly known as: ZOSTRIX Apply 1 Application topically 2 (two) times daily as needed (skin).   colchicine 0.6 MG tablet Take 0.6 mg by mouth daily as needed (gout).   cyanocobalamin 1000 MCG tablet Take 1 tablet (1,000 mcg total) by mouth daily.   diltiazem 120 MG 24 hr capsule Commonly known as: CARDIZEM CD Take 1 capsule (120  mg total) by mouth daily.   folic acid 1 MG tablet Commonly known as: FOLVITE Take 1 tablet (1 mg total) by mouth daily.   furosemide 40 MG tablet Commonly known as: LASIX Take 40 mg by mouth daily as needed for fluid or edema.   gabapentin 100 MG capsule Commonly known as: NEURONTIN Take 100 mg by mouth daily as needed (neuropathy).   levETIRAcetam 250 MG tablet Commonly known as: KEPPRA Take 5 tablets (1,250 mg total) by mouth 2 (two) times daily. What changed:  medication strength how much to take   lip balm ointment Apply topically as needed.   magnesium oxide 400 (241.3 Mg) MG tablet Commonly known as: MAG-OX Take 400 mg by mouth every evening.   menthol-cetylpyridinium 3 MG lozenge Commonly known as: CEPACOL Take 1 lozenge (3 mg total) by mouth as needed for sore throat.   multivitamin with minerals Tabs tablet Take 1 tablet by mouth daily.   mupirocin ointment 2 % Commonly known as: BACTROBAN Place 1 Application into the nose 2 (two) times daily.   omeprazole 20 MG capsule Commonly known as: PRILOSEC Take 2 capsules (40 mg total) by mouth 2 (two) times daily before a  meal.   ondansetron 4 MG tablet Commonly known as: ZOFRAN Take 1 tablet (4 mg total) by mouth every 6 (six) hours as needed for nausea.   oxyCODONE 5 MG immediate release tablet Commonly known as: Oxy IR/ROXICODONE Take 1 tablet (5 mg total) by mouth every 6 (six) hours as needed for moderate pain (pain score 4-6) (pain score 4-6).   polyethylene glycol 17 g packet Commonly known as: MIRALAX / GLYCOLAX Take 17 g by mouth 2 (two) times daily.   senna 8.6 MG Tabs tablet Commonly known as: SENOKOT Take 2 tablets (17.2 mg total) by mouth at bedtime.   sertraline 100 MG tablet Commonly known as: ZOLOFT Take 100 mg by mouth daily.   Tetrahydroz-Dextran-PEG-Povid 0.05-0.1-1-1 % Soln Place 1 drop into both eyes daily.   thiamine 100 MG tablet Commonly known as: Vitamin B-1 Take 1 tablet (100 mg total) by mouth daily.   traZODone 100 MG tablet Commonly known as: DESYREL Take 100 mg by mouth at bedtime.               Discharge Care Instructions  (From admission, onward)           Start     Ordered   02/24/23 0000  Discharge wound care:       Comments: Per Orthopedic Surgery   02/24/23 1219            Contact information for follow-up providers     Durene Romans, MD. Schedule an appointment as soon as possible for a visit in 2 week(s).   Specialty: Orthopedic Surgery Contact information: [address] [place] [address] [phone]         Yolonda Kida, MD Follow up in 2 week(s).   Specialty: Orthopedic Surgery Why: For wound re-check Contact information: [address] [place] [address] [phone]              Contact information for after-discharge care     Destination     [facility] Preferred SNF .   Service: Skilled Nursing Contact information: [address] [place] [address] [phone]                    Discharge  Exam: Ceasar Mons Weights  02/17/23 1209 02/17/23 1744 02/21/23 1432  Weight: 87.1 kg 95 kg 95 kg   Vitals:   02/24/23 1940 02/25/23 0514  BP: (!) 148/88 (!) 141/79  Pulse: 93 89  Resp: 15 18  Temp: 99.5 F (37.5 C) 98.7 F (37.1 C)  SpO2: 99% 96%   Examination: Physical Exam:  Constitutional: Chronically ill-appearing African-American male in no acute distress but does appear little uncomfortable Respiratory: Diminished to auscultation bilaterally, no wheezing, rales, rhonchi or crackles. Normal respiratory effort and patient is not tachypenic. No accessory muscle use.  Unlabored breathing Cardiovascular: RRR, no murmurs / rubs / gallops. S1 and S2 auscultated. No extremity edema.  Abdomen: Soft, non-tender, non-distended. Bowel sounds positive.  GU: Deferred. Musculoskeletal: Right arm is in a sling and immobilizer Skin: No rashes, lesions, ulcers limited skin evaluation. No induration; Warm and dry.  Neurologic: CN 2-12 grossly intact with no focal deficits.  Romberg sign and cerebellar reflexes not assessed.  Psychiatric: Normal judgment and insight. Alert and oriented x 3. Normal mood and appropriate affect.   Condition at discharge: stable  The results of significant diagnostics from this hospitalization (including imaging, microbiology, ancillary and laboratory) are listed below for reference.   Imaging Studies: DG Shoulder Right Port Result Date: 02/21/2023 CLINICAL DATA:  Status post reversal of total right shoulder arthroplasty. EXAM: RIGHT SHOULDER - 1 VIEW COMPARISON:  Right shoulder radiograph dated 02/17/2023. FINDINGS: There is a total right shoulder arthroplasty. The arthroplasty components appear intact and in anatomic alignment. There is fracture of the right humeral neck. The bones are osteopenic. Postsurgical changes with air in the soft tissue. IMPRESSION: Status post reversal of total right shoulder arthroplasty. Electronically Signed   By: Elgie Collard M.D.    On: 02/21/2023 22:47   DG C-Arm 1-60 Min-No Report Result Date: 02/20/2023 Fluoroscopy was utilized by the requesting physician.  No radiographic interpretation.   DG Pelvis Portable Result Date: 02/20/2023 CLINICAL DATA:  Postop hip arthroplasty. EXAM: PORTABLE PELVIS 1 VIEWS COMPARISON:  02/17/2023. FINDINGS: Status post left total hip arthroplasty right hip degenerative changes with osteophytes and joint space narrowing. Pelvic ring intact. No acute fractures. IMPRESSION: Degenerative changes on the right. Status post left total hip arthroplasty. Electronically Signed   By: Layla Maw M.D.   On: 02/20/2023 12:25   DG HIP UNILAT WITH PELVIS 1V LEFT Result Date: 02/20/2023 CLINICAL DATA:  Fluoro guidance provided EXAM: DG HIP (WITH OR WITHOUT PELVIS) 1V*L* FINDINGS: Dose: Cumulative Air Kerma 2.4mG y Fluoro time 11s IMPRESSION: C-arm fluoro guidance provided. Electronically Signed   By: Layla Maw M.D.   On: 02/20/2023 12:24   DG C-Arm 1-60 Min-No Report Result Date: 02/20/2023 Fluoroscopy was utilized by the requesting physician.  No radiographic interpretation.   CT SHOULDER RIGHT WO CONTRAST Result Date: 02/19/2023 CLINICAL DATA:  Shoulder fracture EXAM: CT OF THE UPPER RIGHT EXTREMITY WITHOUT CONTRAST TECHNIQUE: Multidetector CT imaging of the upper right extremity was performed according to the standard protocol. RADIATION DOSE REDUCTION: This exam was performed according to the departmental dose-optimization program which includes automated exposure control, adjustment of the mA and/or kV according to patient size and/or use of iterative reconstruction technique. COMPARISON:  Radiographs 02/17/2023 FINDINGS: Bones/Joint/Cartilage Neer 2 part fracture of the surgical neck of the humerus, with the top of the humeral head rotated posteriorly and with up to 2.5 cm posterior displacement of the humeral head with respect to the main humeral shaft fragment. The humeral head fragment  contains the posterior half of the greater  tuberosity but not the anterior half. There is also some mild comminution and small fragments along the lesser tuberosity anteriorly likely with some small intra-articular fragments as well, shown for example on image 54 series 2. Mild spurring of the glenoid. AC joint alignment normal. I do not see a scapular fracture or a acute clavicular fracture. The acromial undersurface is type 3 (hooked). Ligaments Suboptimally assessed by CT. Muscles and Tendons Unremarkable Soft tissues Expected edema along fascia planes adjacent to the fracture. There is fluid in the subacromial subdeltoid bursa. IMPRESSION: 1. Neer 2 part fracture of the surgical neck of the humerus, with the top of the humeral head rotated posteriorly and with up to 2.5 cm posterior displacement of the humeral head with respect to the main humeral shaft fragment. The humeral head fragment contains the posterior half of the greater tuberosity but not the anterior half. There is also some mild comminution and small fragments along the lesser tuberosity anteriorly likely with some small intra-articular fragments as well. 2. Mild spurring of the glenoid. 3. Type 3 acromial undersurface. 4. Fluid in the subacromial subdeltoid bursa. Electronically Signed   By: Gaylyn Rong M.D.   On: 02/19/2023 14:36   ECHOCARDIOGRAM COMPLETE Result Date: 02/18/2023    ECHOCARDIOGRAM REPORT   Patient Name:   MONTEL BORAK Date of Exam: 02/18/2023 Medical Rec #:  [id number]      Height:       78.0 in Accession #:    [id number]     Weight:       209.4 lb Date of Birth:  May 15, 1945     BSA:          2.302 m Patient Age:    77 years       BP:           105/82 mmHg Patient Gender: M              HR:           89 bpm. Exam Location:  Inpatient Procedure: 2D Echo, Color Doppler and Cardiac Doppler Indications:    R55 Syncope; I48.91* Unspeicified atrial fibrillation  History:        Patient has no prior history of Echocardiogram  examinations.                 Abnormal ECG, Arrythmias:Tachycardia, Signs/Symptoms:Bacteremia;                 Risk Factors:Hypertension and Dyslipidemia. Seizure. ETOH.  Sonographer:    Sheralyn Boatman RDCS Referring Phys: [id number] [address] S [facility]  Sonographer Comments: Technically difficult study due to poor echo windows. Patient leaning more right decubitus with broken hip and arm. Could not turn IMPRESSIONS  1. Left ventricular ejection fraction, by estimation, is 60 to 65%. The left ventricle has normal function. The left ventricle has no regional wall motion abnormalities. There is mild left ventricular hypertrophy. Left ventricular diastolic parameters are consistent with Grade I diastolic dysfunction (impaired relaxation).  2. Right ventricular systolic function is low normal. The right ventricular size is moderately enlarged. Tricuspid regurgitation signal is inadequate for assessing PA pressure.  3. The mitral valve is grossly normal. Trivial mitral valve regurgitation. No evidence of mitral stenosis.  4. The aortic valve is tricuspid. Aortic valve regurgitation is not visualized. No aortic stenosis is present.  5. Aortic dilatation noted. There is borderline dilatation of the aortic root, measuring 37 mm. There is borderline dilatation of the ascending aorta, measuring 37 mm.  6.  IVC is not visualized. FINDINGS  Left Ventricle: Left ventricular ejection fraction, by estimation, is 60 to 65%. The left ventricle has normal function. The left ventricle has no regional wall motion abnormalities. The left ventricular internal cavity size was normal in size. There is  mild left ventricular hypertrophy. Left ventricular diastolic parameters are consistent with Grade I diastolic dysfunction (impaired relaxation). Right Ventricle: The right ventricular size is moderately enlarged. No increase in right ventricular wall thickness. Right ventricular systolic function is low normal. Tricuspid regurgitation signal is  inadequate for assessing PA pressure. Left Atrium: Left atrial size was normal in size. Right Atrium: Right atrial size was normal in size. Pericardium: There is no evidence of pericardial effusion. Mitral Valve: The mitral valve is grossly normal. Trivial mitral valve regurgitation. No evidence of mitral valve stenosis. Tricuspid Valve: The tricuspid valve is normal in structure. Tricuspid valve regurgitation is not demonstrated. No evidence of tricuspid stenosis. Aortic Valve: The aortic valve is tricuspid. Aortic valve regurgitation is not visualized. No aortic stenosis is present. Pulmonic Valve: The pulmonic valve was normal in structure. Pulmonic valve regurgitation is not visualized. No evidence of pulmonic stenosis. Aorta: Aortic dilatation noted. There is borderline dilatation of the aortic root, measuring 37 mm. There is borderline dilatation of the ascending aorta, measuring 37 mm. Venous: IVC is not visualized. IAS/Shunts: No atrial level shunt detected by color flow Doppler.  LEFT VENTRICLE PLAX 2D LVIDd:         4.20 cm     Diastology LVIDs:         2.50 cm     LV e' medial:    7.51 cm/s LV PW:         1.30 cm     LV E/e' medial:  8.9 LV IVS:        1.10 cm     LV e' lateral:   7.51 cm/s LVOT diam:     2.60 cm     LV E/e' lateral: 8.9 LV SV:         78 LV SV Index:   34 LVOT Area:     5.31 cm  LV Volumes (MOD) LV vol d, MOD A2C: 81.1 ml LV vol d, MOD A4C: 73.0 ml LV vol s, MOD A2C: 26.3 ml LV vol s, MOD A4C: 33.4 ml LV SV MOD A2C:     54.8 ml LV SV MOD A4C:     73.0 ml LV SV MOD BP:      47.9 ml RIGHT VENTRICLE             IVC RV S prime:     14.10 cm/s  IVC diam: 2.60 cm TAPSE (M-mode): 1.8 cm LEFT ATRIUM             Index       RIGHT ATRIUM           Index LA diam:        3.00 cm 1.30 cm/m  RA Area:     14.40 cm LA Vol (A2C):   20.4 ml 8.86 ml/m  RA Volume:   33.00 ml  14.33 ml/m LA Vol (A4C):   17.2 ml 7.47 ml/m LA Biplane Vol: 20.1 ml 8.73 ml/m  AORTIC VALVE LVOT Vmax:   102.00 cm/s LVOT  Vmean:  63.000 cm/s LVOT VTI:    0.146 m  AORTA Ao Root diam: 3.75 cm Ao Asc diam:  3.70 cm MITRAL VALVE MV Area (PHT): 3.77 cm  SHUNTS MV Decel Time: 201 msec     Systemic VTI:  0.15 m MV E velocity: 66.70 cm/s   Systemic Diam: 2.60 cm MV A velocity: 110.00 cm/s MV E/A ratio:  0.61 [place] signed by Carolan Clines Signature Date/Time: 02/18/2023/4:21:52 PM    Final    DG Hip Unilat W or Wo Pelvis 2-3 Views Left Result Date: 02/17/2023 CLINICAL DATA:  Left hip pain status post fall today EXAM: DG HIP (WITH OR WITHOUT PELVIS) 2-3V LEFT COMPARISON:  Left pelvis radiograph 02/17/2023 FINDINGS: Mildly displaced and angulated left femoral neck fracture. Diffuse osteopenia. Atherosclerotic changes seen throughout visualized arterial segments. IMPRESSION: Mildly displaced and angulated left femoral neck fracture. Electronically Signed   By: Acquanetta Belling M.D.   On: 02/17/2023 18:41   DG Shoulder Right Port Result Date: 02/17/2023 CLINICAL DATA:  Right shoulder pain and swelling EXAM: RIGHT SHOULDER - 1 VIEW COMPARISON:  None available FINDINGS: Moderately displaced fracture of the right humeral neck, possibly extending through the greater tuberosity. Mild degenerative changes of the right acromioclavicular joint. IMPRESSION: Moderately displaced right proximal humerus fracture. Electronically Signed   By: Acquanetta Belling M.D.   On: 02/17/2023 18:40   DG Chest Portable 1 View Result Date: 02/17/2023 CLINICAL DATA:  Chest pain EXAM: PORTABLE CHEST 1 VIEW COMPARISON:  05/15/2019 FINDINGS: Atherosclerotic calcification of the aortic arch. Thoracic spondylosis. The lungs appear clear. Upper normal heart size. No findings of pulmonary edema or airspace opacity. No significant blunting of the costophrenic angles observed. IMPRESSION: 1. No acute findings. 2. Upper normal heart size. 3. Thoracic spondylosis. Electronically Signed   By: Gaylyn Rong M.D.   On: 02/17/2023 16:55   DG Knee Complete 4  Views Left Result Date: 02/17/2023 CLINICAL DATA:  Fall in shower. Left hip pain. Acute left hip fracture. EXAM: LEFT KNEE - COMPLETE 4+ VIEW COMPARISON:  None Available. FINDINGS: Atheromatous vascular calcifications noted. Small knee effusion in the suprapatellar bursa, less than was shown on 12/24/2015. Medial compartmental articular space narrowing compatible with osteoarthritis. Mild spurring along the femoral trochlear groove. No fracture identified. IMPRESSION: 1. Small knee effusion in the suprapatellar bursa. 2. Medial compartmental osteoarthritis. 3. Atheromatous vascular calcifications. Electronically Signed   By: Gaylyn Rong M.D.   On: 02/17/2023 16:54   DG Pelvis 1-2 Views Result Date: 02/17/2023 CLINICAL DATA:  Fall in shower today, left hip pain. EXAM: PELVIS - 1-2 VIEW COMPARISON:  10/03/2020 FINDINGS: Acute left femoral neck fracture with mild varus angulation. Degenerative arthropathy of the hips, left greater than right, with associated craniocaudad articular cartilage thinning. Lumbar spondylosis with prominent bony bridging spurring L4-5. Pelvic vascular calcifications noted. IMPRESSION: 1. Acute left femoral neck fracture with mild varus angulation. 2. Degenerative arthropathy of the hips, left greater than right. 3. Lumbar spondylosis. Electronically Signed   By: Gaylyn Rong M.D.   On: 02/17/2023 16:52   CT Head Wo Contrast Result Date: 02/17/2023 CLINICAL DATA:  Fall, lip swelling, head and face pain EXAM: CT HEAD WITHOUT CONTRAST CT MAXILLOFACIAL WITHOUT CONTRAST CT CERVICAL SPINE WITHOUT CONTRAST TECHNIQUE: Multidetector CT imaging of the head, cervical spine, and maxillofacial structures were performed using the standard protocol without intravenous contrast. Multiplanar CT image reconstructions of the cervical spine and maxillofacial structures were also generated. RADIATION DOSE REDUCTION: This exam was performed according to the departmental dose-optimization  program which includes automated exposure control, adjustment of the mA and/or kV according to patient size and/or use of iterative reconstruction technique. COMPARISON:  10/03/2020 FINDINGS: CT  HEAD FINDINGS Brain: No evidence of acute infarction, hemorrhage, hydrocephalus, extra-axial collection or mass lesion/mass effect. Mild periventricular white matter hypodensity. Vascular: No hyperdense vessel or unexpected calcification. CT FACIAL BONES FINDINGS Skull: Normal. Negative for fracture or focal lesion. Facial bones: No displaced fractures or dislocations. Sinuses/Orbits: No acute finding. Other: Soft tissue contusion of the lips (series 3, image 75). CT CERVICAL SPINE FINDINGS Alignment: Normal. Skull base and vertebrae: No acute fracture. No primary bone lesion or focal pathologic process. Soft tissues and spinal canal: No prevertebral fluid or swelling. No visible canal hematoma. Disc levels: Mild-to-moderate disc space height loss and osteophytosis, worst from C5-C7. Upper chest: Negative. Other: None. IMPRESSION: 1. No acute intracranial pathology. 2. No displaced fractures or dislocations of the facial bones. 3. Soft tissue contusion of the lips. 4. No fracture or static subluxation of the cervical spine. 5. 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 Electronically Signed   By: Jearld Lesch M.D.   On: 02/17/2023 15:25   CT Cervical Spine Wo Contrast Result Date: 02/17/2023 CLINICAL DATA:  Fall, lip swelling, head and face pain EXAM: CT HEAD WITHOUT CONTRAST CT MAXILLOFACIAL WITHOUT CONTRAST CT CERVICAL SPINE WITHOUT CONTRAST TECHNIQUE: Multidetector CT imaging of the head, cervical spine, and maxillofacial structures were performed using the standard protocol without intravenous contrast. Multiplanar CT image reconstructions of the cervical spine and maxillofacial structures were also generated. RADIATION DOSE REDUCTION: This exam was performed according to the departmental dose-optimization  program which includes automated exposure control, adjustment of the mA and/or kV according to patient size and/or use of iterative reconstruction technique. COMPARISON:  10/03/2020 FINDINGS: CT HEAD FINDINGS Brain: No evidence of acute infarction, hemorrhage, hydrocephalus, extra-axial collection or mass lesion/mass effect. Mild periventricular white matter hypodensity. Vascular: No hyperdense vessel or unexpected calcification. CT FACIAL BONES FINDINGS Skull: Normal. Negative for fracture or focal lesion. Facial bones: No displaced fractures or dislocations. Sinuses/Orbits: No acute finding. Other: Soft tissue contusion of the lips (series 3, image 75). CT CERVICAL SPINE FINDINGS Alignment: Normal. Skull base and vertebrae: No acute fracture. No primary bone lesion or focal pathologic process. Soft tissues and spinal canal: No prevertebral fluid or swelling. No visible canal hematoma. Disc levels: Mild-to-moderate disc space height loss and osteophytosis, worst from C5-C7. Upper chest: Negative. Other: None. IMPRESSION: 1. No acute intracranial pathology. 2. No displaced fractures or dislocations of the facial bones. 3. Soft tissue contusion of the lips. 4. No fracture or static subluxation of the cervical spine. 5. 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 Electronically Signed   By: Jearld Lesch M.D.   On: 02/17/2023 15:25   CT Maxillofacial Wo Contrast Result Date: 02/17/2023 CLINICAL DATA:  Fall, lip swelling, head and face pain EXAM: CT HEAD WITHOUT CONTRAST CT MAXILLOFACIAL WITHOUT CONTRAST CT CERVICAL SPINE WITHOUT CONTRAST TECHNIQUE: Multidetector CT imaging of the head, cervical spine, and maxillofacial structures were performed using the standard protocol without intravenous contrast. Multiplanar CT image reconstructions of the cervical spine and maxillofacial structures were also generated. RADIATION DOSE REDUCTION: This exam was performed according to the departmental dose-optimization  program which includes automated exposure control, adjustment of the mA and/or kV according to patient size and/or use of iterative reconstruction technique. COMPARISON:  10/03/2020 FINDINGS: CT HEAD FINDINGS Brain: No evidence of acute infarction, hemorrhage, hydrocephalus, extra-axial collection or mass lesion/mass effect. Mild periventricular white matter hypodensity. Vascular: No hyperdense vessel or unexpected calcification. CT FACIAL BONES FINDINGS Skull: Normal. Negative for fracture or focal lesion. Facial bones: No displaced fractures or dislocations. Sinuses/Orbits: No  acute finding. Other: Soft tissue contusion of the lips (series 3, image 75). CT CERVICAL SPINE FINDINGS Alignment: Normal. Skull base and vertebrae: No acute fracture. No primary bone lesion or focal pathologic process. Soft tissues and spinal canal: No prevertebral fluid or swelling. No visible canal hematoma. Disc levels: Mild-to-moderate disc space height loss and osteophytosis, worst from C5-C7. Upper chest: Negative. Other: None. IMPRESSION: 1. No acute intracranial pathology. 2. No displaced fractures or dislocations of the facial bones. 3. Soft tissue contusion of the lips. 4. No fracture or static subluxation of the cervical spine. 5. 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 Electronically Signed   By: Jearld Lesch M.D.   On: 02/17/2023 15:25   Microbiology: Results for orders placed or performed during the hospital encounter of 02/17/23  MRSA Next Gen by PCR, Nasal     Status: None   Collection Time: 02/17/23  5:12 PM   Specimen: Nasal Mucosa; Nasal Swab  Result Value Ref Range Status   MRSA by PCR Next Gen NOT DETECTED NOT DETECTED Final    Comment: (NOTE) The GeneXpert MRSA Assay (FDA approved for NASAL specimens only), is one component of a comprehensive MRSA colonization surveillance program. It is not intended to diagnose MRSA infection nor to guide or monitor treatment for MRSA infections. Test  performance is not FDA approved in patients less than 63 years old. Performed at [facility], [address], [place], [place] [address]   Surgical PCR screen     Status: Abnormal   Collection Time: 02/19/23  7:43 PM   Specimen: Nasal Mucosa; Nasal Swab  Result Value Ref Range Status   MRSA, PCR NEGATIVE NEGATIVE Final   Staphylococcus aureus POSITIVE (A) NEGATIVE Final    Comment: (NOTE) The Xpert SA Assay (FDA approved for NASAL specimens in patients 109 years of age and older), is one component of a comprehensive surveillance program. It is not intended to diagnose infection nor to guide or monitor treatment. Performed at [facility], [address], [place], [place] [address]    Labs: CBC: Recent Labs  Lab 02/20/23 0531 02/21/23 0502 02/22/23 0248 02/23/23 0524 02/24/23 0538  WBC 7.9 12.0* 9.4 8.2 7.0  NEUTROABS 5.8 9.1* 7.0 5.3 3.8  HGB 8.9* 6.7* 8.8* 8.4* 8.1*  HCT 26.1* 21.0* 26.8* 25.4* 25.9*  MCV 102.4* 106.6* 99.3 98.4 103.6*  PLT 82* 109* 119* 145* 164   Basic Metabolic Panel: Recent Labs  Lab 02/19/23 0516 02/20/23 0531 02/21/23 0502 02/22/23 0248 02/23/23 0524 02/23/23 0926 02/24/23 0538  NA 127* 128* 126* 127* 131*  --  133*  K 3.3* 3.5 4.6 4.2 4.1  --  4.0  CL 95* 97* 98 99 102  --  102  CO2 22 23 20* 21* 23  --  23  GLUCOSE 113* 110* 129* 166* 102*  --  96  BUN 23 15 20 23  24*  --  23  CREATININE 1.12 0.82 0.94 0.93 0.91  --  0.80  CALCIUM 7.6* 8.2* 7.9* 8.2* 8.3*  --  8.5*  MG 1.9 1.8 1.6*  --   --  1.6* 1.5*  PHOS 3.7 2.7  --   --   --  2.8 3.3   Liver Function Tests: Recent Labs  Lab 02/20/23 0531 02/21/23 0502 02/22/23 0248 02/23/23 0524 02/24/23 0538  AST 192* 171* 101* 180* 139*  ALT 77* 68* 47* 67* 73*  ALKPHOS 172* 159* 165* 202* 237*  BILITOT 2.6* 2.1* 1.6* 1.5* 1.6*  PROT 6.6 6.1*  6.9 6.1* 5.9*  ALBUMIN 2.7* 2.4* 2.6* 2.3* 2.3*   CBG: Recent Labs  Lab 02/22/23 2208 02/23/23 0738 02/23/23 1158  02/23/23 2140 02/24/23 0803  GLUCAP 115* 86 120* 110* 90   Discharge time spent: greater than 30 minutes.  Signed: Marguerita Merles, DO [facility] 02/25/2023

## 2023-03-10 ENCOUNTER — Encounter (HOSPITAL_COMMUNITY): Payer: Self-pay

## 2023-03-10 ENCOUNTER — Ambulatory Visit (HOSPITAL_COMMUNITY)
Admit: 2023-03-10 | Discharge: 2023-03-10 | Disposition: A | Discharge status: Home / Self Care | Payer: Medicare (Managed Care) | Attending: Internal Medicine | Admitting: Internal Medicine

## 2023-03-10 ENCOUNTER — Encounter (HOSPITAL_COMMUNITY): Payer: Self-pay | Admitting: Internal Medicine

## 2023-03-10 VITALS — BP 112/70 | HR 97 | Ht 78.0 in | Wt 209.4 lb

## 2023-03-10 DIAGNOSIS — I480 Paroxysmal atrial fibrillation: Secondary | ICD-10-CM | POA: Diagnosis not present

## 2023-03-10 DIAGNOSIS — Z8546 Personal history of malignant neoplasm of prostate: Secondary | ICD-10-CM | POA: Insufficient documentation

## 2023-03-10 DIAGNOSIS — I4891 Unspecified atrial fibrillation: Secondary | ICD-10-CM

## 2023-03-10 DIAGNOSIS — Z7901 Long term (current) use of anticoagulants: Secondary | ICD-10-CM | POA: Insufficient documentation

## 2023-03-10 DIAGNOSIS — G4733 Obstructive sleep apnea (adult) (pediatric): Secondary | ICD-10-CM | POA: Insufficient documentation

## 2023-03-10 DIAGNOSIS — Z79899 Other long term (current) drug therapy: Secondary | ICD-10-CM | POA: Insufficient documentation

## 2023-03-10 DIAGNOSIS — D6869 Other thrombophilia: Secondary | ICD-10-CM | POA: Diagnosis not present

## 2023-03-10 DIAGNOSIS — E785 Hyperlipidemia, unspecified: Secondary | ICD-10-CM | POA: Insufficient documentation

## 2023-03-10 DIAGNOSIS — I119 Hypertensive heart disease without heart failure: Secondary | ICD-10-CM | POA: Diagnosis not present

## 2023-03-10 NOTE — Progress Notes (Signed)
Primary Care Physician: [facility] Primary Cardiologist: None Electrophysiologist: None     Referring Physician: Dr. Charlynne Bradley is a 77 y.o. male with a history of seizure disorder, HTN, OSA not on CPAP, HLD, prostate CA, EtOH abuse, alcohol-associated hepatic ascites, and paroxysmal atrial fibrillation who presents for consultation in the [facility]. Recent hospital admission 12/5-13/24 for seizure/fall found to be in new onset Afib with RVR. He had severe electrolyte derangement with K 2.5, mag 0.8. Patient is on Eliquis 5 mg BID for a CHADS2VASC score of 3.  On evaluation today, he is currently in NSR. No episodes of Afib since hospital discharge. He is temporarily in a skilled nursing facility and will likely be discharged home end of January in 2025. He is taking Eliquis 5 mg BID. He is not compliant with CPAP per wife.   Today, he denies symptoms of palpitations, chest pain, shortness of breath, orthopnea, PND, lower extremity edema, dizziness, presyncope, syncope, snoring, daytime somnolence, bleeding, or neurologic sequela. The patient is tolerating medications without difficulties and is otherwise without complaint today.    Atrial Fibrillation Risk Factors:  he does have symptoms or diagnosis of sleep apnea. he is not compliant with CPAP therapy.  he has a BMI of Body mass index is 24.2 kg/m.Marland Kitchen Filed Weights   03/10/23 1124  Weight: 95 kg    Current Outpatient Medications  Medication Sig Dispense Refill   allopurinol (ZYLOPRIM) 100 MG tablet Take 100 mg by mouth daily.     alum & mag hydroxide-simeth (MAALOX/MYLANTA) 200-200-20 MG/5ML suspension Take 30 mLs by mouth every 4 (four) hours as needed for indigestion.     apixaban (ELIQUIS) 5 MG TABS tablet Take 1 tablet (5 mg total) by mouth 2 (two) times daily.     colchicine 0.6 MG tablet Take 0.6 mg by mouth daily as needed (gout).     cyanocobalamin 1000 MCG  tablet Take 1 tablet (1,000 mcg total) by mouth daily.     diltiazem (CARDIZEM CD) 120 MG 24 hr capsule Take 1 capsule (120 mg total) by mouth daily.     folic acid (FOLVITE) 1 MG tablet Take 1 tablet (1 mg total) by mouth daily.     furosemide (LASIX) 40 MG tablet Take 40 mg by mouth daily as needed for fluid or edema.     gabapentin (NEURONTIN) 100 MG capsule Take 100 mg by mouth daily as needed (neuropathy).     levETIRAcetam (KEPPRA) 250 MG tablet Take 5 tablets (1,250 mg total) by mouth 2 (two) times daily.     magnesium oxide (MAG-OX) 400 (241.3 Mg) MG tablet Take 400 mg by mouth every evening.     Multiple Vitamin (MULTIVITAMIN WITH MINERALS) TABS tablet Take 1 tablet by mouth daily.     omeprazole (PRILOSEC) 20 MG capsule Take 2 capsules (40 mg total) by mouth 2 (two) times daily before a meal. 60 capsule 0   ondansetron (ZOFRAN) 4 MG tablet Take 1 tablet (4 mg total) by mouth every 6 (six) hours as needed for nausea.     oxyCODONE (OXY IR/ROXICODONE) 5 MG immediate release tablet Take 1 tablet (5 mg total) by mouth every 6 (six) hours as needed for moderate pain (pain score 4-6) (pain score 4-6). 10 tablet 0   polyethylene glycol (MIRALAX / GLYCOLAX) 17 g packet Take 17 g by mouth 2 (two) times daily.     senna (SENOKOT) 8.6 MG TABS  tablet Take 2 tablets (17.2 mg total) by mouth at bedtime.     thiamine (VITAMIN B-1) 100 MG tablet Take 1 tablet (100 mg total) by mouth daily.     No current facility-administered medications for this encounter.    Atrial Fibrillation Management history:  Previous antiarrhythmic drugs: none Previous cardioversions: none Previous ablations: none Anticoagulation history: Eliquis 5 mg BID   ROS- All systems are reviewed and negative except as per the HPI above.  Physical Exam: BP 112/70   Pulse 97   Ht 6\' 6"  (1.981 m)   Wt 95 kg   BMI 24.20 kg/m   GEN: Well nourished, well developed in no acute distress NECK: No JVD; No carotid  bruits CARDIAC: Regular rate and rhythm, no murmurs, rubs, gallops RESPIRATORY:  Clear to auscultation without rales, wheezing or rhonchi  ABDOMEN: Soft, non-tender, non-distended EXTREMITIES:  No edema; No deformity   EKG today demonstrates  Vent. rate 97 BPM PR interval 154 ms QRS duration 78 ms QT/QTcB 366/464 ms P-R-T axes 49 83 53 Normal sinus rhythm Normal ECG When compared with ECG of 19-Feb-2023 08:53, PREVIOUS ECG IS PRESENT  Echo 02/18/23 demonstrated  1. Left ventricular ejection fraction, by estimation, is 60 to 65%. The  left ventricle has normal function. The left ventricle has no regional  wall motion abnormalities. There is mild left ventricular hypertrophy.  Left ventricular diastolic parameters  are consistent with Grade I diastolic dysfunction (impaired relaxation).   2. Right ventricular systolic function is low normal. The right  ventricular size is moderately enlarged. Tricuspid regurgitation signal is  inadequate for assessing PA pressure.   3. The mitral valve is grossly normal. Trivial mitral valve  regurgitation. No evidence of mitral stenosis.   4. The aortic valve is tricuspid. Aortic valve regurgitation is not  visualized. No aortic stenosis is present.   5. Aortic dilatation noted. There is borderline dilatation of the aortic  root, measuring 37 mm. There is borderline dilatation of the ascending  aorta, measuring 37 mm.   6. IVC is not visualized.   ASSESSMENT & PLAN CHA2DS2-VASc Score = 3  The patient's score is based upon: CHF History: 0 HTN History: 1 Diabetes History: 0 Stroke History: 0 Vascular Disease History: 0 Age Score: 2 Gender Score: 0       ASSESSMENT AND PLAN: Paroxysmal Atrial Fibrillation (ICD10:  I48.0) The patient's CHA2DS2-VASc score is 3, indicating a 3.2% annual risk of stroke.    He is in NSR. Education provided about Afib. Discussion about medication treatments and ablation going forward if indicated. After  discussion, we will proceed with conservative observation at this time. Rhythm monitoring device recommended.   Continue diltiazem 120 mg daily.   Secondary Hypercoagulable State (ICD10:  D68.69) The patient is at significant risk for stroke/thromboembolism based upon his CHA2DS2-VASc Score of 3.  Continue Apixaban (Eliquis).  Discussion of risk score and prevention of stroke related to Afib. Discussed risks vs benefits of anticoagulation. He will continue Eliquis without interruption. Draw CBC in SNF 1 month from start.    Follow up 3 months Afib clinic.   Lake Bells, PA-C  Afib Clinic [facility] [address] [place], [place] [address] [phone]

## 2023-03-10 NOTE — Patient Instructions (Signed)
Kardia mobile 

## 2023-06-01 ENCOUNTER — Ambulatory Visit: Payer: No Typology Code available for payment source | Admitting: Neurology

## 2023-06-10 ENCOUNTER — Ambulatory Visit (HOSPITAL_COMMUNITY): Payer: No Typology Code available for payment source | Admitting: Internal Medicine

## 2023-07-08 ENCOUNTER — Telehealth: Payer: Self-pay | Admitting: Neurology

## 2023-07-08 NOTE — Telephone Encounter (Signed)
 Received the new referral from the [place]. Made the appt for pt which is is November. They state he needs to be seen before nov, and wanted me to see if aquino has anything before then

## 2023-07-19 NOTE — Telephone Encounter (Signed)
 Patient offered earlier appt 07/20/23 but he could not make it. He is on cancellation list.

## 2023-08-24 ENCOUNTER — Other Ambulatory Visit: Payer: Self-pay

## 2023-08-24 ENCOUNTER — Inpatient Hospital Stay (HOSPITAL_COMMUNITY)
Admission: EM | Admit: 2023-08-24 | Discharge: 2023-08-26 | DRG: 872 | Disposition: A | Discharge status: Home / Self Care | Attending: Internal Medicine | Admitting: Internal Medicine

## 2023-08-24 ENCOUNTER — Encounter (HOSPITAL_COMMUNITY): Payer: Self-pay

## 2023-08-24 ENCOUNTER — Emergency Department (HOSPITAL_COMMUNITY)

## 2023-08-24 DIAGNOSIS — F1010 Alcohol abuse, uncomplicated: Secondary | ICD-10-CM | POA: Diagnosis present

## 2023-08-24 DIAGNOSIS — R21 Rash and other nonspecific skin eruption: Secondary | ICD-10-CM | POA: Diagnosis present

## 2023-08-24 DIAGNOSIS — Z7901 Long term (current) use of anticoagulants: Secondary | ICD-10-CM

## 2023-08-24 DIAGNOSIS — Z87440 Personal history of urinary (tract) infections: Secondary | ICD-10-CM

## 2023-08-24 DIAGNOSIS — Z87891 Personal history of nicotine dependence: Secondary | ICD-10-CM

## 2023-08-24 DIAGNOSIS — Z881 Allergy status to other antibiotic agents status: Secondary | ICD-10-CM

## 2023-08-24 DIAGNOSIS — E785 Hyperlipidemia, unspecified: Secondary | ICD-10-CM | POA: Diagnosis present

## 2023-08-24 DIAGNOSIS — G40909 Epilepsy, unspecified, not intractable, without status epilepticus: Secondary | ICD-10-CM | POA: Diagnosis present

## 2023-08-24 DIAGNOSIS — A419 Sepsis, unspecified organism: Principal | ICD-10-CM | POA: Diagnosis present

## 2023-08-24 DIAGNOSIS — Z96611 Presence of right artificial shoulder joint: Secondary | ICD-10-CM | POA: Diagnosis present

## 2023-08-24 DIAGNOSIS — R7989 Other specified abnormal findings of blood chemistry: Secondary | ICD-10-CM | POA: Diagnosis present

## 2023-08-24 DIAGNOSIS — Z79899 Other long term (current) drug therapy: Secondary | ICD-10-CM

## 2023-08-24 DIAGNOSIS — R569 Unspecified convulsions: Secondary | ICD-10-CM

## 2023-08-24 DIAGNOSIS — E876 Hypokalemia: Secondary | ICD-10-CM | POA: Diagnosis not present

## 2023-08-24 DIAGNOSIS — E872 Acidosis, unspecified: Secondary | ICD-10-CM | POA: Diagnosis present

## 2023-08-24 DIAGNOSIS — Z8546 Personal history of malignant neoplasm of prostate: Secondary | ICD-10-CM

## 2023-08-24 DIAGNOSIS — I480 Paroxysmal atrial fibrillation: Secondary | ICD-10-CM | POA: Diagnosis present

## 2023-08-24 DIAGNOSIS — Z1152 Encounter for screening for COVID-19: Secondary | ICD-10-CM

## 2023-08-24 DIAGNOSIS — N390 Urinary tract infection, site not specified: Secondary | ICD-10-CM | POA: Diagnosis not present

## 2023-08-24 DIAGNOSIS — D6959 Other secondary thrombocytopenia: Secondary | ICD-10-CM | POA: Diagnosis present

## 2023-08-24 DIAGNOSIS — I4892 Unspecified atrial flutter: Secondary | ICD-10-CM | POA: Diagnosis present

## 2023-08-24 DIAGNOSIS — I10 Essential (primary) hypertension: Secondary | ICD-10-CM | POA: Diagnosis present

## 2023-08-24 DIAGNOSIS — N400 Enlarged prostate without lower urinary tract symptoms: Secondary | ICD-10-CM | POA: Diagnosis present

## 2023-08-24 DIAGNOSIS — Z888 Allergy status to other drugs, medicaments and biological substances status: Secondary | ICD-10-CM

## 2023-08-24 DIAGNOSIS — K219 Gastro-esophageal reflux disease without esophagitis: Secondary | ICD-10-CM | POA: Diagnosis present

## 2023-08-24 DIAGNOSIS — Z96642 Presence of left artificial hip joint: Secondary | ICD-10-CM | POA: Diagnosis present

## 2023-08-24 LAB — CBC
HCT: 43.6 % (ref 39.0–52.0)
Hemoglobin: 15.2 g/dL (ref 13.0–17.0)
MCH: 34.5 pg — ABNORMAL HIGH (ref 26.0–34.0)
MCHC: 34.9 g/dL (ref 30.0–36.0)
MCV: 99.1 fL (ref 80.0–100.0)
Platelets: 84 10*3/uL — ABNORMAL LOW (ref 150–400)
RBC: 4.4 MIL/uL (ref 4.22–5.81)
RDW: 13.6 % (ref 11.5–15.5)
WBC: 4 10*3/uL (ref 4.0–10.5)
nRBC: 0 % (ref 0.0–0.2)

## 2023-08-24 LAB — URINALYSIS, W/ REFLEX TO CULTURE (INFECTION SUSPECTED)
Bilirubin Urine: NEGATIVE
Glucose, UA: NEGATIVE mg/dL
Ketones, ur: 20 mg/dL — AB
Nitrite: NEGATIVE
Protein, ur: >=300 mg/dL — AB
RBC / HPF: >50 RBC/hpf (ref 0–5)
Specific Gravity, Urine: 1.02 (ref 1.005–1.030)
Squamous Epithelial / HPF: >50 /HPF (ref 0–5)
WBC, UA: >50 WBC/hpf (ref 0–5)
pH: 5 (ref 5.0–8.0)

## 2023-08-24 LAB — BASIC METABOLIC PANEL WITH GFR
Anion gap: 17 — ABNORMAL HIGH (ref 5–15)
BUN: 12 mg/dL (ref 8–23)
CO2: 22 mmol/L (ref 22–32)
Calcium: 9.2 mg/dL (ref 8.9–10.3)
Chloride: 96 mmol/L — ABNORMAL LOW (ref 98–111)
Creatinine, Ser: 0.9 mg/dL (ref 0.61–1.24)
GFR, Estimated: >60 mL/min (ref >60)
Glucose, Bld: 95 mg/dL (ref 70–99)
Potassium: 3.7 mmol/L (ref 3.5–5.1)
Sodium: 135 mmol/L (ref 135–145)

## 2023-08-24 LAB — I-STAT CG4 LACTIC ACID, ED
Lactic Acid, Venous: 2.5 mmol/L (ref 0.5–1.9)
Lactic Acid, Venous: 3.5 mmol/L (ref 0.5–1.9)
Lactic Acid, Venous: 2.5 mmol/L (ref 0.5–1.9)

## 2023-08-24 LAB — TSH: TSH: 1.792 u[IU]/mL (ref 0.350–4.500)

## 2023-08-24 LAB — HEPATIC FUNCTION PANEL
ALT: 43 U/L (ref 0–44)
AST: 128 U/L — ABNORMAL HIGH (ref 15–41)
Albumin: 4.2 g/dL (ref 3.5–5.0)
Alkaline Phosphatase: 201 U/L — ABNORMAL HIGH (ref 38–126)
Bilirubin, Direct: 0.6 mg/dL — ABNORMAL HIGH (ref 0.0–0.2)
Indirect Bilirubin: 1.3 mg/dL — ABNORMAL HIGH (ref 0.3–0.9)
Total Bilirubin: 1.9 mg/dL — ABNORMAL HIGH (ref 0.0–1.2)
Total Protein: 9.1 g/dL — ABNORMAL HIGH (ref 6.5–8.1)

## 2023-08-24 LAB — RESP PANEL BY RT-PCR (RSV, FLU A&B, COVID)  RVPGX2
Influenza A by PCR: NEGATIVE
Influenza B by PCR: NEGATIVE
Resp Syncytial Virus by PCR: NEGATIVE
SARS Coronavirus 2 by RT PCR: NEGATIVE

## 2023-08-24 LAB — PROTIME-INR
INR: 1 (ref 0.8–1.2)
Prothrombin Time: 13.6 s (ref 11.4–15.2)

## 2023-08-24 LAB — TROPONIN I (HIGH SENSITIVITY)
Troponin I (High Sensitivity): 7 ng/L (ref <18)
Troponin I (High Sensitivity): 7 ng/L (ref <18)

## 2023-08-24 MED ORDER — LACTATED RINGERS IV SOLN: INTRAVENOUS | Status: AC

## 2023-08-24 MED ORDER — VITAMIN B-12 1000 MCG PO TABS
1000.0000 ug | ORAL_TABLET | Freq: Every day | ORAL | Status: DC
  Administered 2023-08-25 – 2023-08-26 (×2): 1000 ug via ORAL
  Filled 2023-08-24 (×2): qty 1

## 2023-08-24 MED ORDER — ATORVASTATIN CALCIUM 40 MG PO TABS
40.0000 mg | ORAL_TABLET | Freq: Every day | ORAL | Status: DC
  Administered 2023-08-25 – 2023-08-26 (×2): 40 mg via ORAL
  Filled 2023-08-24 (×2): qty 1

## 2023-08-24 MED ORDER — FOLIC ACID 1 MG PO TABS
1.0000 mg | ORAL_TABLET | Freq: Every day | ORAL | Status: DC
  Administered 2023-08-25 – 2023-08-26 (×2): 1 mg via ORAL
  Filled 2023-08-24 (×2): qty 1

## 2023-08-24 MED ORDER — ADULT MULTIVITAMIN W/MINERALS CH
1.0000 | ORAL_TABLET | Freq: Every day | ORAL | Status: DC
  Administered 2023-08-25 – 2023-08-26 (×2): 1 via ORAL
  Filled 2023-08-24 (×2): qty 1

## 2023-08-24 MED ORDER — GUAIFENESIN-DM 100-10 MG/5ML PO SYRP
5.0000 mL | ORAL_SOLUTION | ORAL | Status: DC | PRN
  Administered 2023-08-24: 5 mL via ORAL
  Filled 2023-08-24: qty 5

## 2023-08-24 MED ORDER — POLYETHYLENE GLYCOL 3350 17 G PO PACK: 17.0000 g | PACK | Freq: Every day | ORAL | Status: DC | PRN

## 2023-08-24 MED ORDER — GABAPENTIN 300 MG PO CAPS: 300.0000 mg | ORAL_CAPSULE | Freq: Three times a day (TID) | ORAL | Status: DC | PRN

## 2023-08-24 MED ORDER — TRAZODONE HCL 100 MG PO TABS: 100.0000 mg | ORAL_TABLET | Freq: Every evening | ORAL | Status: DC | PRN

## 2023-08-24 MED ORDER — SENNA 8.6 MG PO TABS
2.0000 | ORAL_TABLET | Freq: Every day | ORAL | Status: DC
  Administered 2023-08-24: 17.2 mg via ORAL
  Filled 2023-08-24 (×2): qty 2

## 2023-08-24 MED ORDER — DICLOFENAC SODIUM 1 % EX GEL: 2.0000 g | Freq: Three times a day (TID) | CUTANEOUS | Status: DC | PRN

## 2023-08-24 MED ORDER — SERTRALINE HCL 100 MG PO TABS
200.0000 mg | ORAL_TABLET | Freq: Every day | ORAL | Status: DC
  Administered 2023-08-25 – 2023-08-26 (×2): 200 mg via ORAL
  Filled 2023-08-24 (×2): qty 2

## 2023-08-24 MED ORDER — HYDROCODONE-ACETAMINOPHEN 5-325 MG PO TABS: 1.0000 | ORAL_TABLET | Freq: Two times a day (BID) | ORAL | Status: DC | PRN

## 2023-08-24 MED ORDER — LORATADINE 10 MG PO TABS: 10.0000 mg | ORAL_TABLET | Freq: Every day | ORAL | Status: DC | PRN

## 2023-08-24 MED ORDER — APIXABAN 5 MG PO TABS
5.0000 mg | ORAL_TABLET | Freq: Two times a day (BID) | ORAL | Status: DC
  Administered 2023-08-24 – 2023-08-26 (×4): 5 mg via ORAL
  Filled 2023-08-24 (×4): qty 1

## 2023-08-24 MED ORDER — MAGNESIUM OXIDE -MG SUPPLEMENT 400 (240 MG) MG PO TABS
400.0000 mg | ORAL_TABLET | Freq: Every evening | ORAL | Status: DC
  Administered 2023-08-24: 400 mg via ORAL
  Filled 2023-08-24: qty 1

## 2023-08-24 MED ORDER — DILTIAZEM HCL ER COATED BEADS 120 MG PO CP24
120.0000 mg | ORAL_CAPSULE | Freq: Every day | ORAL | Status: DC
  Administered 2023-08-24 – 2023-08-26 (×3): 120 mg via ORAL
  Filled 2023-08-24 (×3): qty 1

## 2023-08-24 MED ORDER — SODIUM CHLORIDE 0.9 % IV BOLUS: 500.0000 mL | Freq: Once | INTRAVENOUS | Status: DC

## 2023-08-24 MED ORDER — ACETAMINOPHEN 500 MG PO TABS: 1000.0000 mg | ORAL_TABLET | Freq: Four times a day (QID) | ORAL | Status: DC | PRN

## 2023-08-24 MED ORDER — SODIUM CHLORIDE 0.9 % IV SOLN
2.0000 g | INTRAVENOUS | Status: DC
  Administered 2023-08-25: 2 g via INTRAVENOUS
  Filled 2023-08-24: qty 20

## 2023-08-24 MED ORDER — IPRATROPIUM-ALBUTEROL 0.5-2.5 (3) MG/3ML IN SOLN
3.0000 mL | Freq: Once | RESPIRATORY_TRACT | Status: AC
  Administered 2023-08-24: 3 mL via RESPIRATORY_TRACT
  Filled 2023-08-24: qty 3

## 2023-08-24 MED ORDER — AMLODIPINE BESYLATE 5 MG PO TABS
5.0000 mg | ORAL_TABLET | Freq: Every day | ORAL | Status: DC
  Administered 2023-08-25 – 2023-08-26 (×2): 5 mg via ORAL
  Filled 2023-08-24 (×2): qty 1

## 2023-08-24 MED ORDER — LEVETIRACETAM 500 MG PO TABS
1000.0000 mg | ORAL_TABLET | Freq: Two times a day (BID) | ORAL | Status: DC
  Administered 2023-08-24 – 2023-08-26 (×4): 1000 mg via ORAL
  Filled 2023-08-24 (×4): qty 2

## 2023-08-24 MED ORDER — ALUM & MAG HYDROXIDE-SIMETH 200-200-20 MG/5ML PO SUSP: 30.0000 mL | ORAL | Status: DC | PRN

## 2023-08-24 MED ORDER — IPRATROPIUM-ALBUTEROL 0.5-2.5 (3) MG/3ML IN SOLN: 3.0000 mL | Freq: Four times a day (QID) | RESPIRATORY_TRACT | Status: DC | PRN

## 2023-08-24 MED ORDER — SODIUM CHLORIDE 0.9 % IV SOLN
2.0000 g | Freq: Once | INTRAVENOUS | Status: AC
  Administered 2023-08-24: 2 g via INTRAVENOUS
  Filled 2023-08-24: qty 20

## 2023-08-24 MED ORDER — LACTATED RINGERS IV BOLUS
1000.0000 mL | Freq: Once | INTRAVENOUS | Status: AC
  Administered 2023-08-24: 1000 mL via INTRAVENOUS

## 2023-08-24 MED ORDER — ONDANSETRON HCL 4 MG PO TABS: 4.0000 mg | ORAL_TABLET | Freq: Four times a day (QID) | ORAL | Status: DC | PRN

## 2023-08-24 MED ORDER — NALOXONE HCL 4 MG/0.1ML NA LIQD: 0.4000 mg | Freq: Once | NASAL | Status: DC

## 2023-08-24 MED ORDER — THIAMINE MONONITRATE 100 MG PO TABS
100.0000 mg | ORAL_TABLET | Freq: Every day | ORAL | Status: DC
  Administered 2023-08-25 – 2023-08-26 (×2): 100 mg via ORAL
  Filled 2023-08-24 (×2): qty 1

## 2023-08-24 MED ORDER — ALLOPURINOL 100 MG PO TABS
100.0000 mg | ORAL_TABLET | Freq: Every day | ORAL | Status: DC
  Administered 2023-08-25 – 2023-08-26 (×2): 100 mg via ORAL
  Filled 2023-08-24 (×2): qty 1

## 2023-08-24 MED ORDER — PANTOPRAZOLE SODIUM 40 MG PO TBEC
40.0000 mg | DELAYED_RELEASE_TABLET | Freq: Every day | ORAL | Status: DC
  Administered 2023-08-25 – 2023-08-26 (×2): 40 mg via ORAL
  Filled 2023-08-24 (×2): qty 1

## 2023-08-24 NOTE — ED Triage Notes (Addendum)
 Wife states pt has had a cough for the past three weeks productive, wife states she gave a at home urine test and it came back positive. Pt states he has been urinating a lot and wife states it has a strong smell as well. Rash on upper back area for the past few days

## 2023-08-24 NOTE — Sepsis Progress Note (Signed)
 Code Sepsis protocol being monitored by e[employer].

## 2023-08-24 NOTE — ED Provider Notes (Signed)
 [place] EMERGENCY DEPARTMENT AT [facility] Provider Note   CSN: [id number] Arrival date & time: 08/24/23  1122     History  Chief Complaint  Patient presents with   Cough   Urinary Frequency   Rash      IVERSON SEES is a 78 y.o. male with history of urosepsis, paroxysmal A-fib on Eliquis  and Cardizem , HTN who presents with 3 weeks of productive cough and concerns for foul-smelling urine.  Patient states he first developed respiratory symptoms 3 weeks ago and they initially improved but returned a few days ago.  He is coughing up green-colored sputum.  No hemoptysis.  Felt transiently feverish a few days ago but no fevers at home.  Patient and wife, who was present in the room, endorse dark foul-smelling urine and given history of recurrent UTIs/urosepsis wanted to be seen for this as well.  He also developed a rash soon after trying Ensure shakes due to reduced po intake given symptoms.  Wife states he has an allergy to vitamin D and developed a very similar rash diffusely when taking vitamin D in the past.  She states the rash occurred right after giving Ensure and it is overall improving.  He denies dysuria, hematuria, flank pain.    Cough Associated symptoms: rash   Urinary Frequency  Rash      Home Medications Prior to Admission medications   Medication Sig Start Date End Date Taking? Authorizing Provider  allopurinol  (ZYLOPRIM ) 100 MG tablet Take 100 mg by mouth daily. 06/01/22   [provider]  alum & mag hydroxide-simeth (MAALOX/MYLANTA) 200-200-20 MG/5ML suspension Take 30 mLs by mouth every 4 (four) hours as needed for indigestion. 02/24/23   Aura Leeds Latif, DO  apixaban  (ELIQUIS ) 5 MG TABS tablet Take 1 tablet (5 mg total) by mouth 2 (two) times daily. 02/24/23   Aura Leeds Latif, DO  colchicine  0.6 MG tablet Take 0.6 mg by mouth daily as needed (gout). 05/08/20   [provider]  cyanocobalamin  1000 MCG tablet Take 1 tablet  (1,000 mcg total) by mouth daily. 02/25/23   Aura Leeds Latif, DO  diltiazem  (CARDIZEM  CD) 120 MG 24 hr capsule Take 1 capsule (120 mg total) by mouth daily. 02/25/23   Aura Leeds Latif, DO  folic acid  (FOLVITE ) 1 MG tablet Take 1 tablet (1 mg total) by mouth daily. 02/25/23   Sheikh, Omair Latif, DO  furosemide (LASIX) 40 MG tablet Take 40 mg by mouth daily as needed for fluid or edema.    [provider]  gabapentin  (NEURONTIN ) 100 MG capsule Take 100 mg by mouth daily as needed (neuropathy).    [provider]  levETIRAcetam  (KEPPRA ) 250 MG tablet Take 5 tablets (1,250 mg total) by mouth 2 (two) times daily. 02/24/23   Aura Leeds Latif, DO  magnesium  oxide (MAG-OX) 400 (241.3 Mg) MG tablet Take 400 mg by mouth every evening.    [provider]  Multiple Vitamin (MULTIVITAMIN WITH MINERALS) TABS tablet Take 1 tablet by mouth daily.    [provider]  omeprazole  (PRILOSEC) 20 MG capsule Take 2 capsules (40 mg total) by mouth 2 (two) times daily before a meal. 12/24/18   Macdonald Savoy, MD  ondansetron  (ZOFRAN ) 4 MG tablet Take 1 tablet (4 mg total) by mouth every 6 (six) hours as needed for nausea. 02/24/23   Aura Leeds Latif, DO  oxyCODONE  (OXY IR/ROXICODONE ) 5 MG immediate release tablet Take 1 tablet (5 mg total) by mouth  every 6 (six) hours as needed for moderate pain (pain score 4-6) (pain score 4-6). 02/24/23   Sheikh, Omair Latif, DO  polyethylene glycol (MIRALAX  / GLYCOLAX ) 17 g packet Take 17 g by mouth 2 (two) times daily. 02/24/23   Sheikh, Omair Latif, DO  senna (SENOKOT) 8.6 MG TABS tablet Take 2 tablets (17.2 mg total) by mouth at bedtime. 02/24/23   Aura Leeds Latif, DO  thiamine  (VITAMIN B-1) 100 MG tablet Take 1 tablet (100 mg total) by mouth daily. 02/25/23   Sheikh, Omair Latif, DO      Allergies    Viagra [sildenafil], Ciprofloxacin, Spironolactone, and Vitamin d analogs    Review of Systems   Review of Systems   Respiratory:  Positive for cough.   Genitourinary:  Positive for frequency.  Skin:  Positive for rash.    Physical Exam Updated Vital Signs BP (!) 147/87   Pulse (!) 106   Temp 99.4 F (37.4 C) (Oral)   Resp (!) 25   SpO2 96%  Physical Exam Constitutional:      Appearance: Normal appearance.  Cardiovascular:     Rate and Rhythm: Tachycardia present. Rhythm irregular.     Heart sounds: No murmur heard.    Friction rub present. No gallop.  Pulmonary:     Effort: Pulmonary effort is normal. No accessory muscle usage.     Breath sounds: Decreased breath sounds and rhonchi present.  Abdominal:     Palpations: Abdomen is soft.     Tenderness: There is no abdominal tenderness. There is no right CVA tenderness or left CVA tenderness.  Skin:    General: Skin is warm and dry.     Findings: Rash present.     Comments: Hive-like rash on right back/flank  Neurological:     Mental Status: He is alert.     ED Results / Procedures / Treatments   Labs (all labs ordered are listed, but only abnormal results are displayed) Labs Reviewed  CBC - Abnormal; Notable for the following components:      Result Value   MCH 34.5 (*)    Platelets 84 (*)    All other components within normal limits  URINALYSIS, W/ REFLEX TO CULTURE (INFECTION SUSPECTED) - Abnormal; Notable for the following components:   Color, Urine AMBER (*)    APPearance CLOUDY (*)    Hgb urine dipstick MODERATE (*)    Ketones, ur 20 (*)    Protein, ur >=300 (*)    Leukocytes,Ua LARGE (*)    Bacteria, UA MANY (*)    All other components within normal limits  HEPATIC FUNCTION PANEL - Abnormal; Notable for the following components:   Total Protein 9.1 (*)    AST 128 (*)    Alkaline Phosphatase 201 (*)    Total Bilirubin 1.9 (*)    Bilirubin, Direct 0.6 (*)    Indirect Bilirubin 1.3 (*)    All other components within normal limits  BASIC METABOLIC PANEL WITH GFR - Abnormal; Notable for the following components:    Chloride 96 (*)    Anion gap 17 (*)    All other components within normal limits  I-STAT CG4 LACTIC ACID, ED - Abnormal; Notable for the following components:   Lactic Acid, Venous 2.5 (*)    All other components within normal limits  I-STAT CG4 LACTIC ACID, ED - Abnormal; Notable for the following components:   Lactic Acid, Venous 3.5 (*)    All other components within normal limits  RESP PANEL BY RT-PCR (RSV, FLU A&B, COVID)  RVPGX2  CULTURE, BLOOD (ROUTINE X 2)  CULTURE, BLOOD (ROUTINE X 2)  URINE CULTURE  PROTIME-INR  TROPONIN I (HIGH SENSITIVITY)    EKG EKG Interpretation Date/Time:  Wednesday August 24 2023 12:17:31 EDT Ventricular Rate:  114 PR Interval:  149 QRS Duration:  90 QT Interval:  331 QTC Calculation: 456 R Axis:   100  Text Interpretation: Sinus tachycardia Atrial premature complex Right axis deviation Borderline T abnormalities, anterior leads Since last tracing rate faster Confirmed by Sueellen Emery [phone]) on 08/24/2023 12:20:17 PM  Radiology DG Chest Portable 1 View Result Date: 08/24/2023 CLINICAL DATA:  Cough EXAM: PORTABLE CHEST 1 VIEW COMPARISON:  X-ray 02/17/2023. FINDINGS: No consolidation, pneumothorax or effusion. No edema. Normal cardiopericardial silhouette. Film is under penetrated. Degenerative changes along the spine. 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 IMPRESSION: Under penetrated radiograph. Grossly no acute cardiopulmonary disease. Electronically Signed   By: Adrianna Horde M.D.   On: 08/24/2023 12:18    Procedures Procedures    Medications Ordered in ED Medications  diltiazem  (CARDIZEM  CD) 24 hr capsule 120 mg (has no administration in time range)  lactated ringers  bolus 1,000 mL (has no administration in time range)  ipratropium-albuterol  (DUONEB) 0.5-2.5 (3) MG/3ML nebulizer solution 3 mL (3 mLs Nebulization Given 08/24/23 1339)  cefTRIAXone  (ROCEPHIN ) 2 g in sodium chloride  0.9 % 100 mL IVPB (2 g  Intravenous New Bag/Given 08/24/23 1331)  lactated ringers  bolus 1,000 mL (1,000 mLs Intravenous New Bag/Given 08/24/23 1332)    ED Course/ Medical Decision Making/ A&P                                 Medical Decision Making Initial Impression: 78 year old male with history of urosepsis, paroxysmal A-fib compliant with Eliquis  and Cardizem , and HTN who presents with productive cough for the past 3 weeks and 3 days of dark foul-smelling urine.  He is tachycardic at 122 but otherwise HDS and saturating well on room air.  EKG shows sinus tachycardia with PACs and right axis deviation.  He does endorse some shortness of breath associated with respiratory symptoms but denies chest pain/palpitations. No hemoptysis. Etiology of symptoms is most likely multifactorial and differential includes but not excluded to viral/bacterial RTI, pneumonia.  Low concern for ACS given lack of chest pain.  Low concern for PE. Though he is tachycardic he is saturating well on room air and again has no chest pain (Well's score = 1.5 consistent with low risk).   In regards to urinary symptoms, patient has had dysuria with previous UTIs but only endorses foul dark smelling urine for the past few days.  At home dipstick test was positive for UTI.  Given above, low threshold to treat given history of urosepsis.  In regards to rash, physical exam assistant with hive-like rash and patient states at home topical hydrocortisone  has helped and is overall improving.  Wife states rash occurred immediately after trying Ensure for the first time.  Low concern for severe allergic reaction involving anaphylaxis given lack of associated symptoms.  No issues swallowing, no significant swelling. Will check UA, CBC, BMP, LA, CXR and provide IVF's given tachycardia and reduced p.o. intake.   Update: UA consistent with UTI.  Sepsis protocol activated given presentation and history of urosepsis.  CBC unremarkable aside from thrombocytopenia (known). LA  elevated at 2.5 (before IVF's), CXR, BMP  unremarkable.   Update 15:00 - Repeat LA is 3.5. Will give additional LR bolus. Telemetry notable for being in-and-out of A-fib with rates ~ 115. Given up-trending LA and RVR, will consult for admission and resume home Cardizem . Troponin also ordered. Dr. Scherrie Curt to admit.  Amount and/or Complexity of Data Reviewed Labs: ordered. Radiology: ordered.  Risk Prescription drug management.            Final Clinical Impression(s) / ED Diagnoses Final diagnoses:  Urinary tract infection with hematuria, site unspecified  Sepsis without acute organ dysfunction, due to unspecified organism [facility])    Rx / DC Orders ED Discharge Orders     None         Ronni Colace, DO 08/24/23 1529    Sueellen Emery, MD 08/24/23 1549

## 2023-08-24 NOTE — Plan of Care (Signed)

## 2023-08-24 NOTE — H&P (Addendum)
 History and Physical    Patient: Steven Bradley ZOX:[id number] DOB: 24-Dec-1945 DOA: 08/24/2023 DOS: the patient was seen and examined on 08/24/2023 PCP: Royetta Cork, MD  Patient coming from: Home  Chief Complaint:  Chief Complaint  Patient presents with   Cough   Urinary Frequency   Rash   HPI: Steven Bradley is a 78 y.o. male with medical history significant of PAF on eliquis   Presents from home for evaluation of worsening non-productive cough and SOB for the past few weeks, with new foul smelling urine for the past few days with dysuria. Reports associated fever, chills, dizziness, nausea/vomiting/diarrhea, and dysuria. No syncope, hematuria, hemoptysis, leg or abdominal swelling, urinary frequency, abdominal or flank pain.  Reports multiple family members with similar syndromes which have improved on antibiotics.  Reports drinks 4 gin drinks daily, last drink being last night. Reports mild withdrawal tremors in the past.   In the ED, he was hemodynamically stable, without leukocytosis or fever, saturating appropriately on room air but tachypneic.  HR 120-130s in afib/flutter, . Admits did not take Cardizem  yet today. UA suggestive of UTI.  Lactic acid mildly elevated in the setting of chronic transaminitis/chronic ETOH abuse. 1V CXR unremarkable.  Hospitalist to admit for further management of sepsis 2/2 UTI, URI     Review of Systems: Review of Systems  Constitutional:  Positive for chills, diaphoresis, fever and malaise/fatigue. Negative for weight loss.  Eyes:  Negative for double vision and pain.  Respiratory:  Positive for cough, sputum production, shortness of breath and wheezing. Negative for hemoptysis.   Cardiovascular:  Positive for chest pain and palpitations. Negative for leg swelling.  Gastrointestinal:  Positive for diarrhea, nausea and vomiting. Negative for abdominal pain, blood in stool, constipation and melena.  Genitourinary:  Positive for dysuria. Negative for  flank pain and frequency.  Musculoskeletal:  Negative for back pain and neck pain.  Skin:  Negative for itching.  Neurological:  Positive for dizziness and seizures. Negative for speech change and loss of consciousness.  Psychiatric/Behavioral:  Positive for substance abuse.     Past Medical History:  Diagnosis Date   Arthritis    Enlarged prostate    GERD (gastroesophageal reflux disease)    Gout    Patient doesn't recall having gout but was prescribed allopurinol  for ankle swelling.   Hyperlipidemia    Hypertension    Prostate cancer [facility]) June 2015   PTSD (post-traumatic stress disorder)    Syncope    Negative outpatient workup.   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 His smoking use included cigarettes. He has never used smokeless tobacco. He reports current alcohol use. He reports that he does not use drugs.  Allergies  Allergen Reactions   Viagra [Sildenafil] Nausea And Vomiting   Ciprofloxacin Dermatitis and Rash   Spironolactone Rash   Vitamin D Analogs Rash  Family History  Problem Relation Age of Onset   Cancer Father     Prior to Admission medications   Medication Sig Start Date End Date Taking? Authorizing Provider  allopurinol  (ZYLOPRIM ) 100 MG tablet Take 100 mg by mouth daily. 06/01/22   [provider]  alum & mag hydroxide-simeth (MAALOX/MYLANTA) 200-200-20  MG/5ML suspension Take 30 mLs by mouth every 4 (four) hours as needed for indigestion. 02/24/23   Aura Leeds Latif, DO  apixaban  (ELIQUIS ) 5 MG TABS tablet Take 1 tablet (5 mg total) by mouth 2 (two) times daily. 02/24/23   Sheikh, Omair Latif, DO  colchicine  0.6 MG tablet Take 0.6 mg by mouth daily as needed (gout). 05/08/20   [provider]  cyanocobalamin  1000 MCG tablet Take 1 tablet (1,000 mcg total) by mouth daily. 02/25/23   Aura Leeds Latif, DO  diltiazem  (CARDIZEM  CD) 120 MG 24 hr capsule Take 1 capsule (120 mg total) by mouth daily. 02/25/23   Aura Leeds Latif, DO  folic acid  (FOLVITE ) 1 MG tablet Take 1 tablet (1 mg total) by mouth daily. 02/25/23   Sheikh, Omair Latif, DO  furosemide (LASIX) 40 MG tablet Take 40 mg by mouth daily as needed for fluid or edema.    [provider]  gabapentin  (NEURONTIN ) 100 MG capsule Take 100 mg by mouth daily as needed (neuropathy).    [provider]  levETIRAcetam  (KEPPRA ) 250 MG tablet Take 5 tablets (1,250 mg total) by mouth 2 (two) times daily. 02/24/23   Aura Leeds Latif, DO  magnesium  oxide (MAG-OX) 400 (241.3 Mg) MG tablet Take 400 mg by mouth every evening.    [provider]  Multiple Vitamin (MULTIVITAMIN WITH MINERALS) TABS tablet Take 1 tablet by mouth daily.    [provider]  omeprazole  (PRILOSEC) 20 MG capsule Take 2 capsules (40 mg total) by mouth 2 (two) times daily before a meal. 12/24/18   Macdonald Savoy, MD  ondansetron  (ZOFRAN ) 4 MG tablet Take 1 tablet (4 mg total) by mouth every 6 (six) hours as needed for nausea. 02/24/23   Aura Leeds Latif, DO  oxyCODONE  (OXY IR/ROXICODONE ) 5 MG immediate release tablet Take 1 tablet (5 mg total) by mouth every 6 (six) hours as needed for moderate pain (pain score 4-6) (pain score 4-6). 02/24/23   Sheikh, Omair Latif, DO  polyethylene glycol (MIRALAX  / GLYCOLAX ) 17 g packet Take 17 g by mouth 2 (two) times daily. 02/24/23   Sheikh,  Omair Latif, DO  senna (SENOKOT) 8.6 MG TABS tablet Take 2 tablets (17.2 mg total) by mouth at bedtime. 02/24/23   Aura Leeds Latif, DO  thiamine  (VITAMIN B-1) 100 MG tablet Take 1 tablet (100 mg total) by mouth daily. 02/25/23   Aura Leeds [place], DO    Physical Exam: Vitals:   08/24/23 1200 08/24/23 1514 08/24/23 1529 08/24/23 1530  BP: 130/83 (!) 147/87 (!) 129/91 (!) 129/91  Pulse: 100 (!) 106  (!) 103  Resp: 20 (!) 25  (!) 23  Temp:  99.4 F (37.4 C)    TempSrc:  Oral    SpO2: 95% 96%  95%  Physical Exam Vitals and nursing note reviewed.  Constitutional:      General: He is not in acute distress.    Appearance: He is not ill-appearing or toxic-appearing.  HENT:     Head: Normocephalic and atraumatic.     Mouth/Throat:     Mouth: Mucous membranes are moist.  Eyes:     General:  No scleral icterus.    Extraocular Movements: Extraocular movements intact.     Pupils: Pupils are equal, round, and reactive to light.  Cardiovascular:     Rate and Rhythm: Tachycardia present. Rhythm irregular.  Pulmonary:     Effort: Pulmonary effort is normal. No respiratory distress.     Breath sounds: Rhonchi present. No wheezing.  Abdominal:     General: There is no distension.     Palpations: Abdomen is soft.     Tenderness: There is no abdominal tenderness.  Musculoskeletal:     Cervical back: Normal range of motion and neck supple.     Right lower leg: No edema.     Left lower leg: No edema.  Skin:    General: Skin is warm and dry.  Neurological:     General: No focal deficit present.     Mental Status: He is alert. Mental status is at baseline.  Psychiatric:        Mood and Affect: Mood normal.        Behavior: Behavior normal.     Data Reviewed:    Labs on Admission: I have personally reviewed following labs and imaging studies  CBC: Recent Labs  Lab 08/24/23 1225  WBC 4.0  HGB 15.2  HCT 43.6  MCV 99.1  PLT 84*   Basic Metabolic Panel: Recent Labs  Lab  08/24/23 1322  NA 135  K 3.7  CL 96*  CO2 22  GLUCOSE 95  BUN 12  CREATININE 0.90  CALCIUM  9.2   GFR: CrCl cannot be calculated (Unknown ideal weight.). Liver Function Tests: Recent Labs  Lab 08/24/23 1322  AST 128*  ALT 43  ALKPHOS 201*  BILITOT 1.9*  PROT 9.1*  ALBUMIN 4.2   No results for input(s): LIPASE, AMYLASE in the last 168 hours. No results for input(s): AMMONIA in the last 168 hours. Coagulation Profile: Recent Labs  Lab 08/24/23 1322  INR 1.0   Cardiac Enzymes: No results for input(s): CKTOTAL, CKMB, CKMBINDEX, TROPONINI in the last 168 hours. BNP (last 3 results) No results for input(s): PROBNP in the last 8760 hours. HbA1C: No results for input(s): HGBA1C in the last 72 hours. CBG: No results for input(s): GLUCAP in the last 168 hours. Lipid Profile: No results for input(s): CHOL, HDL, LDLCALC, TRIG, CHOLHDL, LDLDIRECT in the last 72 hours. Thyroid Function Tests: No results for input(s): TSH, T4TOTAL, FREET4, T3FREE, THYROIDAB in the last 72 hours. Anemia Panel: No results for input(s): VITAMINB12, FOLATE, FERRITIN, TIBC, IRON, RETICCTPCT in the last 72 hours. Urine analysis:    Component Value Date/Time   COLORURINE AMBER (A) 08/24/2023 1229   APPEARANCEUR CLOUDY (A) 08/24/2023 1229   LABSPEC 1.020 08/24/2023 1229   PHURINE 5.0 08/24/2023 1229   GLUCOSEU NEGATIVE 08/24/2023 1229   HGBUR MODERATE (A) 08/24/2023 1229   BILIRUBINUR NEGATIVE 08/24/2023 1229   KETONESUR 20 (A) 08/24/2023 1229   PROTEINUR >=300 (A) 08/24/2023 1229   UROBILINOGEN 2.0 (H) 03/28/2014 1624   NITRITE NEGATIVE 08/24/2023 1229   LEUKOCYTESUR LARGE (A) 08/24/2023 1229    Radiological Exams on Admission: DG Chest Portable 1 View Result Date: 08/24/2023 CLINICAL DATA:  Cough EXAM: PORTABLE CHEST 1 VIEW COMPARISON:  X-ray 02/17/2023. FINDINGS: No consolidation, pneumothorax or effusion. No edema. Normal  cardiopericardial silhouette. Film is under penetrated. Degenerative changes along the spine. 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 IMPRESSION: Under penetrated radiograph. Grossly no acute cardiopulmonary disease. Electronically Signed   By:  Adrianna Horde M.D.   On: 08/24/2023 12:18       Assessment and Plan: No notes have been filed under this hospital service. Service: Hospitalist 78 y.o. male with medical history significant of PAF on eliquis , chronic ETOH abuse, HTN, HLD, seizures, recurrent UTI, presents from home for evaluation of worsening non-productive cough,SOB, and dysuria for the past few weeks after being around family members with URI's.  Met SIRS criteria with tachycardia (in afib) and tachypnea, UTI, admitted to hospitalist service for further management.   Sepsis (with tachycardia [afib], tachypnea) Lactic acidosis  UTI - hemodynamically stable, with uptrending lactic in setting of abnormal LFT's.  Although endorses respiratory symptoms CXR clear, saturating appropriately on RA, COVID/Flu/RSV, troponin negative.  - nebs, robitussin.  - rocephin  pending urine culture. No flank pain  - blood culture pending - s/p 2L bolus in ED, continue IVF  and trend lactic   Paroxysmal afib with RVR - admits did not take cardizem  prior to presentation, rate currently controlled  - continue cardizem  and eliquis    Transaminitis  Chronic alcohol abuse  Thrombocytopenia - LFT's near baseline, denies GI complaints and bleeding.  - monitor for evidence of withdrawal, none currently  - encourage ETOH cessation  - continue supplements  - monitor blood counts  Hx seizure disorder  - seizure precautions  - caution with trazodone , continue for now   HTN  HLD  Chronic opioids (PDMP reviewed) - continue home medications   Eliquis   Heart healthy diet  LR@75  FULL Code- confirmed with patient and family member at beside at time of admission.   Monitor/replace electrolytes    Advance Care Planning:   Code Status: Full Code      Severity of Illness: The appropriate patient status for this patient is OBSERVATION. Observation status is judged to be reasonable and necessary in order to provide the required intensity of service to ensure the patient's safety. The patient's presenting symptoms, physical exam findings, and initial radiographic and laboratory data in the context of their medical condition is felt to place them at decreased risk for further clinical deterioration. Furthermore, it is anticipated that the patient will be medically stable for discharge from the hospital within 2 midnights of admission.   Author: Charlesetta Connors, DO 08/24/2023 5:13 PM  For on call review [email or web address].

## 2023-08-25 DIAGNOSIS — I480 Paroxysmal atrial fibrillation: Secondary | ICD-10-CM | POA: Diagnosis present

## 2023-08-25 DIAGNOSIS — E872 Acidosis, unspecified: Secondary | ICD-10-CM | POA: Diagnosis present

## 2023-08-25 DIAGNOSIS — A419 Sepsis, unspecified organism: Secondary | ICD-10-CM | POA: Diagnosis not present

## 2023-08-25 DIAGNOSIS — Z888 Allergy status to other drugs, medicaments and biological substances status: Secondary | ICD-10-CM | POA: Diagnosis not present

## 2023-08-25 DIAGNOSIS — R21 Rash and other nonspecific skin eruption: Secondary | ICD-10-CM | POA: Diagnosis present

## 2023-08-25 DIAGNOSIS — D6959 Other secondary thrombocytopenia: Secondary | ICD-10-CM | POA: Diagnosis present

## 2023-08-25 DIAGNOSIS — Z8546 Personal history of malignant neoplasm of prostate: Secondary | ICD-10-CM | POA: Diagnosis not present

## 2023-08-25 DIAGNOSIS — R319 Hematuria, unspecified: Secondary | ICD-10-CM | POA: Diagnosis not present

## 2023-08-25 DIAGNOSIS — Z1152 Encounter for screening for COVID-19: Secondary | ICD-10-CM | POA: Diagnosis not present

## 2023-08-25 DIAGNOSIS — Z7901 Long term (current) use of anticoagulants: Secondary | ICD-10-CM | POA: Diagnosis not present

## 2023-08-25 DIAGNOSIS — Z87440 Personal history of urinary (tract) infections: Secondary | ICD-10-CM | POA: Diagnosis not present

## 2023-08-25 DIAGNOSIS — E876 Hypokalemia: Secondary | ICD-10-CM | POA: Diagnosis not present

## 2023-08-25 DIAGNOSIS — Z96611 Presence of right artificial shoulder joint: Secondary | ICD-10-CM | POA: Diagnosis present

## 2023-08-25 DIAGNOSIS — K219 Gastro-esophageal reflux disease without esophagitis: Secondary | ICD-10-CM | POA: Diagnosis present

## 2023-08-25 DIAGNOSIS — Z881 Allergy status to other antibiotic agents status: Secondary | ICD-10-CM | POA: Diagnosis not present

## 2023-08-25 DIAGNOSIS — E785 Hyperlipidemia, unspecified: Secondary | ICD-10-CM | POA: Diagnosis present

## 2023-08-25 DIAGNOSIS — Z87891 Personal history of nicotine dependence: Secondary | ICD-10-CM | POA: Diagnosis not present

## 2023-08-25 DIAGNOSIS — G40909 Epilepsy, unspecified, not intractable, without status epilepticus: Secondary | ICD-10-CM | POA: Diagnosis present

## 2023-08-25 DIAGNOSIS — F1010 Alcohol abuse, uncomplicated: Secondary | ICD-10-CM | POA: Diagnosis present

## 2023-08-25 DIAGNOSIS — Z96642 Presence of left artificial hip joint: Secondary | ICD-10-CM | POA: Diagnosis present

## 2023-08-25 DIAGNOSIS — N390 Urinary tract infection, site not specified: Secondary | ICD-10-CM | POA: Diagnosis not present

## 2023-08-25 DIAGNOSIS — I4892 Unspecified atrial flutter: Secondary | ICD-10-CM | POA: Diagnosis present

## 2023-08-25 DIAGNOSIS — I10 Essential (primary) hypertension: Secondary | ICD-10-CM | POA: Diagnosis present

## 2023-08-25 DIAGNOSIS — N400 Enlarged prostate without lower urinary tract symptoms: Secondary | ICD-10-CM | POA: Diagnosis present

## 2023-08-25 DIAGNOSIS — Z79899 Other long term (current) drug therapy: Secondary | ICD-10-CM | POA: Diagnosis not present

## 2023-08-25 LAB — COMPREHENSIVE METABOLIC PANEL WITH GFR
ALT: 35 U/L (ref 0–44)
AST: 110 U/L — ABNORMAL HIGH (ref 15–41)
Albumin: 3.3 g/dL — ABNORMAL LOW (ref 3.5–5.0)
Alkaline Phosphatase: 162 U/L — ABNORMAL HIGH (ref 38–126)
Anion gap: 13 (ref 5–15)
BUN: 8 mg/dL (ref 8–23)
CO2: 24 mmol/L (ref 22–32)
Calcium: 8.5 mg/dL — ABNORMAL LOW (ref 8.9–10.3)
Chloride: 95 mmol/L — ABNORMAL LOW (ref 98–111)
Creatinine, Ser: 0.65 mg/dL (ref 0.61–1.24)
GFR, Estimated: >60 mL/min (ref >60)
Glucose, Bld: 96 mg/dL (ref 70–99)
Potassium: 3.1 mmol/L — ABNORMAL LOW (ref 3.5–5.1)
Sodium: 132 mmol/L — ABNORMAL LOW (ref 135–145)
Total Bilirubin: 1.5 mg/dL — ABNORMAL HIGH (ref 0.0–1.2)
Total Protein: 7.3 g/dL (ref 6.5–8.1)

## 2023-08-25 LAB — RESPIRATORY PANEL BY PCR

## 2023-08-25 LAB — CBC
HCT: 35.3 % — ABNORMAL LOW (ref 39.0–52.0)
Hemoglobin: 12.3 g/dL — ABNORMAL LOW (ref 13.0–17.0)
MCH: 33.8 pg (ref 26.0–34.0)
MCHC: 34.8 g/dL (ref 30.0–36.0)
MCV: 97 fL (ref 80.0–100.0)
Platelets: 66 10*3/uL — ABNORMAL LOW (ref 150–400)
RBC: 3.64 MIL/uL — ABNORMAL LOW (ref 4.22–5.81)
RDW: 13.5 % (ref 11.5–15.5)
WBC: 3.4 10*3/uL — ABNORMAL LOW (ref 4.0–10.5)
nRBC: 0 % (ref 0.0–0.2)

## 2023-08-25 LAB — PROTIME-INR
INR: 1.2 (ref 0.8–1.2)
Prothrombin Time: 15.5 s — ABNORMAL HIGH (ref 11.4–15.2)

## 2023-08-25 LAB — URINE CULTURE: Culture: >=100000 — AB

## 2023-08-25 MED ORDER — POTASSIUM CHLORIDE CRYS ER 20 MEQ PO TBCR
40.0000 meq | EXTENDED_RELEASE_TABLET | Freq: Once | ORAL | Status: AC
  Administered 2023-08-25: 40 meq via ORAL
  Filled 2023-08-25: qty 2

## 2023-08-25 NOTE — Evaluation (Signed)
 Physical Therapy Evaluation Patient Details Name: Steven Bradley MRN: [id number] DOB: [birth year] Today's Date: 08/25/2023  History of Present Illness  Pt is a 78 y/o M admitted on 08/24/23 after presenting with c/o worsening cough, SOB, new foul smelling urine. Pt is being treated for sepisis, UTI. Tested positive for metapneumovirus. PMH: PAF on Eliquis , seizure disorder, gout, HLD, HTN, prostate CA, syncope, PTSD  Clinical Impression  Pt seen for PT evaluation with pt agreeable to tx. Pt provides PLOF/home set up information but info not always consistent. Pt reports he lives with his wife & she cleans him in the bed, but also mentions walking to the bathroom; also states he performs transfers to/from w/c only. Pt reports he & wife are managing okay at home. On this date, pt declines OOB mobility 2/2 diarrhea. Pt rolls L<>R with mod I with use of bed rails to allow PT to perform peri hygiene. Will continue to follow pt acutely to progress OOB mobility as able.         If plan is discharge home, recommend the following: A lot of help with walking and/or transfers;A lot of help with bathing/dressing/bathroom;Assistance with cooking/housework;Help with stairs or ramp for entrance   Can travel by private vehicle        Equipment Recommendations None recommended by PT  Recommendations for Other Services       Functional Status Assessment Patient has had a recent decline in their functional status and demonstrates the ability to make significant improvements in function in a reasonable and predictable amount of time.     Precautions / Restrictions Precautions Precautions: Fall Restrictions Weight Bearing Restrictions Per Provider Order: No      Mobility  Bed Mobility Overal bed mobility: Needs Assistance Bed Mobility: Rolling Rolling: Modified independent (Device/Increase time), Used rails (rolled L<>R)              Transfers                         Ambulation/Gait                  Stairs            Wheelchair Mobility     Tilt Bed    Modified Rankin (Stroke Patients Only)       Balance                                             Pertinent Vitals/Pain Pain Assessment Pain Assessment: 0-10 Pain Score: 5  Pain Location: LLE, R shoulder Pain Descriptors / Indicators: Throbbing Pain Intervention(s): Monitored during session    Home Living Family/patient expects to be discharged to:: Private residence Living Arrangements: Spouse/significant other Available Help at Discharge: Family;Available 24 hours/day Type of Home: House Home Access: Ramped entrance     Alternate Level Stairs-Number of Steps: chair lift to go to bedroom Home Layout: Multi-level Home Equipment: Shower seat;BSC/3in1;Wheelchair - Forensic psychologist (2 wheels);Cane - single point;Rollator (4 wheels)      Prior Function Prior Level of Function : Needs assist             Mobility Comments: Wife assists with getting OOB, transfers. Hasn't walked lately. Notes 1 fall 2-3 months ago & broke his hip & shoulder. Gets OOB daily to w/c, recliner. ADLs Comments: Wife assists with sponge bathing at  bed level, assists with dressing depending on how much energy he has.     Extremity/Trunk Assessment   Upper Extremity Assessment Upper Extremity Assessment: Generalized weakness    Lower Extremity Assessment Lower Extremity Assessment: Generalized weakness       Communication   Communication Communication: Impaired Factors Affecting Communication: Hearing impaired    Cognition Arousal: Alert Behavior During Therapy: WFL for tasks assessed/performed   PT - Cognitive impairments: No family/caregiver present to determine baseline                       PT - Cognition Comments: AxOx4 but does not provide consistent PLOF Following commands: Intact       Cueing Cueing Techniques: Verbal cues      General Comments General comments (skin integrity, edema, etc.): Pt voided 2x during session, very small amount each time.    Exercises     Assessment/Plan    PT Assessment Patient needs continued PT services  PT Problem List Decreased strength;Decreased activity tolerance;Decreased balance;Decreased mobility;Decreased cognition;Pain;Decreased knowledge of use of DME       PT Treatment Interventions DME instruction;Balance training;Gait training;Neuromuscular re-education;Stair training;Functional mobility training;Patient/family education;Therapeutic activities;Therapeutic exercise    PT Goals (Current goals can be found in the Care Plan section)  Acute Rehab PT Goals Patient Stated Goal: get better PT Goal Formulation: With patient Time For Goal Achievement: 09/08/23 Potential to Achieve Goals: Good    Frequency Min 2X/week     Co-evaluation               AM-PAC PT 6 Clicks Mobility  Outcome Measure Help needed turning from your back to your side while in a flat bed without using bedrails?: None Help needed moving from lying on your back to sitting on the side of a flat bed without using bedrails?: A Little Help needed moving to and from a bed to a chair (including a wheelchair)?: A Lot Help needed standing up from a chair using your arms (e.g., wheelchair or bedside chair)?: A Lot Help needed to walk in hospital room?: Total Help needed climbing 3-5 steps with a railing? : Total 6 Click Score: 13    End of Session   Activity Tolerance:  (limited 2/2 diarrhea) Patient left: in bed;with call bell/phone within reach;with bed alarm set Nurse Communication: Mobility status PT Visit Diagnosis: Muscle weakness (generalized) (M62.81);Other abnormalities of gait and mobility (R26.89);Difficulty in walking, not elsewhere classified (R26.2)    Time: 1610-9604 PT Time Calculation (min) (ACUTE ONLY): 16 min   Charges:   PT Evaluation $PT Eval Moderate Complexity: 1  Mod   PT General Charges $$ ACUTE PT VISIT: 1 Visit         Emaline Handsome, PT, DPT 08/25/23, 3:02 PM   Steven Bradley 08/25/2023, 3:00 PM

## 2023-08-25 NOTE — Progress Notes (Signed)
 PROGRESS NOTE    Steven Bradley  ZOX:[id number] DOB: 06/04/1945 DOA: 08/24/2023 PCP: Royetta Cork, MD    Brief Narrative:  Steven Bradley is a 78 y.o. male with medical history significant of PAF on eliquis   Presents from home for evaluation of worsening non-productive cough and SOB for the past few weeks, with new foul smelling urine for the past few days with dysuria. Reports associated fever, chills, dizziness, nausea/vomiting/diarrhea, and dysuria. No syncope, hematuria, hemoptysis, leg or abdominal swelling, urinary frequency, abdominal or flank pain.  Reports multiple family members with similar syndromes which have improved on antibiotics.  Reports drinks 4 gin drinks daily, last drink being last night. Reports mild withdrawal tremors in the past.    Assessment and Plan: Sepsis (with tachycardia [afib], tachypnea) Lactic acidosis  UTI - hemodynamically stable, with uptrending lactic in setting of abnormal LFT's -NP swab - nebs, robitussin.  - rocephin  pending urine culture. - blood culture pending - s/p 2L bolus in ED, continue IVF  and trend lactic    Paroxysmal afib with RVR - did not take cardizem  prior to presentation, rate currently controlled  - continue cardizem  and eliquis   -monitor on tele   Transaminitis  Chronic alcohol abuse  Thrombocytopenia - LFT's near baseline, denies GI complaints and bleeding.  - monitor for evidence of withdrawal, none currently  - encourage ETOH cessation  - continue supplements  - monitor blood counts   Hx seizure disorder  - seizure precautions  - caution with trazodone , continue for now    HTN  HLD  Chronic opioids (PDMP reviewed) - continue home medications    Hypokalemia Replete   DVT prophylaxis:  apixaban  (ELIQUIS ) tablet 5 mg    Code Status: Full Code Family Communication: At bedside  Disposition Plan:  Level of care: Telemetry Status is: Observation     Consultants:  None  Subjective: Has not been  very active at home per wife  Objective: Vitals:   08/24/23 2230 08/25/23 0238 08/25/23 0618 08/25/23 0958  BP: 139/87 (!) 145/96 126/74 127/77  Pulse: 87 (!) 101 82   Resp: 18 19 18    Temp: 98.9 F (37.2 C) 98.9 F (37.2 C) 98.1 F (36.7 C)   TempSrc:      SpO2: 99% 98% 96%   Weight:      Height:        Intake/Output Summary (Last 24 hours) at 08/25/2023 1120 Last data filed at 08/25/2023 0831 Gross per 24 hour  Intake 120 ml  Output 650 ml  Net -530 ml   Filed Weights   08/24/23 2203  Weight: 89.7 kg    Examination:   General: Appearance:  Elderly male in no acute distress     Lungs:      respirations unlabored  Heart:    Normal heart rate.    MS:   All extremities are intact.    Neurologic: Flat affect       Data Reviewed: I have personally reviewed following labs and imaging studies  CBC: Recent Labs  Lab 08/24/23 1225 08/25/23 0537  WBC 4.0 3.4*  HGB 15.2 12.3*  HCT 43.6 35.3*  MCV 99.1 97.0  PLT 84* 66*   Basic Metabolic Panel: Recent Labs  Lab 08/24/23 1322 08/25/23 0537  NA 135 132*  K 3.7 3.1*  CL 96* 95*  CO2 22 24  GLUCOSE 95 96  BUN 12 8  CREATININE 0.90 0.65  CALCIUM  9.2 8.5*   GFR: Estimated Creatinine Clearance: 99.7  mL/min (by C-G formula based on SCr of 0.65 mg/dL). Liver Function Tests: Recent Labs  Lab 08/24/23 1322 08/25/23 0537  AST 128* 110*  ALT 43 35  ALKPHOS 201* 162*  BILITOT 1.9* 1.5*  PROT 9.1* 7.3  ALBUMIN 4.2 3.3*   No results for input(s): LIPASE, AMYLASE in the last 168 hours. No results for input(s): AMMONIA in the last 168 hours. Coagulation Profile: Recent Labs  Lab 08/24/23 1322 08/25/23 0537  INR 1.0 1.2   Cardiac Enzymes: No results for input(s): CKTOTAL, CKMB, CKMBINDEX, TROPONINI in the last 168 hours. BNP (last 3 results) No results for input(s): PROBNP in the last 8760 hours. HbA1C: No results for input(s): HGBA1C in the last 72 hours. CBG: No results for  input(s): GLUCAP in the last 168 hours. Lipid Profile: No results for input(s): CHOL, HDL, LDLCALC, TRIG, CHOLHDL, LDLDIRECT in the last 72 hours. Thyroid Function Tests: Recent Labs    08/24/23 1900  TSH 1.792   Anemia Panel: No results for input(s): VITAMINB12, FOLATE, FERRITIN, TIBC, IRON, RETICCTPCT in the last 72 hours. Sepsis Labs: Recent Labs  Lab 08/24/23 1231 08/24/23 1449 08/24/23 1720  LATICACIDVEN 2.5* 3.5* 2.5*    Recent Results (from the past 240 hours)  Resp panel by RT-PCR (RSV, Flu A&B, Covid) Anterior Nasal Swab     Status: None   Collection Time: 08/24/23 12:25 PM   Specimen: Anterior Nasal Swab  Result Value Ref Range Status   SARS Coronavirus 2 by RT PCR NEGATIVE NEGATIVE Final    Comment: (NOTE) SARS-CoV-2 target nucleic acids are NOT DETECTED.  The SARS-CoV-2 RNA is generally detectable in upper respiratory specimens during the acute phase of infection. The lowest concentration of SARS-CoV-2 viral copies this assay can detect is 138 copies/mL. A negative result does not preclude SARS-Cov-2 infection and should not be used as the sole basis for treatment or other patient management decisions. A negative result may occur with  improper specimen collection/handling, submission of specimen other than nasopharyngeal swab, presence of viral mutation(s) within the areas targeted by this assay, and inadequate number of viral copies(<138 copies/mL). A negative result must be combined with clinical observations, patient history, and epidemiological information. The expected result is Negative.  Fact Sheet for Patients:  BloggerCourse.com  Fact Sheet for Healthcare Providers:  SeriousBroker.it  This test is no t yet approved or cleared by the [place]  FDA and  has been authorized for detection and/or diagnosis of SARS-CoV-2 by FDA under an Emergency Use Authorization (EUA).  This EUA will remain  in effect (meaning this test can be used) for the duration of the COVID-19 declaration under Section 564(b)(1) of the Act, 21 U.S.C.section 360bbb-3(b)(1), unless the authorization is terminated  or revoked sooner.       Influenza A by PCR NEGATIVE NEGATIVE Final   Influenza B by PCR NEGATIVE NEGATIVE Final    Comment: (NOTE) The Xpert Xpress SARS-CoV-2/FLU/RSV plus assay is intended as an aid in the diagnosis of influenza from Nasopharyngeal swab specimens and should not be used as a sole basis for treatment. Nasal washings and aspirates are unacceptable for Xpert Xpress SARS-CoV-2/FLU/RSV testing.  Fact Sheet for Patients: BloggerCourse.com  Fact Sheet for Healthcare Providers: SeriousBroker.it  This test is not yet approved or cleared by the [place]  FDA and has been authorized for detection and/or diagnosis of SARS-CoV-2 by FDA under an Emergency Use Authorization (EUA). This EUA will remain in effect (meaning this test can be used) for the duration of the  COVID-19 declaration under Section 564(b)(1) of the Act, 21 U.S.C. section 360bbb-3(b)(1), unless the authorization is terminated or revoked.     Resp Syncytial Virus by PCR NEGATIVE NEGATIVE Final    Comment: (NOTE) Fact Sheet for Patients: BloggerCourse.com  Fact Sheet for Healthcare Providers: SeriousBroker.it  This test is not yet approved or cleared by the [place]  FDA and has been authorized for detection and/or diagnosis of SARS-CoV-2 by FDA under an Emergency Use Authorization (EUA). This EUA will remain in effect (meaning this test can be used) for the duration of the COVID-19 declaration under Section 564(b)(1) of the Act, 21 U.S.C. section 360bbb-3(b)(1), unless the authorization is terminated or revoked.  Performed at [facility], [address], [place], [place] [address]   Culture, blood (routine x 2)     Status: None (Preliminary result)   Collection Time: 08/24/23 12:25 PM   Specimen: BLOOD  Result Value Ref Range Status   Specimen Description   Final    BLOOD SITE NOT SPECIFIED Performed at [facility], [address], [place], [place] [address]    Special Requests   Final    BOTTLES DRAWN AEROBIC AND ANAEROBIC Blood Culture adequate volume Performed at [facility], [address], [place], [place] [address]    Culture   Final    NO GROWTH < 24 HOURS Performed at [facility], [address], [place], [place] [address]    Report Status PENDING  Incomplete  Culture, blood (routine x 2)     Status: None (Preliminary result)   Collection Time: 08/24/23  1:25 PM   Specimen: BLOOD  Result Value Ref Range Status   Specimen Description   Final    BLOOD SITE NOT SPECIFIED Performed at [facility], [address] [place], [place] [address]    Special Requests   Final    BOTTLES DRAWN AEROBIC AND ANAEROBIC Blood Culture adequate volume Performed at [facility], [address] [place], [place] [address]    Culture   Final    NO GROWTH < 12 HOURS Performed at [facility], [address] [place], [place] [address]    Report Status PENDING  Incomplete         Radiology Studies: DG Chest Portable 1 View Result Date: 08/24/2023 CLINICAL DATA:  Cough EXAM: PORTABLE CHEST 1 VIEW COMPARISON:  X-ray 02/17/2023. FINDINGS: No consolidation, pneumothorax or effusion. No edema. Normal cardiopericardial silhouette. Film is under penetrated. Degenerative changes along the spine. 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 IMPRESSION: Under penetrated radiograph. Grossly no acute cardiopulmonary disease. Electronically Signed   By: Adrianna Horde M.D.   On: 08/24/2023 12:18        Scheduled Meds:   allopurinol   100 mg Oral Daily   amLODipine   5 mg Oral Daily   apixaban   5 mg Oral BID   atorvastatin   40 mg Oral Daily   cyanocobalamin   1,000 mcg Oral Daily   diltiazem   120 mg Oral Daily   folic acid   1 mg Oral Daily   levETIRAcetam   1,000 mg Oral BID   magnesium  oxide  400 mg Oral QPM   multivitamin with minerals  1 tablet Oral Daily   pantoprazole   40 mg Oral Daily   senna  2 tablet Oral QHS   sertraline   200 mg Oral Daily   thiamine   100 mg Oral Daily  Continuous Infusions:  cefTRIAXone  (ROCEPHIN )  IV     lactated ringers  75 mL/hr at 08/25/23 0722     LOS: 0 days    Time spent: 45 minutes spent on chart review, discussion with nursing staff, consultants, updating family and interview/physical exam; more than 50% of that time was spent in counseling and/or coordination of care.    Enrigue Harvard, DO [facility] Available via Epic secure chat 7am-7pm After these hours, please refer to coverage provider listed on [email or web address] 08/25/2023, 11:20 AM

## 2023-08-25 NOTE — Progress Notes (Signed)
   08/25/23 1227  TOC Brief Assessment  Insurance and Status Reviewed  Patient has primary care physician Yes  Home environment has been reviewed Home w/ spouse  Prior level of function: Independent  Prior/Current Home Services No current home services  Social Drivers of Health Review SDOH reviewed no interventions necessary  Readmission risk has been reviewed Yes  Transition of care needs no transition of care needs at this time

## 2023-08-26 ENCOUNTER — Other Ambulatory Visit (HOSPITAL_COMMUNITY): Payer: Self-pay

## 2023-08-26 DIAGNOSIS — R319 Hematuria, unspecified: Secondary | ICD-10-CM | POA: Diagnosis not present

## 2023-08-26 DIAGNOSIS — A419 Sepsis, unspecified organism: Secondary | ICD-10-CM | POA: Diagnosis not present

## 2023-08-26 DIAGNOSIS — N390 Urinary tract infection, site not specified: Secondary | ICD-10-CM | POA: Diagnosis not present

## 2023-08-26 LAB — CBC
HCT: 34 % — ABNORMAL LOW (ref 39.0–52.0)
Hemoglobin: 11.9 g/dL — ABNORMAL LOW (ref 13.0–17.0)
MCH: 34.2 pg — ABNORMAL HIGH (ref 26.0–34.0)
MCHC: 35 g/dL (ref 30.0–36.0)
MCV: 97.7 fL (ref 80.0–100.0)
Platelets: 66 10*3/uL — ABNORMAL LOW (ref 150–400)
RBC: 3.48 MIL/uL — ABNORMAL LOW (ref 4.22–5.81)
RDW: 13.1 % (ref 11.5–15.5)
WBC: 4 10*3/uL (ref 4.0–10.5)
nRBC: 0 % (ref 0.0–0.2)

## 2023-08-26 LAB — BASIC METABOLIC PANEL WITH GFR
Anion gap: 16 — ABNORMAL HIGH (ref 5–15)
BUN: 6 mg/dL — ABNORMAL LOW (ref 8–23)
CO2: 22 mmol/L (ref 22–32)
Calcium: 8.8 mg/dL — ABNORMAL LOW (ref 8.9–10.3)
Chloride: 93 mmol/L — ABNORMAL LOW (ref 98–111)
Creatinine, Ser: 0.63 mg/dL (ref 0.61–1.24)
GFR, Estimated: >60 mL/min (ref >60)
Glucose, Bld: 93 mg/dL (ref 70–99)
Potassium: 3.4 mmol/L — ABNORMAL LOW (ref 3.5–5.1)
Sodium: 131 mmol/L — ABNORMAL LOW (ref 135–145)

## 2023-08-26 MED ORDER — RISAQUAD PO CAPS
2.0000 | ORAL_CAPSULE | Freq: Every day | ORAL | Status: DC
  Administered 2023-08-26: 2 via ORAL
  Filled 2023-08-26: qty 2

## 2023-08-26 MED ORDER — LOPERAMIDE HCL 2 MG PO CAPS: 2.0000 mg | ORAL_CAPSULE | ORAL | Status: DC | PRN

## 2023-08-26 MED ORDER — LOPERAMIDE HCL 2 MG PO CAPS
2.0000 mg | ORAL_CAPSULE | ORAL | Status: AC | PRN
Start: 2023-08-26 — End: ?

## 2023-08-26 MED ORDER — CEFUROXIME AXETIL 500 MG PO TABS: 500.0000 mg | ORAL_TABLET | Freq: Two times a day (BID) | ORAL | Status: DC

## 2023-08-26 MED ORDER — CEFADROXIL 500 MG PO CAPS
1000.0000 mg | ORAL_CAPSULE | Freq: Two times a day (BID) | ORAL | Status: DC
  Administered 2023-08-26: 1000 mg via ORAL
  Filled 2023-08-26: qty 2

## 2023-08-26 MED ORDER — RISAQUAD PO CAPS
2.0000 | ORAL_CAPSULE | Freq: Every day | ORAL | 0 refills | Status: AC
  Filled 2023-08-26: qty 60, 30d supply, fill #0

## 2023-08-26 MED ORDER — CEFADROXIL 500 MG PO CAPS
1000.0000 mg | ORAL_CAPSULE | Freq: Two times a day (BID) | ORAL | 0 refills | Status: AC
  Filled 2023-08-26: qty 10, 3d supply, fill #0

## 2023-08-26 MED ORDER — POTASSIUM CHLORIDE CRYS ER 20 MEQ PO TBCR
40.0000 meq | EXTENDED_RELEASE_TABLET | Freq: Once | ORAL | Status: AC
  Administered 2023-08-26: 40 meq via ORAL
  Filled 2023-08-26: qty 2

## 2023-08-26 NOTE — Plan of Care (Signed)

## 2023-08-26 NOTE — TOC Transition Note (Signed)
 Transition of Care [facility] - Discharge Note   Patient Details  Name: Steven Bradley MRN: [id number] Date of Birth: May 09, 1945  [facility]) CM/SW Contact:  Marty Sleet, LCSW Phone Number: 08/26/2023, 10:46 AM   Clinical Narrative:    Pt recommended for HHPT. Met with pt who is agreeable to having HH arranged. Pt also interested in receiving HHOT. Pt has had HH in the past and does not currently have a HHA of preference. HHPT/OT has been arranged with [facility]. HH orders are in place. Pt has cane and rollator at home and denies further DME needs. No further TOC needs identified. TOC signing off.    Final next level of care: Home w Home Health Services Barriers to Discharge: No Barriers Identified   Patient Goals and CMS Choice Patient states their goals for this hospitalization and ongoing recovery are:: To return home CMS Medicare.gov Compare Post Acute Care list provided to:: Patient Choice offered to / list presented to : Patient [place] ownership interest in [facility].provided to::  (NA)    Discharge Placement                       Discharge Plan and Services Additional resources added to the After Visit Summary for                  DME Arranged: N/A DME Agency: NA       HH Arranged: PT, OT HH Agency: [facility] Date [facility] Agency Contacted: 08/26/23 Time HH Agency Contacted: 1046 Representative spoke with at [facility] Agency: Shelvy Dickens  Social Drivers of Health (SDOH) Interventions SDOH Screenings   Food Insecurity: No Food Insecurity (08/24/2023)  Housing: Low Risk  (08/24/2023)  Transportation Needs: No Transportation Needs (08/24/2023)  Utilities: Not At Risk (08/24/2023)  Financial Resource Strain: Low Risk  (05/05/2022)   Received from [facility]  Social Connections: Socially Integrated (08/24/2023)  Stress: No Stress Concern Present (04/29/2022)   Received from [facility]  Tobacco Use: Medium Risk (08/24/2023)      Readmission Risk Interventions    08/26/2023   10:45 AM 02/21/2023    2:03 PM 10/19/2022    2:45 PM  Readmission Risk Prevention Plan  Post Dischage Appt   Complete  Medication Screening   Complete  Transportation Screening Complete Complete Complete  PCP or Specialist Appt within 5-7 Days Complete Complete   Home Care Screening Complete Complete   Medication Review (RN CM) Complete Complete

## 2023-08-26 NOTE — Discharge Summary (Signed)
 Physician Discharge Summary  Steven Bradley:[id number] DOB: 10-07-1945 DOA: 08/24/2023  PCP: Royetta Cork, MD  Admit date: 08/24/2023 Discharge date: 08/26/2023  Admitted From: Home  Discharge disposition: Home   Recommendations for Outpatient Follow-Up:   CBC/BMP{ 1 week Home health   Discharge Diagnosis:   Principal Problem:   UTI (urinary tract infection) Active Problems:   Elevated LFTs   Acute lower UTI   Sepsis secondary to UTI [facility])   Seizure (HCC)   Hypercoagulable state due to paroxysmal atrial fibrillation [facility])    Discharge Condition: Improved.  Diet recommendation: Low sodium, heart healthy  Wound care: None.  Code status: Full.   History of Present Illness:   Steven Bradley is a 78 y.o. male with medical history significant of PAF on eliquis   Presents from home for evaluation of worsening non-productive cough and SOB for the past few weeks, with new foul smelling urine for the past few days with dysuria. Reports associated fever, chills, dizziness, nausea/vomiting/diarrhea, and dysuria. No syncope, hematuria, hemoptysis, leg or abdominal swelling, urinary frequency, abdominal or flank pain.  Reports multiple family members with similar syndromes which have improved on antibiotics.  Reports drinks 4 gin drinks daily, last drink being last night. Reports mild withdrawal tremors in the past.    In the ED, he was hemodynamically stable, without leukocytosis or fever, saturating appropriately on room air but tachypneic.  HR 120-130s in afib/flutter, . Admits did not take Cardizem  yet today. UA suggestive of UTI.  Lactic acid mildly elevated in the setting of chronic transaminitis/chronic ETOH abuse. 1V CXR unremarkable.  Hospitalist to admit for further management of sepsis 2/2 UTI, URI    Hospital Course by Problem:   Sepsis (with tachycardia [afib], tachypnea) Lactic acidosis  UTI -NP swab shows metapneumovirus-symptomatic treatment  -  rocephin  changed to p.o. antibiotics to finish treatment for E. coli - blood culture no growth to date - Patient looks well, vital signs normal-will plan home with close follow-up with PCP   Paroxysmal afib with RVR - did not take cardizem  prior to presentation, rate currently controlled  - continue cardizem  and eliquis     Transaminitis  Chronic alcohol abuse  Thrombocytopenia - LFT's near baseline, denies GI complaints and bleeding.  - No evidence of withdrawal - encourage ETOH cessation  - Follow labs outpatient after treatment of infection    Hx seizure disorder  - seizure precautions     HTN  HLD  Chronic opioids (PDMP reviewed) - continue home medications    Hypokalemia Repleted    Medical Consultants:      Discharge Exam:   Vitals:   08/25/23 1933 08/26/23 0509  BP: 137/77 122/79  Pulse: 78 79  Resp: 18 18  Temp: 98.3 F (36.8 C) 98.1 F (36.7 C)  SpO2: 96% 96%   Vitals:   08/25/23 0958 08/25/23 1231 08/25/23 1933 08/26/23 0509  BP: 127/77 131/80 137/77 122/79  Pulse:  82 78 79  Resp:   18 18  Temp:  98.2 F (36.8 C) 98.3 F (36.8 C) 98.1 F (36.7 C)  TempSrc:  Oral    SpO2:  96% 96% 96%  Weight:      Height:        General exam: Appears calm and comfortable.    The results of significant diagnostics from this hospitalization (including imaging, microbiology, ancillary and laboratory) are listed below for reference.     Procedures and Diagnostic Studies:   DG Chest Portable  1 View Result Date: 08/24/2023 CLINICAL DATA:  Cough EXAM: PORTABLE CHEST 1 VIEW COMPARISON:  X-ray 02/17/2023. FINDINGS: No consolidation, pneumothorax or effusion. No edema. Normal cardiopericardial silhouette. Film is under penetrated. Degenerative changes along the spine. 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 IMPRESSION: Under penetrated radiograph. Grossly no acute cardiopulmonary disease. Electronically Signed   By: Adrianna Horde M.D.   On: 08/24/2023 12:18     Labs:   Basic Metabolic Panel: Recent Labs  Lab 08/24/23 1322 08/25/23 0537 08/26/23 1103  NA 135 132* 131*  K 3.7 3.1* 3.4*  CL 96* 95* 93*  CO2 22 24 22   GLUCOSE 95 96 93  BUN 12 8 6*  CREATININE 0.90 0.65 0.63  CALCIUM  9.2 8.5* 8.8*   GFR Estimated Creatinine Clearance: 99.7 mL/min (by C-G formula based on SCr of 0.63 mg/dL). Liver Function Tests: Recent Labs  Lab 08/24/23 1322 08/25/23 0537  AST 128* 110*  ALT 43 35  ALKPHOS 201* 162*  BILITOT 1.9* 1.5*  PROT 9.1* 7.3  ALBUMIN 4.2 3.3*   No results for input(s): LIPASE, AMYLASE in the last 168 hours. No results for input(s): AMMONIA in the last 168 hours. Coagulation profile Recent Labs  Lab 08/24/23 1322 08/25/23 0537  INR 1.0 1.2    CBC: Recent Labs  Lab 08/24/23 1225 08/25/23 0537 08/26/23 0449  WBC 4.0 3.4* 4.0  HGB 15.2 12.3* 11.9*  HCT 43.6 35.3* 34.0*  MCV 99.1 97.0 97.7  PLT 84* 66* 66*   Cardiac Enzymes: No results for input(s): CKTOTAL, CKMB, CKMBINDEX, TROPONINI in the last 168 hours. BNP: Invalid input(s): POCBNP CBG: No results for input(s): GLUCAP in the last 168 hours. D-Dimer No results for input(s): DDIMER in the last 72 hours. Hgb A1c No results for input(s): HGBA1C in the last 72 hours. Lipid Profile No results for input(s): CHOL, HDL, LDLCALC, TRIG, CHOLHDL, LDLDIRECT in the last 72 hours. Thyroid function studies Recent Labs    08/24/23 1900  TSH 1.792   Anemia work up No results for input(s): VITAMINB12, FOLATE, FERRITIN, TIBC, IRON, RETICCTPCT in the last 72 hours. Microbiology Recent Results (from the past 240 hours)  Resp panel by RT-PCR (RSV, Flu A&B, Covid) Anterior Nasal Swab     Status: None   Collection Time: 08/24/23 12:25 PM   Specimen: Anterior Nasal Swab  Result Value Ref Range Status   SARS Coronavirus 2 by RT PCR NEGATIVE NEGATIVE Final    Comment:  (NOTE) SARS-CoV-2 target nucleic acids are NOT DETECTED.  The SARS-CoV-2 RNA is generally detectable in upper respiratory specimens during the acute phase of infection. The lowest concentration of SARS-CoV-2 viral copies this assay can detect is 138 copies/mL. A negative result does not preclude SARS-Cov-2 infection and should not be used as the sole basis for treatment or other patient management decisions. A negative result may occur with  improper specimen collection/handling, submission of specimen other than nasopharyngeal swab, presence of viral mutation(s) within the areas targeted by this assay, and inadequate number of viral copies(<138 copies/mL). A negative result must be combined with clinical observations, patient history, and epidemiological information. The expected result is Negative.  Fact Sheet for Patients:  BloggerCourse.com  Fact Sheet for Healthcare Providers:  SeriousBroker.it  This test is no t yet approved or cleared by the [place]  FDA and  has been authorized for detection and/or diagnosis of SARS-CoV-2 by FDA under an Emergency Use Authorization (EUA). This EUA will remain  in  effect (meaning this test can be used) for the duration of the COVID-19 declaration under Section 564(b)(1) of the Act, 21 U.S.C.section 360bbb-3(b)(1), unless the authorization is terminated  or revoked sooner.       Influenza A by PCR NEGATIVE NEGATIVE Final   Influenza B by PCR NEGATIVE NEGATIVE Final    Comment: (NOTE) The Xpert Xpress SARS-CoV-2/FLU/RSV plus assay is intended as an aid in the diagnosis of influenza from Nasopharyngeal swab specimens and should not be used as a sole basis for treatment. Nasal washings and aspirates are unacceptable for Xpert Xpress SARS-CoV-2/FLU/RSV testing.  Fact Sheet for Patients: BloggerCourse.com  Fact Sheet for Healthcare  Providers: SeriousBroker.it  This test is not yet approved or cleared by the [place]  FDA and has been authorized for detection and/or diagnosis of SARS-CoV-2 by FDA under an Emergency Use Authorization (EUA). This EUA will remain in effect (meaning this test can be used) for the duration of the COVID-19 declaration under Section 564(b)(1) of the Act, 21 U.S.C. section 360bbb-3(b)(1), unless the authorization is terminated or revoked.     Resp Syncytial Virus by PCR NEGATIVE NEGATIVE Final    Comment: (NOTE) Fact Sheet for Patients: BloggerCourse.com  Fact Sheet for Healthcare Providers: SeriousBroker.it  This test is not yet approved or cleared by the [place]  FDA and has been authorized for detection and/or diagnosis of SARS-CoV-2 by FDA under an Emergency Use Authorization (EUA). This EUA will remain in effect (meaning this test can be used) for the duration of the COVID-19 declaration under Section 564(b)(1) of the Act, 21 U.S.C. section 360bbb-3(b)(1), unless the authorization is terminated or revoked.  Performed at [facility], [address] [place], [place] [address]   Culture, blood (routine x 2)     Status: None (Preliminary result)   Collection Time: 08/24/23 12:25 PM   Specimen: BLOOD  Result Value Ref Range Status   Specimen Description   Final    BLOOD SITE NOT SPECIFIED Performed at [facility], [address], [place], [place] [address]    Special Requests   Final    BOTTLES DRAWN AEROBIC AND ANAEROBIC Blood Culture adequate volume Performed at [facility], [address] [place], [place] [address]    Culture   Final    NO GROWTH 2 DAYS Performed at [facility], [address] [place], [place] [address]    Report Status PENDING  Incomplete  Urine Culture     Status: Abnormal   Collection Time:  08/24/23 12:29 PM   Specimen: Urine, Random  Result Value Ref Range Status   Specimen Description   Final    URINE, RANDOM Performed at [facility], [address], [place], [place] [address]    Special Requests   Final    NONE Reflexed from [id number] Performed at [facility], [address], [place], [place] [address]    Culture >=100,000 COLONIES/mL ESCHERICHIA COLI (A)  Final   Report Status 08/26/2023 FINAL  Final   Organism ID, Bacteria ESCHERICHIA COLI (A)  Final      Susceptibility   Escherichia coli - MIC*    AMPICILLIN <=2 SENSITIVE Sensitive     CEFAZOLIN  <=4 SENSITIVE Sensitive     CEFEPIME <=0.12 SENSITIVE Sensitive     CEFTRIAXONE  <=0.25 SENSITIVE Sensitive     CIPROFLOXACIN <=0.25 SENSITIVE Sensitive     GENTAMICIN <=1 SENSITIVE Sensitive     IMIPENEM <=0.25 SENSITIVE Sensitive  NITROFURANTOIN  <=16 SENSITIVE Sensitive     TRIMETH /SULFA  <=20 SENSITIVE Sensitive     AMPICILLIN/SULBACTAM <=2 SENSITIVE Sensitive     PIP/TAZO <=4 SENSITIVE Sensitive ug/mL    * >=100,000 COLONIES/mL ESCHERICHIA COLI  Culture, blood (routine x 2)     Status: None (Preliminary result)   Collection Time: 08/24/23  1:25 PM   Specimen: BLOOD  Result Value Ref Range Status   Specimen Description   Final    BLOOD SITE NOT SPECIFIED Performed at [facility], [address], [place], [place] [address]    Special Requests   Final    BOTTLES DRAWN AEROBIC AND ANAEROBIC Blood Culture adequate volume Performed at [facility], [address] [place], [place] [address]    Culture   Final    NO GROWTH 2 DAYS Performed at [facility], [address] [place], [place] [address]    Report Status PENDING  Incomplete  Respiratory (~20 pathogens) panel by PCR     Status: Abnormal   Collection Time: 08/25/23  8:21 AM   Specimen: Nasopharyngeal Swab; Respiratory  Result Value Ref Range Status   Adenovirus NOT  DETECTED NOT DETECTED Final   Coronavirus 229E NOT DETECTED NOT DETECTED Final    Comment: (NOTE) The Coronavirus on the Respiratory Panel, DOES NOT test for the novel  Coronavirus (2019 nCoV)    Coronavirus HKU1 NOT DETECTED NOT DETECTED Final   Coronavirus NL63 NOT DETECTED NOT DETECTED Final   Coronavirus OC43 NOT DETECTED NOT DETECTED Final   Metapneumovirus DETECTED (A) NOT DETECTED Final   Rhinovirus / Enterovirus NOT DETECTED NOT DETECTED Final   Influenza A NOT DETECTED NOT DETECTED Final   Influenza B NOT DETECTED NOT DETECTED Final   Parainfluenza Virus 1 NOT DETECTED NOT DETECTED Final   Parainfluenza Virus 2 NOT DETECTED NOT DETECTED Final   Parainfluenza Virus 3 NOT DETECTED NOT DETECTED Final   Parainfluenza Virus 4 NOT DETECTED NOT DETECTED Final   Respiratory Syncytial Virus NOT DETECTED NOT DETECTED Final   Bordetella pertussis NOT DETECTED NOT DETECTED Final   Bordetella Parapertussis NOT DETECTED NOT DETECTED Final   Chlamydophila pneumoniae NOT DETECTED NOT DETECTED Final   Mycoplasma pneumoniae NOT DETECTED NOT DETECTED Final    Comment: Performed at [facility], [address] [place], [place] [address]     Discharge Instructions:   Discharge Instructions     Diet - low sodium heart healthy   Complete by: As directed    Discharge instructions   Complete by: As directed    Home health Cbc 1 week   Increase activity slowly   Complete by: As directed       Allergies as of 08/26/2023       Reactions   Viagra [sildenafil] Nausea And Vomiting   Ciprofloxacin Dermatitis, Rash   Spironolactone Rash   Vitamin D Analogs Rash        Medication List     TAKE these medications    acetaminophen  500 MG tablet Commonly known as: TYLENOL  Take 1,000 mg by mouth every 6 (six) hours as needed for moderate pain (pain score 4-6).   acidophilus Caps capsule Take 2 capsules by mouth daily. Start taking on: August 27, 2023   allopurinol  100 MG  tablet Commonly known as: ZYLOPRIM  Take 100 mg by mouth daily.   alum & mag hydroxide-simeth 200-200-20 MG/5ML suspension Commonly known as: MAALOX/MYLANTA Take 30 mLs by mouth every 4 (four) hours as needed  for indigestion.   amLODipine  5 MG tablet Commonly known as: NORVASC  Take 5 mg by mouth daily.   apixaban  5 MG Tabs tablet Commonly known as: ELIQUIS  Take 1 tablet (5 mg total) by mouth 2 (two) times daily.   atorvastatin  40 MG tablet Commonly known as: LIPITOR  Take 40 mg by mouth daily.   capsaicin 0.025 % cream Commonly known as: ZOSTRIX Apply 1 Application topically 2 (two) times daily as needed (joint pain).   cefadroxil 500 MG capsule Commonly known as: DURICEF Take 2 capsules (1,000 mg total) by mouth 2 (two) times daily.   colchicine  0.6 MG tablet Take 0.6 mg by mouth daily as needed (gout).   cyanocobalamin  1000 MCG tablet Take 1 tablet (1,000 mcg total) by mouth daily.   diclofenac  Sodium 1 % Gel Commonly known as: VOLTAREN  Apply 2 g topically 3 (three) times daily as needed (neck/hand pain).   diltiazem  120 MG 24 hr capsule Commonly known as: CARDIZEM  CD Take 1 capsule (120 mg total) by mouth daily.   folic acid  1 MG tablet Commonly known as: FOLVITE  Take 1 tablet (1 mg total) by mouth daily.   furosemide 40 MG tablet Commonly known as: LASIX Take 40 mg by mouth daily as needed for fluid or edema.   gabapentin  300 MG capsule Commonly known as: NEURONTIN  Take 300 mg by mouth 3 (three) times daily as needed (neuropathy).   HYDROcodone -acetaminophen  5-325 MG tablet Commonly known as: NORCO/VICODIN Take 1 tablet by mouth 2 (two) times daily as needed for severe pain (pain score 7-10).   levETIRAcetam  1000 MG tablet Commonly known as: KEPPRA  Take 1,000 mg by mouth 2 (two) times daily.   loperamide 2 MG capsule Commonly known as: IMODIUM Take 1 capsule (2 mg total) by mouth as needed for diarrhea or loose stools.   loratadine  10 MG  tablet Commonly known as: CLARITIN  Take 10 mg by mouth daily as needed for allergies.   magnesium  oxide 400 (241.3 Mg) MG tablet Commonly known as: MAG-OX Take 400 mg by mouth every evening.   multivitamin with minerals Tabs tablet Take 1 tablet by mouth daily.   naloxone  4 MG/0.1ML Liqd nasal spray kit Commonly known as: NARCAN  Place 0.4 mg into the nose once. SPRAY 1 SPRAY INTO ONE NOSTRIL AS DIRECTED AS NEEDED FOR RESCUE FOR OPIOID OVERDOSE - CALL 911 IMMEDIATELY, ADMINISTER DOSE, THEN TURN PERSON ON SIDE - IF NO RESPONSE IN 2-3 MINUTES OR PERSON RESPONDS BUT RELAPSES, REPEAT USING A NEW SPRAY DEVICE AND SPRAY INTO THE OTHER NOSTRIL   omeprazole  20 MG capsule Commonly known as: PRILOSEC Take 2 capsules (40 mg total) by mouth 2 (two) times daily before a meal.   ondansetron  4 MG tablet Commonly known as: ZOFRAN  Take 1 tablet (4 mg total) by mouth every 6 (six) hours as needed for nausea.   polyethylene glycol 17 g packet Commonly known as: MIRALAX  / GLYCOLAX  Take 17 g by mouth 2 (two) times daily. What changed:  when to take this reasons to take this   senna 8.6 MG Tabs tablet Commonly known as: SENOKOT Take 2 tablets (17.2 mg total) by mouth at bedtime.   sertraline  100 MG tablet Commonly known as: ZOLOFT  Take 200 mg by mouth daily.   tadalafil 5 MG tablet Commonly known as: CIALIS Take 5 mg by mouth daily as needed for erectile dysfunction.   thiamine  100 MG tablet Commonly known as: Vitamin B-1 Take 1 tablet (100 mg total) by mouth daily.   traZODone   100 MG tablet Commonly known as: DESYREL  Take 100 mg by mouth at bedtime as needed for sleep.        Follow-up Information     [facility] [place], [place] Follow up.   Why: [facility] will follow up with you at discharge to provide home health physical and occupational therapy Contact information: [address] [facility] [address] [place] [address] [phone]                   Time coordinating discharge: 45 minutes  Signed:  Enrigue Harvard DO  [facility] 08/26/2023, 12:51 PM

## 2023-08-29 LAB — CULTURE, BLOOD (ROUTINE X 2)
Culture: NO GROWTH
Culture: NO GROWTH
Special Requests: ADEQUATE
Special Requests: ADEQUATE

## 2023-09-27 ENCOUNTER — Telehealth: Payer: Self-pay | Admitting: Neurology

## 2023-09-27 NOTE — Telephone Encounter (Signed)
 Patient requested earlier appointment. Staff called patient to offer earlier appointments for July 16 and July 21 but patient could not make them.

## 2024-02-01 ENCOUNTER — Ambulatory Visit (INDEPENDENT_AMBULATORY_CARE_PROVIDER_SITE_OTHER): Admitting: Neurology

## 2024-02-01 ENCOUNTER — Encounter: Payer: Self-pay | Admitting: Neurology

## 2024-02-01 VITALS — BP 127/82 | HR 105 | Ht 78.0 in | Wt 197.6 lb

## 2024-02-01 DIAGNOSIS — G40909 Epilepsy, unspecified, not intractable, without status epilepticus: Secondary | ICD-10-CM | POA: Diagnosis not present

## 2024-02-01 MED ORDER — LEVETIRACETAM 1000 MG PO TABS: 1000.0000 mg | ORAL_TABLET | Freq: Two times a day (BID) | ORAL | 3 refills | Status: AC

## 2024-02-01 NOTE — Patient Instructions (Signed)
 Good to see you doing better! Continue Levetiracetam  (Keppra ) 1000mg  twice a day. Continue minimizing alcohol intake. Follow-up in 6 months, call for any changes.    Seizure Precautions: 1. If medication has been prescribed for you to prevent seizures, take it exactly as directed.  Do not stop taking the medicine without talking to your doctor first, even if you have not had a seizure in a long time.   2. Avoid activities in which a seizure would cause danger to yourself or to others.  Don't operate dangerous machinery, swim alone, or climb in high or dangerous places, such as on ladders, roofs, or girders.  Do not drive unless your doctor says you may.  3. If you have any warning that you may have a seizure, lay down in a safe place where you can't hurt yourself.    4.  No driving for 6 months from last seizure, as per [place]  state law.   Please refer to the following link on the Epile[employer] of [employer]'s website for more information: http://www.epilepsyfoundation.org/answerplace/Social/driving/drivingu.cfm   5.  Maintain good sleep hygiene.  6.  Contact your doctor if you have any problems that may be related to the medicine you are taking.  7.  Call 911 and bring the patient back to the ED if:        A.  The seizure lasts longer than 5 minutes.       B.  The patient doesn't awaken shortly after the seizure  C.  The patient has new problems such as difficulty seeing, speaking or moving  D.  The patient was injured during the seizure  E.  The patient has a temperature over 102 F (39C)  F.  The patient vomited and now is having trouble breathing

## 2024-02-01 NOTE — Progress Notes (Signed)
 NEUROLOGY FOLLOW UP OFFICE NOTE  Steven Bradley [id number] 03-26-1945   Discussed the use of AI scribe software for clinical note transcription with the patient, who gave verbal consent to proceed.  History of Present Illness Steven Bradley is a 78 year old male with a history of seizures who presents for follow-up for seizures. He is again accompanied by his wife who helps supplement the history today. He was last seen in the neurology clinic in April 2024, at which time he was on Keppra  1000 mg twice a day and had a normal EEG. In December 2024, he was admitted for seizures after his wife found him on the floor following a fall where he hit his face on the toilet. He was awake when found, but an hour later, he had a witnessed convulsion lasting seven to eight minutes. He was compliant with Keppra , which was subsequently increased to 1250 mg twice a day. A head CT showed no acute changes. Since then, he has had one seizure in February 2025, which was less severe, involving jerking movements but no injury. His wife reports that he has continued on the Keppra  1000mg  twice a day dose with no further seizures since then. He has reduced his alcohol intake significantly, drinking less frequently than before.  On his admission in December, he was found to be in atrial fibrillation in [facility] and started on anticoagulation. He has neuropathy and experiences numbness and tingling in his hands and feet. No recent falls, headaches, or dizziness. His sleep has improved, averaging eight hours per night, and he is able to bathe and care for himself independently.  He reports occasional mood fluctuations, feeling depressed when not feeling well, particularly when his stomach was upset for a couple of days recently. However, these symptoms resolved on their own.   History on Initial Assessment 07/08/2022: This is a 78 year old right-handed man with a history of alcoholism, alcoholic liver cirrhosis, portal  hypertensive gastropathy, ascites, prostate cancer, admitted at [facility] for 2 weeks for hematemesis and melena. He had a witnessed seizure on 04/30/22 described as a tonic-clonic seizure lasting less than 5 seconds. Glucose 139. CT head no acute changes. Per notes, he was drinking 7-8 alcoholic drinks daily (gin and water ), seizure felt most likely due to alcohol withdrawal. He was started on Levetiracetam  1000mg  BID. 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 The first occurred a year ago in his sleep, he went weeks without drinking alcohol. In August 2023 he had a seizure after stopping alcohol for a few days due to illness. He was watching TV in bed when he fell and had a convulsion lasting 5-8 minutes, he was dazed for a little while and refused to go to the ER. After the last seizure, he went to inpatient rehab and has been home for a month now. Since he got home, he had been bedridden, he has home PT and just this week started walking with assistance needed with bathing. He and his wife deny any staring/unresponsive episodes, he denies any olfactory/gustatory hallucinations, deja vu, rising epigastric sensation, focal numbness/tingling/weakness, myoclonic jerks. He has headaches every now and then. He has dizziness when standing. He has chronic neck and back pain. He has tingling and numbness in both feet. He has some constipation. No diplopia, dysarthria/dysphagia. Last fall was 10 months ago. He does not sleep well, he gets 6 hours and feels Sertraline  helps some. He has very bad mood swings, which  is not new (predated Keppra ). He has bilateral hand tremors. Since home, his wife has been monitoring alcohol, he was previously drinking 5-6 alcohol drinks daily, but now he only drinks on occasion (not daily). He does not drive.   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 He had a normal birth and early  development.  There is no history of febrile convulsions, CNS infections such as meningitis/encephalitis, or family history of seizures.  Diagnostic Data: 1-hour wake and sleep EEG in 07/2022 normal  PAST MEDICAL HISTORY: Past Medical History:  Diagnosis Date   Arthritis    Enlarged prostate    GERD (gastroesophageal reflux disease)    Gout    Patient doesn't recall having gout but was prescribed allopurinol  for ankle swelling.   Hyperlipidemia    Hypertension    Prostate cancer [facility]) June 2015   PTSD (post-traumatic stress disorder)    Syncope    Negative outpatient workup.   Vitamin D deficiency     MEDICATIONS: Current Outpatient Medications on File Prior to Visit  Medication Sig Dispense Refill   acetaminophen  (TYLENOL ) 500 MG tablet Take 1,000 mg by mouth every 6 (six) hours as needed for moderate pain (pain score 4-6).     acidophilus (RISAQUAD) CAPS capsule Take 2 capsules by mouth daily. 30 capsule 0   allopurinol  (ZYLOPRIM ) 100 MG tablet Take 100 mg by mouth daily.     alum & mag hydroxide-simeth (MAALOX/MYLANTA) 200-200-20 MG/5ML suspension Take 30 mLs by mouth every 4 (four) hours as needed for indigestion.     amLODipine  (NORVASC ) 5 MG tablet Take 5 mg by mouth daily.     apixaban  (ELIQUIS ) 5 MG TABS tablet Take 1 tablet (5 mg total) by mouth 2 (two) times daily.     atorvastatin  (LIPITOR ) 40 MG tablet Take 40 mg by mouth daily.     capsaicin (ZOSTRIX) 0.025 % cream Apply 1 Application topically 2 (two) times daily as needed (joint pain).     cefadroxil  (DURICEF) 500 MG capsule Take 2 capsules (1,000 mg total) by mouth 2 (two) times daily. 10 capsule 0   colchicine  0.6 MG tablet Take 0.6 mg by mouth daily as needed (gout).     cyanocobalamin  1000 MCG tablet Take 1 tablet (1,000 mcg total) by mouth daily.     diclofenac  Sodium (VOLTAREN ) 1 % GEL Apply 2 g topically 3 (three) times daily as needed (neck/hand pain).     diltiazem  (CARDIZEM  CD) 120 MG 24 hr capsule Take 1  capsule (120 mg total) by mouth daily.     folic acid  (FOLVITE ) 1 MG tablet Take 1 tablet (1 mg total) by mouth daily.     furosemide (LASIX) 40 MG tablet Take 40 mg by mouth daily as needed for fluid or edema.     gabapentin  (NEURONTIN ) 300 MG capsule Take 300 mg by mouth 3 (three) times daily as needed (neuropathy).     HYDROcodone -acetaminophen  (NORCO/VICODIN) 5-325 MG tablet Take 1 tablet by mouth 2 (two) times daily as needed for severe pain (pain score 7-10).     levETIRAcetam  (KEPPRA ) 1000 MG tablet Take 1,000 mg by mouth 2 (two) times daily.     loperamide  (IMODIUM ) 2 MG capsule Take 1 capsule (2 mg total) by mouth as needed for diarrhea or loose stools.     loratadine  (CLARITIN ) 10 MG tablet Take 10 mg by mouth daily as needed for allergies.  magnesium  oxide (MAG-OX) 400 (241.3 Mg) MG tablet Take 400 mg by mouth every evening.     Multiple Vitamin (MULTIVITAMIN WITH MINERALS) TABS tablet Take 1 tablet by mouth daily.     naloxone  (NARCAN ) nasal spray 4 mg/0.1 mL Place 0.4 mg into the nose once. SPRAY 1 SPRAY INTO ONE NOSTRIL AS DIRECTED AS NEEDED FOR RESCUE FOR OPIOID OVERDOSE - CALL 911 IMMEDIATELY, ADMINISTER DOSE, THEN TURN PERSON ON SIDE - IF NO RESPONSE IN 2-3 MINUTES OR PERSON RESPONDS BUT RELAPSES, REPEAT USING A NEW SPRAY DEVICE AND SPRAY INTO THE OTHER NOSTRIL     omeprazole  (PRILOSEC) 20 MG capsule Take 2 capsules (40 mg total) by mouth 2 (two) times daily before a meal. 60 capsule 0   ondansetron  (ZOFRAN ) 4 MG tablet Take 1 tablet (4 mg total) by mouth every 6 (six) hours as needed for nausea.     polyethylene glycol (MIRALAX  / GLYCOLAX ) 17 g packet Take 17 g by mouth 2 (two) times daily. (Patient taking differently: Take 17 g by mouth daily as needed for mild constipation.)     senna (SENOKOT) 8.6 MG TABS tablet Take 2 tablets (17.2 mg total) by mouth at bedtime.     sertraline  (ZOLOFT ) 100 MG tablet Take 200 mg by mouth daily.     tadalafil (CIALIS) 5 MG tablet Take 5 mg by  mouth daily as needed for erectile dysfunction.     thiamine  (VITAMIN B-1) 100 MG tablet Take 1 tablet (100 mg total) by mouth daily.     traZODone  (DESYREL ) 100 MG tablet Take 100 mg by mouth at bedtime as needed for sleep.     No current facility-administered medications on file prior to visit.    ALLERGIES: Allergies  Allergen Reactions   Viagra [Sildenafil] Nausea And Vomiting   Ciprofloxacin Dermatitis and Rash   Spironolactone Rash   Vitamin D Analogs Rash    FAMILY HISTORY: Family History  Problem Relation Age of Onset   Cancer Father     SOCIAL HISTORY: Social History   Socioeconomic History   Marital status: Married    Spouse name: Not on file   Number of children: Not on file   Years of education: Not on file   Highest education level: Not on file  Occupational History   Not on file  Tobacco Use   Smoking status: Former    Types: Cigarettes   Smokeless tobacco: Never   Tobacco comments:    Former smoker 03/10/23  Vaping Use   Vaping status: Never Used  Substance and Sexual Activity   Alcohol use: Yes    Comment: occasionaly   Drug use: No   Sexual activity: Yes    Birth control/protection: None  Other Topics Concern   Not on file  Social History Narrative   Are you right handed or left handed? Right    Are you currently employed ? Retired    What is your current occupation? [employer]   Do you live at home alone? No    Who lives with you? Wife    What type of home do you live in: 1 story or 2 story?  1 story        Social Drivers of Corporate Investment Banker Strain: Low Risk  (05/05/2022)   Received from [facility]   Overall Financial Resource Strain (CARDIA)    Difficulty of Paying Living Expenses: Not very hard  Food Insecurity: No Food Insecurity (08/24/2023)   Hunger Vital Sign  Worried About Programme Researcher, Broadcasting/film/video in the Last Year: Never true    Ran Out of Food in the Last Year: Never true  Transportation Needs: No Transportation Needs  (08/24/2023)   PRAPARE - Administrator, Civil Service (Medical): No    Lack of Transportation (Non-Medical): No  Physical Activity: Not on file  Stress: No Stress Concern Present (04/29/2022)   Received from [facility] Occupational [facility] - Occupational Stress Questionnaire    Feeling of Stress : Only a little  Social Connections: Socially Integrated (08/24/2023)   Social Connection and Isolation Panel    Frequency of Communication with Friends and Family: More than three times a week    Frequency of Social Gatherings with Friends and Family: Three times a week    Attends Religious Services: 1 to 4 times per year    Active Member of Clubs or Organizations: Yes    Attends Banker Meetings: 1 to 4 times per year    Marital Status: Married  Catering Manager Violence: Not At Risk (08/24/2023)   Humiliation, Afraid, Rape, and Kick questionnaire    Fear of Current or Ex-Partner: No    Emotionally Abused: No    Physically Abused: No    Sexually Abused: No     PHYSICAL EXAM: Vitals:   02/01/24 0840  BP: 127/82  Pulse: (!) 105  SpO2: 99%   General: No acute distress Head:  Normocephalic/atraumatic Skin/Extremities: No rash, no edema Neurological Exam: alert and awake. No aphasia or dysarthria. Fund of knowledge is appropriate.  Attention and concentration are normal.   Cranial nerves: Pupils equal, round. Extraocular movements intact with no nystagmus. Visual fields full.  No facial asymmetry.  Motor: Bulk and tone normal, muscle strength 5/5 throughout with no pronator drift. Reflexes +2 throughout.  Finger to nose testing intact.  Gait slow and cautious with walker, no ataxia. No tremors.   Assessment & Plan This is a 78 yo RH man with a history of alcoholism, alcoholic liver cirrhosis, portal hypertensive gastropathy, ascites, prostate cancer, with recurrent seizures. Prior seizures appeared to occur in the setting of alcohol withdrawal  (although some reportedly occurred weeks from last drink). He has continued to have seizures despite alcohol reduction, last seizure was in February 2025. EEG normal, head CT no acute changes. He is doing well on Levetiracetam  1000mg  twice a day, refills sent. Continue minimizing alcohol use. He does not drive. Follow-up in 6 months, call for any changes.     Thank you for allowing me to participate in his care.  Please do not hesitate to call for any questions or concerns.    Darice Shivers, M.D.   CC: Dr. Tobie

## 2024-07-31 ENCOUNTER — Ambulatory Visit: Admitting: Neurology
